# Patient Record
Sex: Male | Born: 1970
Health system: Southern US, Community
[De-identification: ages and names within clinical notes are randomized; demographics above are authoritative.]

## PROBLEM LIST (undated history)

## (undated) DIAGNOSIS — E119 Type 2 diabetes mellitus without complications: Secondary | ICD-10-CM

## (undated) DIAGNOSIS — E785 Hyperlipidemia, unspecified: Secondary | ICD-10-CM

## (undated) DIAGNOSIS — M1990 Unspecified osteoarthritis, unspecified site: Secondary | ICD-10-CM

## (undated) DIAGNOSIS — K219 Gastro-esophageal reflux disease without esophagitis: Secondary | ICD-10-CM

## (undated) DIAGNOSIS — F419 Anxiety disorder, unspecified: Secondary | ICD-10-CM

## (undated) DIAGNOSIS — I10 Essential (primary) hypertension: Secondary | ICD-10-CM

## (undated) DIAGNOSIS — J302 Other seasonal allergic rhinitis: Secondary | ICD-10-CM

## (undated) DIAGNOSIS — E669 Obesity, unspecified: Secondary | ICD-10-CM

## (undated) HISTORY — DX: Unspecified osteoarthritis, unspecified site: M19.90

## (undated) HISTORY — PX: APPENDECTOMY: SHX54

## (undated) HISTORY — DX: Anxiety disorder, unspecified: F41.9

## (undated) HISTORY — DX: Hyperlipidemia, unspecified: E78.5

## (undated) HISTORY — DX: Essential (primary) hypertension: I10

## (undated) HISTORY — DX: Type 2 diabetes mellitus without complications: E11.9

## (undated) HISTORY — DX: Other seasonal allergic rhinitis: J30.2

---

## 2000-10-28 ENCOUNTER — Observation Stay (HOSPITAL_COMMUNITY): Admission: EM | Admit: 2000-10-28 | Discharge: 2000-10-30 | Payer: Self-pay | Admitting: Emergency Medicine

## 2004-04-24 ENCOUNTER — Emergency Department (HOSPITAL_COMMUNITY): Admission: EM | Admit: 2004-04-24 | Discharge: 2004-04-24 | Payer: Self-pay | Admitting: Emergency Medicine
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
## 2015-10-12 DIAGNOSIS — I10 Essential (primary) hypertension: Secondary | ICD-10-CM | POA: Diagnosis not present

## 2015-11-04 DIAGNOSIS — E781 Pure hyperglyceridemia: Secondary | ICD-10-CM | POA: Insufficient documentation

## 2015-11-04 DIAGNOSIS — R739 Hyperglycemia, unspecified: Secondary | ICD-10-CM | POA: Insufficient documentation

## 2015-11-04 DIAGNOSIS — J301 Allergic rhinitis due to pollen: Secondary | ICD-10-CM | POA: Insufficient documentation

## 2015-11-04 DIAGNOSIS — M545 Low back pain, unspecified: Secondary | ICD-10-CM | POA: Insufficient documentation

## 2015-11-04 DIAGNOSIS — I152 Hypertension secondary to endocrine disorders: Secondary | ICD-10-CM | POA: Insufficient documentation

## 2015-11-04 DIAGNOSIS — I10 Essential (primary) hypertension: Secondary | ICD-10-CM | POA: Insufficient documentation

## 2015-11-04 DIAGNOSIS — E1159 Type 2 diabetes mellitus with other circulatory complications: Secondary | ICD-10-CM | POA: Insufficient documentation

## 2015-11-04 DIAGNOSIS — G8929 Other chronic pain: Secondary | ICD-10-CM | POA: Insufficient documentation

## 2016-01-06 DIAGNOSIS — E781 Pure hyperglyceridemia: Secondary | ICD-10-CM | POA: Diagnosis not present

## 2016-04-14 DIAGNOSIS — E781 Pure hyperglyceridemia: Secondary | ICD-10-CM | POA: Diagnosis not present

## 2016-04-14 DIAGNOSIS — M1A9XX0 Chronic gout, unspecified, without tophus (tophi): Secondary | ICD-10-CM | POA: Diagnosis not present

## 2016-04-14 DIAGNOSIS — I10 Essential (primary) hypertension: Secondary | ICD-10-CM | POA: Diagnosis not present

## 2016-10-17 ENCOUNTER — Ambulatory Visit (INDEPENDENT_AMBULATORY_CARE_PROVIDER_SITE_OTHER): Payer: BLUE CROSS/BLUE SHIELD | Admitting: Adult Health

## 2016-10-17 ENCOUNTER — Encounter: Payer: Self-pay | Admitting: Adult Health

## 2016-10-17 VITALS — BP 127/77 | HR 88 | Ht 72.25 in | Wt 342.8 lb

## 2016-10-17 DIAGNOSIS — R739 Hyperglycemia, unspecified: Secondary | ICD-10-CM

## 2016-10-17 DIAGNOSIS — R5383 Other fatigue: Secondary | ICD-10-CM | POA: Diagnosis not present

## 2016-10-17 DIAGNOSIS — E781 Pure hyperglyceridemia: Secondary | ICD-10-CM | POA: Diagnosis not present

## 2016-10-17 DIAGNOSIS — L259 Unspecified contact dermatitis, unspecified cause: Secondary | ICD-10-CM | POA: Diagnosis not present

## 2016-10-17 DIAGNOSIS — Z1321 Encounter for screening for nutritional disorder: Secondary | ICD-10-CM

## 2016-10-17 DIAGNOSIS — I10 Essential (primary) hypertension: Secondary | ICD-10-CM | POA: Diagnosis not present

## 2016-10-17 NOTE — Patient Instructions (Signed)
Heart-Healthy Eating Plan Many factors influence your heart health, including eating and exercise habits. Heart (coronary) risk increases with abnormal blood fat (lipid) levels. Heart-healthy meal planning includes limiting unhealthy fats, increasing healthy fats, and making other small dietary changes. This includes maintaining a healthy body weight to help keep lipid levels within a normal range. What is my plan? Your health care provider recommends that you:  Get no more than _________% of the total calories in your daily diet from fat.  Limit your intake of saturated fat to less than _________% of your total calories each day.  Limit the amount of cholesterol in your diet to less than _________ mg per day. What types of fat should I choose?  Choose healthy fats more often. Choose monounsaturated and polyunsaturated fats, such as olive oil and canola oil, flaxseeds, walnuts, almonds, and seeds.  Eat more omega-3 fats. Good choices include salmon, mackerel, sardines, tuna, flaxseed oil, and ground flaxseeds. Aim to eat fish at least two times each week.  Limit saturated fats. Saturated fats are primarily found in animal products, such as meats, butter, and cream. Plant sources of saturated fats include palm oil, palm kernel oil, and coconut oil.  Avoid foods with partially hydrogenated oils in them. These contain trans fats. Examples of foods that contain trans fats are stick margarine, some tub margarines, cookies, crackers, and other baked goods. What general guidelines do I need to follow?  Check food labels carefully to identify foods with trans fats or high amounts of saturated fat.  Fill one half of your plate with vegetables and green salads. Eat 4-5 servings of vegetables per day. A serving of vegetables equals 1 cup of raw leafy vegetables,  cup of raw or cooked cut-up vegetables, or  cup of vegetable juice.  Fill one fourth of your plate with whole grains. Look for the word  "whole" as the first word in the ingredient list.  Fill one fourth of your plate with lean protein foods.  Eat 4-5 servings of fruit per day. A serving of fruit equals one medium whole fruit,  cup of dried fruit,  cup of fresh, frozen, or canned fruit, or  cup of 100% fruit juice.  Eat more foods that contain soluble fiber. Examples of foods that contain this type of fiber are apples, broccoli, carrots, beans, peas, and barley. Aim to get 20-30 g of fiber per day.  Eat more home-cooked food and less restaurant, buffet, and fast food.  Limit or avoid alcohol.  Limit foods that are high in starch and sugar.  Avoid fried foods.  Cook foods by using methods other than frying. Baking, boiling, grilling, and broiling are all great options. Other fat-reducing suggestions include:  Removing the skin from poultry.  Removing all visible fats from meats.  Skimming the fat off of stews, soups, and gravies before serving them.  Steaming vegetables in water or broth.  Lose weight if you are overweight. Losing just 5-10% of your initial body weight can help your overall health and prevent diseases such as diabetes and heart disease.  Increase your consumption of nuts, legumes, and seeds to 4-5 servings per week. One serving of dried beans or legumes equals  cup after being cooked, one serving of nuts equals 1 ounces, and one serving of seeds equals  ounce or 1 tablespoon.  You may need to monitor your salt (sodium) intake, especially if you have high blood pressure. Talk with your health care provider or dietitian to get more  information about reducing sodium. What foods can I eat? Grains   Breads, including [place], white, pita, wheat, raisin, rye, oatmeal, and [place]. Tortillas that are neither fried nor made with lard or trans fat. Low-fat rolls, including hotdog and hamburger buns and English muffins. Biscuits. Muffins. Waffles. Pancakes. Light popcorn. Whole-grain cereals. Flatbread.  Melba toast. Pretzels. Breadsticks. Rusks. Low-fat snacks and crackers, including oyster, saltine, matzo, graham, animal, and rye. Rice and pasta, including [place] rice and those that are made with whole wheat. Vegetables  All vegetables. Fruits  All fruits, but limit coconut. Meats and Other Protein Sources  Lean, well-trimmed beef, veal, pork, and lamb. Chicken and [place] without skin. All fish and shellfish. Wild duck, rabbit, pheasant, and venison. Egg whites or low-cholesterol egg substitutes. Dried beans, peas, lentils, and tofu.Seeds and most nuts. Dairy  Low-fat or nonfat cheeses, including ricotta, string, and mozzarella. Skim or 1% milk that is liquid, powdered, or evaporated. Buttermilk that is made with low-fat milk. Nonfat or low-fat yogurt. Beverages  Mineral water. Diet carbonated beverages. Sweets and Desserts  Sherbets and fruit ices. Honey, jam, marmalade, jelly, and syrups. Meringues and gelatins. Pure sugar candy, such as hard candy, jelly beans, gumdrops, mints, marshmallows, and small amounts of dark chocolate. [employer] Eat all sweets and desserts in moderation. Fats and Oils  Nonhydrogenated (trans-free) margarines. Vegetable oils, including soybean, sesame, sunflower, olive, peanut, safflower, corn, canola, and cottonseed. Salad dressings or mayonnaise that are made with a vegetable oil. Limit added fats and oils that you use for cooking, baking, salads, and as spreads. Other  Cocoa powder. Coffee and tea. All seasonings and condiments. The items listed above may not be a complete list of recommended foods or beverages. Contact your dietitian for more options.  What foods are not recommended? Grains  Breads that are made with saturated or trans fats, oils, or whole milk. Croissants. Butter rolls. Cheese breads. Sweet rolls. Donuts. Buttered popcorn. Chow mein noodles. High-fat crackers, such as cheese or butter crackers. Meats and Other Protein Sources  Fatty  meats, such as hotdogs, short ribs, sausage, spareribs, bacon, ribeye roast or steak, and mutton. High-fat deli meats, such as salami and bologna. Caviar. Domestic duck and goose. Organ meats, such as kidney, liver, sweetbreads, brains, gizzard, chitterlings, and heart. Dairy  Cream, sour cream, cream cheese, and creamed cottage cheese. Whole milk cheeses, including blue (bleu), Monterey Jack, [place], [place], American, [place], Swiss, [place], [place], and [place]. Whole or 2% milk that is liquid, evaporated, or condensed. Whole buttermilk. Cream sauce or high-fat cheese sauce. Yogurt that is made from whole milk. Beverages  Regular sodas and drinks with added sugar. Sweets and Desserts  Frosting. Pudding. Cookies. Cakes other than angel food cake. Candy that has milk chocolate or white chocolate, hydrogenated fat, butter, coconut, or unknown ingredients. Buttered syrups. Full-fat ice cream or ice cream drinks. Fats and Oils  Gravy that has suet, meat fat, or shortening. Cocoa butter, hydrogenated oils, palm oil, coconut oil, palm kernel oil. These can often be found in baked products, candy, fried foods, nondairy creamers, and whipped toppings. Solid fats and shortenings, including bacon fat, salt pork, lard, and butter. Nondairy cream substitutes, such as coffee creamers and sour cream substitutes. Salad dressings that are made of unknown oils, cheese, or sour cream. The items listed above may not be a complete list of foods and beverages to avoid. Contact your dietitian for more information.  This information is not intended to replace advice given to you by  your health care provider. Make sure you discuss any questions you have with your health care provider. Document Released: 04/04/2008 Document Revised: 01/14/2016 Document Reviewed: 12/18/2013 Elsevier Interactive Patient Education  2017 [employer]. Hypertension Hypertension, commonly called high blood pressure, is when the force of blood  pumping through the arteries is too strong. The arteries are the blood vessels that carry blood from the heart throughout the body. Hypertension forces the heart to work harder to pump blood and may cause arteries to become narrow or stiff. Having untreated or uncontrolled hypertension can cause heart attacks, strokes, kidney disease, and other problems. A blood pressure reading consists of a higher number over a lower number. Ideally, your blood pressure should be below 120/80. The first ("top") number is called the systolic pressure. It is a measure of the pressure in your arteries as your heart beats. The second ("bottom") number is called the diastolic pressure. It is a measure of the pressure in your arteries as the heart relaxes. What are the causes? The cause of this condition is not known. What increases the risk? Some risk factors for high blood pressure are under your control. Others are not. Factors you can change   Smoking.  Having type 2 diabetes mellitus, high cholesterol, or both.  Not getting enough exercise or physical activity.  Being overweight.  Having too much fat, sugar, calories, or salt (sodium) in your diet.  Drinking too much alcohol. Factors that are difficult or impossible to change   Having chronic kidney disease.  Having a family history of high blood pressure.  Age. Risk increases with age.  Race. You may be at higher risk if you are African-American.  Gender. Men are at higher risk than women before age 30. After age 66, women are at higher risk than men.  Having obstructive sleep apnea.  Stress. What are the signs or symptoms? Extremely high blood pressure (hypertensive crisis) may cause:  Headache.  Anxiety.  Shortness of breath.  Nosebleed.  Nausea and vomiting.  Severe chest pain.  Jerky movements you cannot control (seizures). How is this diagnosed? This condition is diagnosed by measuring your blood pressure while you are  seated, with your arm resting on a surface. The cuff of the blood pressure monitor will be placed directly against the skin of your upper arm at the level of your heart. It should be measured at least twice using the same arm. Certain conditions can cause a difference in blood pressure between your right and left arms. Certain factors can cause blood pressure readings to be lower or higher than normal (elevated) for a short period of time:  When your blood pressure is higher when you are in a health care provider's office than when you are at home, this is called white coat hypertension. Most people with this condition do not need medicines.  When your blood pressure is higher at home than when you are in a health care provider's office, this is called masked hypertension. Most people with this condition may need medicines to control blood pressure. If you have a high blood pressure reading during one visit or you have normal blood pressure with other risk factors:  You may be asked to return on a different day to have your blood pressure checked again.  You may be asked to monitor your blood pressure at home for 1 week or longer. If you are diagnosed with hypertension, you may have other blood or imaging tests to help your health care  provider understand your overall risk for other conditions. How is this treated? This condition is treated by making healthy lifestyle changes, such as eating healthy foods, exercising more, and reducing your alcohol intake. Your health care provider may prescribe medicine if lifestyle changes are not enough to get your blood pressure under control, and if:  Your systolic blood pressure is above 130.  Your diastolic blood pressure is above 80. Your personal target blood pressure may vary depending on your medical conditions, your age, and other factors. Follow these instructions at home: Eating and drinking   Eat a diet that is high in fiber and potassium, and  low in sodium, added sugar, and fat. An example eating plan is called the DASH (Dietary Approaches to Stop Hypertension) diet. To eat this way:  Eat plenty of fresh fruits and vegetables. Try to fill half of your plate at each meal with fruits and vegetables.  Eat whole grains, such as whole wheat pasta, brown rice, or whole grain bread. Fill about one quarter of your plate with whole grains.  Eat or drink low-fat dairy products, such as skim milk or low-fat yogurt.  Avoid fatty cuts of meat, processed or cured meats, and poultry with skin. Fill about one quarter of your plate with lean proteins, such as fish, chicken without skin, beans, eggs, and tofu.  Avoid premade and processed foods. These tend to be higher in sodium, added sugar, and fat.  Reduce your daily sodium intake. Most people with hypertension should eat less than 1,500 mg of sodium a day.  Limit alcohol intake to no more than 1 drink a day for nonpregnant women and 2 drinks a day for men. One drink equals 12 oz of beer, 5 oz of wine, or 1 oz of hard liquor. Lifestyle   Work with your health care provider to maintain a healthy body weight or to lose weight. Ask what an ideal weight is for you.  Get at least 30 minutes of exercise that causes your heart to beat faster (aerobic exercise) most days of the week. Activities may include walking, swimming, or biking.  Include exercise to strengthen your muscles (resistance exercise), such as pilates or lifting weights, as part of your weekly exercise routine. Try to do these types of exercises for 30 minutes at least 3 days a week.  Do not use any products that contain nicotine or tobacco, such as cigarettes and e-cigarettes. If you need help quitting, ask your health care provider.  Monitor your blood pressure at home as told by your health care provider.  Keep all follow-up visits as told by your health care provider. This is important. Medicines   Take over-the-counter and  prescription medicines only as told by your health care provider. Follow directions carefully. Blood pressure medicines must be taken as prescribed.  Do not skip doses of blood pressure medicine. Doing this puts you at risk for problems and can make the medicine less effective.  Ask your health care provider about side effects or reactions to medicines that you should watch for. Contact a health care provider if:  You think you are having a reaction to a medicine you are taking.  You have headaches that keep coming back (recurring).  You feel dizzy.  You have swelling in your ankles.  You have trouble with your vision. Get help right away if:  You develop a severe headache or confusion.  You have unusual weakness or numbness.  You feel faint.  You have severe  pain in your chest or abdomen.  You vomit repeatedly.  You have trouble breathing. Summary  Hypertension is when the force of blood pumping through your arteries is too strong. If this condition is not controlled, it may put you at risk for serious complications.  Your personal target blood pressure may vary depending on your medical conditions, your age, and other factors. For most people, a normal blood pressure is less than 120/80.  Hypertension is treated with lifestyle changes, medicines, or a combination of both. Lifestyle changes include weight loss, eating a healthy, low-sodium diet, exercising more, and limiting alcohol. This information is not intended to replace advice given to you by your health care provider. Make sure you discuss any questions you have with your health care provider. Document Released: 06/26/2005 Document Revised: 05/24/2016 Document Reviewed: 05/24/2016 Elsevier Interactive Patient Education  2017 [place] all medications as directed. Please schedule fasting lab nurse visit at your convenience. Annual follow-up, sooner if needed.

## 2016-10-17 NOTE — Assessment & Plan Note (Signed)
Reduce sugar/CHO intake. Increase regular movement. Will check A1c, BS when labs are obtained.

## 2016-10-17 NOTE — Assessment & Plan Note (Signed)
Reduce saturated fat intake. Increase regular movement, at least 27mins 5 times week. Will check lipid panel when labs are obtained.

## 2016-10-17 NOTE — Assessment & Plan Note (Signed)
Apply hydrocortisone cream to site BID. OTC Claritin am, OTC Benadryl QHS PRN pruritis.

## 2016-10-17 NOTE — Assessment & Plan Note (Signed)
>>  ASSESSMENT AND PLAN FOR BENIGN ESSENTIAL HYPERTENSION WRITTEN ON 10/17/2016  4:56 PM BY BESS, KATY D, NP  Continue daily amlodipine 5mg  and lisinopril 40mg . Heart Healthy Diet.

## 2016-10-17 NOTE — Assessment & Plan Note (Signed)
Continue daily amlodipine 5mg  and lisinopril 40mg . Heart Healthy Diet.

## 2016-10-17 NOTE — Progress Notes (Signed)
Subjective:    Patient ID: Eddie Hancock, male    DOB: 1971/03/06, 46 y.o.   MRN: [id number]  HPI:  Eddie Hancock presents to establish as a new pt.  He is very pleasant 46 year old male.  PMH:  HTN, Morbid obesity, Hyperlipidemia, lumbago,and gout.  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  He is married with two girls (ages 60 and 68).  He was previously on Fenofibrate that he did not tolerate well and now is taking Fish Oil.  He reports social ETOH and tobacco use.  He reports "moving al day and lifting heavy parts" while working, denies formal Psychologist, counselling.  He reports his ideal weight is around 240-250 #.    Patient Care Team    Relationship Specialty Notifications Start End  Eddie Aquas, NP PCP - General Family Medicine  10/17/16     Patient Active Problem List   Diagnosis Date Noted  . Benign essential hypertension 11/04/2015  . Chronic low back pain without sciatica 11/04/2015  . Hyperglycemia 11/04/2015  . Hypertriglyceridemia 11/04/2015  . Seasonal allergic rhinitis due to pollen 11/04/2015     Past Medical History:  Diagnosis Date  . Diabetes mellitus without complication ([place])   . Hypertension      Past Surgical History:  Procedure Laterality Date  . APPENDECTOMY       Family History  Problem Relation Age of Onset  . Hypertension Mother   . Diabetes Daughter   . Learning disabilities Maternal Grandmother   . Learning disabilities Maternal Grandfather      History  Drug Use No     History  Alcohol Use  . Yes    Comment: socially     History  Smoking Status  . Current Some Day Smoker  Smokeless Tobacco  . Never Used     Outpatient Encounter Prescriptions as of 10/17/2016  Medication Sig  . allopurinol (ZYLOPRIM) 300 MG tablet Take 300 mg by mouth daily.  Marland Kitchen amLODipine (NORVASC) 5 MG tablet Take 5 mg by mouth daily.  . cyclobenzaprine (FLEXERIL) 10 MG tablet Take 1 tablet by mouth 3 (three) times daily as needed.    . indomethacin (INDOCIN) 50 MG capsule Take 50 mg by mouth 3 (three) times daily.  Marland Kitchen lisinopril (PRINIVIL,ZESTRIL) 40 MG tablet Take 40 mg by mouth daily.  Marland Kitchen omega-3 acid ethyl esters (LOVAZA) 1 g capsule Take 2 capsules by mouth 2 (two) times daily.   No facility-administered encounter medications on file as of 10/17/2016.     Allergies: Cetirizine  Body mass index is 46.17 kg/m.  Blood pressure 127/77, pulse 88, height 6' 0.25" (1.835 m), weight (!) 342 lb 12.8 oz (155.5 kg).    Review of Systems  Constitutional: Negative for activity change, appetite change, chills, diaphoresis, fatigue, fever and unexpected weight change.  HENT: Negative for congestion.   Eyes: Negative for visual disturbance.  Respiratory: Negative for cough, chest tightness, shortness of breath, wheezing and stridor.   Cardiovascular: Negative for chest pain, palpitations and leg swelling.  Gastrointestinal: Negative for abdominal distention, abdominal pain, blood in stool, constipation, diarrhea, nausea and vomiting.  Endocrine: Negative for cold intolerance, heat intolerance, polydipsia, polyphagia and polyuria.  Genitourinary: Negative for difficulty urinating and flank pain.  Musculoskeletal: Positive for arthralgias, back pain and myalgias. Negative for gait problem, joint swelling, neck pain and neck stiffness.  Skin: Negative for color change, pallor, rash and wound.  Allergic/Immunologic: Negative for immunocompromised state.  Neurological:  Negative for dizziness, tremors, weakness and light-headedness.  Hematological: Does not bruise/bleed easily.  Psychiatric/Behavioral: Negative for agitation, dysphoric mood and sleep disturbance. The patient is not nervous/anxious.        Objective:   Physical Exam  Constitutional: He is oriented to person, place, and time. He appears well-developed and well-nourished. No distress.  HENT:  Head: Normocephalic and atraumatic.  Right Ear: Hearing, tympanic  membrane and external ear normal. No decreased hearing is noted.  Left Ear: Hearing, tympanic membrane and external ear normal. No decreased hearing is noted.  Nose: Nose normal. Right sinus exhibits no maxillary sinus tenderness and no frontal sinus tenderness. Left sinus exhibits no maxillary sinus tenderness and no frontal sinus tenderness.  Mouth/Throat: Uvula is midline.  Eyes: Conjunctivae are normal. Pupils are equal, round, and reactive to light.  Neck: Normal range of motion. Neck supple.  Cardiovascular: Normal rate, regular rhythm, normal heart sounds and intact distal pulses.   Pulmonary/Chest: Effort normal and breath sounds normal. No respiratory distress. He has no wheezes. He has no rales. He exhibits no tenderness.  Abdominal: Soft. Bowel sounds are normal. He exhibits no distension and no mass. There is no tenderness. There is no rebound, no guarding and no CVA tenderness.    Protuberant abdomen. Two areas of contact dermatitis noted. No open tissue/drainage/excessive warmth noted.  Musculoskeletal: Normal range of motion.  Lymphadenopathy:    He has no cervical adenopathy.  Neurological: He is alert and oriented to person, place, and time.  Skin: Skin is warm and dry. No rash noted. He is not diaphoretic. No erythema. No pallor.  Psychiatric: He has a normal mood and affect. His behavior is normal. Judgment and thought content normal.  Nursing note and vitals reviewed.         Assessment & Plan:   1. Benign essential hypertension   2. Hyperglycemia   3. Hypertriglyceridemia   4. Encounter for vitamin deficiency screening   5. Other fatigue   6. Contact dermatitis, unspecified contact dermatitis type, unspecified trigger     Hyperglycemia Reduce sugar/CHO intake. Increase regular movement. Will check A1c, BS when labs are obtained.  Benign essential hypertension Continue daily amlodipine 5mg  and lisinopril 40mg . Heart Healthy  Diet.  Hypertriglyceridemia Reduce saturated fat intake. Increase regular movement, at least 52mins 5 times week. Will check lipid panel when labs are obtained.  Contact dermatitis Apply hydrocortisone cream to site BID. OTC Claritin am, OTC Benadryl QHS PRN pruritis.      FOLLOW-UP:  Return in about 1 year (around 10/17/2017) for Regular Follow Up.

## 2016-10-30 ENCOUNTER — Other Ambulatory Visit (INDEPENDENT_AMBULATORY_CARE_PROVIDER_SITE_OTHER): Payer: BLUE CROSS/BLUE SHIELD

## 2016-10-30 DIAGNOSIS — E781 Pure hyperglyceridemia: Secondary | ICD-10-CM | POA: Diagnosis not present

## 2016-10-30 DIAGNOSIS — Z1321 Encounter for screening for nutritional disorder: Secondary | ICD-10-CM | POA: Diagnosis not present

## 2016-10-30 DIAGNOSIS — R5383 Other fatigue: Secondary | ICD-10-CM | POA: Diagnosis not present

## 2016-10-30 DIAGNOSIS — R739 Hyperglycemia, unspecified: Secondary | ICD-10-CM | POA: Diagnosis not present

## 2016-11-01 LAB — CBC WITH DIFFERENTIAL/PLATELET
BASOS ABS: 0 10*3/uL (ref 0.0–0.2)
Basos: 1 %
EOS (ABSOLUTE): 0.3 10*3/uL (ref 0.0–0.4)
Eos: 4 %
Hematocrit: 46.1 % (ref 37.5–51.0)
Hemoglobin: 16.2 g/dL (ref 13.0–17.7)
IMMATURE GRANS (ABS): 0 10*3/uL (ref 0.0–0.1)
Immature Granulocytes: 0 %
LYMPHS: 39 %
Lymphocytes Absolute: 2.5 10*3/uL (ref 0.7–3.1)
MCH: 31.4 pg (ref 26.6–33.0)
MCHC: 35.1 g/dL (ref 31.5–35.7)
MCV: 89 fL (ref 79–97)
MONOS ABS: 0.6 10*3/uL (ref 0.1–0.9)
Monocytes: 9 %
NEUTROS PCT: 47 %
Neutrophils Absolute: 3 10*3/uL (ref 1.4–7.0)
Platelets: 221 10*3/uL (ref 150–379)
RBC: 5.16 x10E6/uL (ref 4.14–5.80)
RDW: 13.4 % (ref 12.3–15.4)
WBC: 6.5 10*3/uL (ref 3.4–10.8)

## 2016-11-01 LAB — COMPREHENSIVE METABOLIC PANEL
A/G RATIO: 2 (ref 1.2–2.2)
ALK PHOS: 74 IU/L (ref 39–117)
ALT: 56 IU/L — ABNORMAL HIGH (ref 0–44)
AST: 35 IU/L (ref 0–40)
Albumin: 4.3 g/dL (ref 3.5–5.5)
BUN / CREAT RATIO: 15 (ref 9–20)
BUN: 13 mg/dL (ref 6–24)
Bilirubin Total: 0.6 mg/dL (ref 0.0–1.2)
CO2: 19 mmol/L (ref 18–29)
Calcium: 9.2 mg/dL (ref 8.7–10.2)
Chloride: 100 mmol/L (ref 96–106)
Creatinine, Ser: 0.89 mg/dL (ref 0.76–1.27)
GFR calc Af Amer: 119 mL/min/{1.73_m2} (ref >59)
GFR calc non Af Amer: 103 mL/min/{1.73_m2} (ref >59)
GLOBULIN, TOTAL: 2.2 g/dL (ref 1.5–4.5)
Glucose: 265 mg/dL — ABNORMAL HIGH (ref 65–99)
POTASSIUM: 5.2 mmol/L (ref 3.5–5.2)
SODIUM: 138 mmol/L (ref 134–144)
Total Protein: 6.5 g/dL (ref 6.0–8.5)

## 2016-11-01 LAB — VITAMIN D 25 HYDROXY (VIT D DEFICIENCY, FRACTURES)

## 2016-11-01 LAB — TSH: TSH: 3.49 u[IU]/mL (ref 0.450–4.500)

## 2016-11-01 LAB — HEMOGLOBIN A1C
ESTIMATED AVERAGE GLUCOSE: 189 mg/dL
HEMOGLOBIN A1C: 8.2 % — AB (ref 4.8–5.6)

## 2016-11-01 LAB — LIPID PANEL
CHOL/HDL RATIO: 15.3 ratio — AB (ref 0.0–5.0)
Cholesterol, Total: 244 mg/dL — ABNORMAL HIGH (ref 100–199)
HDL: 16 mg/dL — ABNORMAL LOW (ref >39)
Triglycerides: 2187 mg/dL (ref 0–149)

## 2016-11-06 ENCOUNTER — Other Ambulatory Visit: Payer: Self-pay | Admitting: Adult Health

## 2016-11-06 ENCOUNTER — Telehealth: Payer: Self-pay | Admitting: Adult Health

## 2016-11-06 DIAGNOSIS — E1165 Type 2 diabetes mellitus with hyperglycemia: Secondary | ICD-10-CM

## 2016-11-06 DIAGNOSIS — R739 Hyperglycemia, unspecified: Secondary | ICD-10-CM

## 2016-11-06 DIAGNOSIS — E781 Pure hyperglyceridemia: Secondary | ICD-10-CM

## 2016-11-06 MED ORDER — ATORVASTATIN CALCIUM 10 MG PO TABS: 20.0000 mg | ORAL_TABLET | Freq: Every day | ORAL | 3 refills | Status: DC

## 2016-11-06 MED ORDER — METFORMIN HCL 500 MG PO TABS: 500.0000 mg | ORAL_TABLET | Freq: Two times a day (BID) | ORAL | 3 refills | Status: DC

## 2016-11-06 MED ORDER — EZETIMIBE 10 MG PO TABS: 10.0000 mg | ORAL_TABLET | Freq: Every day | ORAL | 3 refills | Status: DC

## 2016-11-06 NOTE — Telephone Encounter (Signed)
-----   Message from Fonnie Mu, [place] sent at 11/06/2016  3:36 PM EDT ----- 11/06/16  Pt informed of results and request to schedule OV to discuss.  Pt stated that he is currently so "strapped" financially d/t his wife multiple medical issues and would like to know if we can forego the appointment and prescribe medications.  Pt states that he is "very familiar" with the diagnosis of diabetes since his daughter is type 1 and both of his parents are type 2.  Please advise.  [name], CMA

## 2016-11-06 NOTE — Progress Notes (Signed)
Started in new meds, will f/u in 2 weeks vis McChart.  Will re-check labs in 90 days.

## 2016-11-07 ENCOUNTER — Telehealth: Payer: Self-pay | Admitting: Adult Health

## 2016-11-07 ENCOUNTER — Other Ambulatory Visit: Payer: Self-pay | Admitting: Adult Health

## 2016-11-07 DIAGNOSIS — M109 Gout, unspecified: Secondary | ICD-10-CM

## 2016-11-07 MED ORDER — ALLOPURINOL 300 MG PO TABS: 300.0000 mg | ORAL_TABLET | Freq: Every day | ORAL | 3 refills | Status: DC

## 2016-11-07 NOTE — Telephone Encounter (Signed)
Patient states he is on allopurinol and is out. Patient requesting a refill. Stated to patient that it was not on med list nor has been prescribed by any provider here. Told patient I will send note to doctor and nurse.

## 2016-11-07 NOTE — Telephone Encounter (Signed)
Med refilled. Thanks!Valetta Fuller

## 2016-11-07 NOTE — Progress Notes (Unsigned)
90

## 2016-11-08 ENCOUNTER — Telehealth: Payer: Self-pay | Admitting: Adult Health

## 2016-11-08 ENCOUNTER — Other Ambulatory Visit: Payer: Self-pay

## 2016-11-08 MED ORDER — CONTOUR NEXT ONE KIT: 1.0000 | PACK | Freq: Two times a day (BID) | 0 refills | Status: DC

## 2016-11-08 MED ORDER — GLUCOSE BLOOD VI STRP: ORAL_STRIP | 12 refills | Status: DC

## 2016-11-08 NOTE — Telephone Encounter (Signed)
Pt informed

## 2016-11-08 NOTE — Telephone Encounter (Signed)
Pt called to return [place]-- Pls call back @ [phone]. --Dion Body

## 2016-11-08 NOTE — Telephone Encounter (Signed)
Patient called stating that he called his insurance company and the recommended glucose meter they cover is the [employer] Next

## 2016-11-08 NOTE — Telephone Encounter (Signed)
Afternoon Eddie Hancock, Can you please order [employer] Next glucometer and test strips for Ms Mcquiston. Also please have Mr. Grzelak call Dorothea Ogle this afternoon (when he has time) to establish his MyChart acct.   This is so we can closely manage his Diabetes and HL. Thanks! Valetta Fuller

## 2016-11-08 NOTE — Telephone Encounter (Signed)
Rx sent to pharmacy per Lillard Anes, [facility]. Called pt and LVM for him to contact the office.

## 2016-11-14 ENCOUNTER — Other Ambulatory Visit: Payer: Self-pay | Admitting: Adult Health

## 2016-11-14 MED ORDER — LISINOPRIL 40 MG PO TABS: 40.0000 mg | ORAL_TABLET | Freq: Every day | ORAL | 0 refills | Status: DC

## 2016-11-14 NOTE — Telephone Encounter (Signed)
Patient is requesting a refill of the lisinopril 40 mg

## 2016-11-14 NOTE — Telephone Encounter (Signed)
We have not prescribed these medications for the patient previously.  Please review and refill if appropriate.  T. Nelson, CMA  

## 2016-11-14 NOTE — Addendum Note (Signed)
Addended by: Fonnie Mu on: 11/14/2016 01:36 PM   Modules accepted: Orders

## 2016-12-25 ENCOUNTER — Other Ambulatory Visit: Payer: Self-pay | Admitting: Adult Health

## 2016-12-25 ENCOUNTER — Telehealth: Payer: Self-pay | Admitting: Adult Health

## 2016-12-25 DIAGNOSIS — E781 Pure hyperglyceridemia: Secondary | ICD-10-CM

## 2016-12-25 MED ORDER — OMEGA-3-ACID ETHYL ESTERS 1 G PO CAPS: 2.0000 g | ORAL_CAPSULE | Freq: Two times a day (BID) | ORAL | 2 refills | Status: DC

## 2016-12-25 NOTE — Telephone Encounter (Signed)
Patient stated he needs to get his Lovaza filled. He did state he has not had it filled since becoming a patient here. Thanks

## 2016-12-25 NOTE — Telephone Encounter (Signed)
Left message informing patient lovaza was sent to pharmacy per Mina Marble, NP.

## 2016-12-25 NOTE — Telephone Encounter (Signed)
Good Afternoon, Please call Mr. Colucci and share that I sent in RF for Lovaza. Thanks! Valetta Fuller

## 2017-01-08 ENCOUNTER — Other Ambulatory Visit: Payer: Self-pay | Admitting: Adult Health

## 2017-02-02 ENCOUNTER — Other Ambulatory Visit: Payer: Self-pay | Admitting: Adult Health

## 2017-02-11 ENCOUNTER — Other Ambulatory Visit: Payer: Self-pay | Admitting: Adult Health

## 2017-02-12 ENCOUNTER — Encounter: Payer: Self-pay | Admitting: Adult Health

## 2017-02-12 ENCOUNTER — Ambulatory Visit (INDEPENDENT_AMBULATORY_CARE_PROVIDER_SITE_OTHER): Payer: BLUE CROSS/BLUE SHIELD | Admitting: Adult Health

## 2017-02-12 VITALS — BP 108/75 | HR 101 | Ht 72.25 in | Wt 336.2 lb

## 2017-02-12 DIAGNOSIS — R739 Hyperglycemia, unspecified: Secondary | ICD-10-CM

## 2017-02-12 DIAGNOSIS — E781 Pure hyperglyceridemia: Secondary | ICD-10-CM | POA: Diagnosis not present

## 2017-02-12 DIAGNOSIS — I10 Essential (primary) hypertension: Secondary | ICD-10-CM | POA: Diagnosis not present

## 2017-02-12 MED ORDER — METFORMIN HCL 500 MG PO TABS: 500.0000 mg | ORAL_TABLET | Freq: Three times a day (TID) | ORAL | 3 refills | Status: DC

## 2017-02-12 MED ORDER — GLUCOSE BLOOD VI STRP: ORAL_STRIP | 4 refills | Status: DC

## 2017-02-12 MED ORDER — LISINOPRIL 40 MG PO TABS: 40.0000 mg | ORAL_TABLET | Freq: Every day | ORAL | 2 refills | Status: DC

## 2017-02-12 MED ORDER — AMLODIPINE BESYLATE 5 MG PO TABS: 5.0000 mg | ORAL_TABLET | Freq: Every day | ORAL | 0 refills | Status: DC

## 2017-02-12 NOTE — Assessment & Plan Note (Signed)
BP at goal 108/75 Continue Lisinopril 40mg  and Amlodipine 5mg  daily. He has completely stopped tobacco/EOTH use-GREAT!

## 2017-02-12 NOTE — Patient Instructions (Signed)
Heart-Healthy Eating Plan Many factors influence your heart health, including eating and exercise habits. Heart (coronary) risk increases with abnormal blood fat (lipid) levels. Heart-healthy meal planning includes limiting unhealthy fats, increasing healthy fats, and making other small dietary changes. This includes maintaining a healthy body weight to help keep lipid levels within a normal range. What is my plan? Your health care provider recommends that you:  Get no more than _________% of the total calories in your daily diet from fat.  Limit your intake of saturated fat to less than _________% of your total calories each day.  Limit the amount of cholesterol in your diet to less than _________ mg per day.  What types of fat should I choose?  Choose healthy fats more often. Choose monounsaturated and polyunsaturated fats, such as olive oil and canola oil, flaxseeds, walnuts, almonds, and seeds.  Eat more omega-3 fats. Good choices include salmon, mackerel, sardines, tuna, flaxseed oil, and ground flaxseeds. Aim to eat fish at least two times each week.  Limit saturated fats. Saturated fats are primarily found in animal products, such as meats, butter, and cream. Plant sources of saturated fats include palm oil, palm kernel oil, and coconut oil.  Avoid foods with partially hydrogenated oils in them. These contain trans fats. Examples of foods that contain trans fats are stick margarine, some tub margarines, cookies, crackers, and other baked goods. What general guidelines do I need to follow?  Check food labels carefully to identify foods with trans fats or high amounts of saturated fat.  Fill one half of your plate with vegetables and green salads. Eat 4-5 servings of vegetables per day. A serving of vegetables equals 1 cup of raw leafy vegetables,  cup of raw or cooked cut-up vegetables, or  cup of vegetable juice.  Fill one fourth of your plate with whole grains. Look for the word  "whole" as the first word in the ingredient list.  Fill one fourth of your plate with lean protein foods.  Eat 4-5 servings of fruit per day. A serving of fruit equals one medium whole fruit,  cup of dried fruit,  cup of fresh, frozen, or canned fruit, or  cup of 100% fruit juice.  Eat more foods that contain soluble fiber. Examples of foods that contain this type of fiber are apples, broccoli, carrots, beans, peas, and barley. Aim to get 20-30 g of fiber per day.  Eat more home-cooked food and less restaurant, buffet, and fast food.  Limit or avoid alcohol.  Limit foods that are high in starch and sugar.  Avoid fried foods.  Cook foods by using methods other than frying. Baking, boiling, grilling, and broiling are all great options. Other fat-reducing suggestions include: ? Removing the skin from poultry. ? Removing all visible fats from meats. ? Skimming the fat off of stews, soups, and gravies before serving them. ? Steaming vegetables in water or broth.  Lose weight if you are overweight. Losing just 5-10% of your initial body weight can help your overall health and prevent diseases such as diabetes and heart disease.  Increase your consumption of nuts, legumes, and seeds to 4-5 servings per week. One serving of dried beans or legumes equals  cup after being cooked, one serving of nuts equals 1 ounces, and one serving of seeds equals  ounce or 1 tablespoon.  You may need to monitor your salt (sodium) intake, especially if you have high blood pressure. Talk with your health care provider or dietitian to get  more information about reducing sodium. What foods can I eat? Grains  Breads, including [place], white, pita, wheat, raisin, rye, oatmeal, and [place]. Tortillas that are neither fried nor made with lard or trans fat. Low-fat rolls, including hotdog and hamburger buns and English muffins. Biscuits. Muffins. Waffles. Pancakes. Light popcorn. Whole-grain cereals. Flatbread.  Melba toast. Pretzels. Breadsticks. Rusks. Low-fat snacks and crackers, including oyster, saltine, matzo, graham, animal, and rye. Rice and pasta, including brown rice and those that are made with whole wheat. Vegetables All vegetables. Fruits All fruits, but limit coconut. Meats and Other Protein Sources Lean, well-trimmed beef, veal, pork, and lamb. Chicken and [place] without skin. All fish and shellfish. Wild duck, rabbit, pheasant, and venison. Egg whites or low-cholesterol egg substitutes. Dried beans, peas, lentils, and tofu.Seeds and most nuts. Dairy Low-fat or nonfat cheeses, including ricotta, string, and mozzarella. Skim or 1% milk that is liquid, powdered, or evaporated. Buttermilk that is made with low-fat milk. Nonfat or low-fat yogurt. Beverages Mineral water. Diet carbonated beverages. Sweets and Desserts Sherbets and fruit ices. Honey, jam, marmalade, jelly, and syrups. Meringues and gelatins. Pure sugar candy, such as hard candy, jelly beans, gumdrops, mints, marshmallows, and small amounts of dark chocolate. [employer] Eat all sweets and desserts in moderation. Fats and Oils Nonhydrogenated (trans-free) margarines. Vegetable oils, including soybean, sesame, sunflower, olive, peanut, safflower, corn, canola, and cottonseed. Salad dressings or mayonnaise that are made with a vegetable oil. Limit added fats and oils that you use for cooking, baking, salads, and as spreads. Other Cocoa powder. Coffee and tea. All seasonings and condiments. The items listed above may not be a complete list of recommended foods or beverages. Contact your dietitian for more options. What foods are not recommended? Grains Breads that are made with saturated or trans fats, oils, or whole milk. Croissants. Butter rolls. Cheese breads. Sweet rolls. Donuts. Buttered popcorn. Chow mein noodles. High-fat crackers, such as cheese or butter crackers. Meats and Other Protein Sources Fatty meats, such  as hotdogs, short ribs, sausage, spareribs, bacon, ribeye roast or steak, and mutton. High-fat deli meats, such as salami and bologna. Caviar. Domestic duck and goose. Organ meats, such as kidney, liver, sweetbreads, brains, gizzard, chitterlings, and heart. Dairy Cream, sour cream, cream cheese, and creamed cottage cheese. Whole milk cheeses, including blue (bleu), Monte[place] Jack, [place], [place], [place], [place], [place], [place], [place], and [place]. Whole or 2% milk that is liquid, evaporated, or condensed. Whole buttermilk. Cream sauce or high-fat cheese sauce. Yogurt that is made from whole milk. Beverages Regular sodas and drinks with added sugar. Sweets and Desserts Frosting. Pudding. Cookies. Cakes other than angel food cake. Candy that has milk chocolate or white chocolate, hydrogenated fat, butter, coconut, or unknown ingredients. Buttered syrups. Full-fat ice cream or ice cream drinks. Fats and Oils Gravy that has suet, meat fat, or shortening. Cocoa butter, hydrogenated oils, palm oil, coconut oil, palm kernel oil. These can often be found in baked products, candy, fried foods, nondairy creamers, and whipped toppings. Solid fats and shortenings, including bacon fat, salt pork, lard, and butter. Nondairy cream substitutes, such as coffee creamers and sour cream substitutes. Salad dressings that are made of unknown oils, cheese, or sour cream. The items listed above may not be a complete list of foods and beverages to avoid. Contact your dietitian for more information. This information is not intended to replace advice given to you by your health care provider. Make sure you discuss any questions you have with your health care  provider. Document Released: 04/04/2008 Document Revised: 01/14/2016 Document Reviewed: 12/18/2013 Elsevier Interactive Patient Education  2017 [place] all medications as directed, with one exception-increase Metformin 500mg  to three times daily with  meals. GREAT JOB ON THE HEALTHY EATING AND REGULAR EXERCISE! We will call you when lab results are available and will make any medication adjustments then. Follow-up in  4 months/sooner if needed.

## 2017-02-12 NOTE — Assessment & Plan Note (Addendum)
Am BS running 160-170s despite low CHO intake and regular exercise. Increased Metformin 500mg  from BID to TID Will check A1c next week and adjust medications as indicated.

## 2017-02-12 NOTE — Assessment & Plan Note (Signed)
Continue heart healthy diet and regular exercise-16mins elliptical 5 days a week. Continue Atorvastatin and Zetia as directed. Will check fasting labs, CMP, A1c next week and will adjust meds as indicated.

## 2017-02-12 NOTE — Progress Notes (Signed)
Subjective:    Patient ID: Eddie Hancock, male    DOB: 1971-07-07, 46 y.o.   MRN: [id number]  HPI:  Eddie Hancock is here for f/u: HTN.  He has been compliant on all medications and denies SE.  He has been exercising 5 days a week-40 mins on elliptical machine in basement.  He has dramatically reduced saturated fat/cho/sguar from diet and completely eliminated tobacco/EOTH.  He reports am bs 160-170s, despite low cho/cal meals in evening. He denies episodes of hypoglycemia.    Patient Care Team    Relationship Specialty Notifications Start End  Esaw Grandchild, NP PCP - General Family Medicine  10/17/16     Patient Active Problem List   Diagnosis Date Noted  . Contact dermatitis 10/17/2016  . Benign essential hypertension 11/04/2015  . Chronic low back pain without sciatica 11/04/2015  . Hyperglycemia 11/04/2015  . Hypertriglyceridemia 11/04/2015  . Seasonal allergic rhinitis due to pollen 11/04/2015     Past Medical History:  Diagnosis Date  . Diabetes mellitus without complication ([place])   . Hypertension      Past Surgical History:  Procedure Laterality Date  . APPENDECTOMY       Family History  Problem Relation Age of Onset  . Hypertension Mother   . Diabetes Daughter   . Learning disabilities Maternal Grandmother   . Learning disabilities Maternal Grandfather      History  Drug Use No     History  Alcohol Use No     History  Smoking Status  . Former Smoker  . Quit date: 11/07/2016  Smokeless Tobacco  . Never Used     Outpatient Encounter Prescriptions as of 02/12/2017  Medication Sig  . allopurinol (ZYLOPRIM) 300 MG tablet Take 1 tablet (300 mg total) by mouth daily.  Marland Kitchen amLODipine (NORVASC) 5 MG tablet Take 1 tablet (5 mg total) by mouth daily. Pt needs office visit prior to any further refills  . atorvastatin (LIPITOR) 10 MG tablet Take 2 tablets (20 mg total) by mouth daily.  . Blood Glucose Monitoring Suppl (CONTOUR NEXT ONE) KIT 1 each by Does not  apply route 2 (two) times daily. Check fasting blood sugar in morning and then recheck blood sugar after largest meal.  . cyclobenzaprine (FLEXERIL) 10 MG tablet Take 1 tablet by mouth 3 (three) times daily as needed.  . ezetimibe (ZETIA) 10 MG tablet Take 1 tablet (10 mg total) by mouth daily.  Marland Kitchen glucose blood test strip Use to check blood sugars three times daily  . indomethacin (INDOCIN) 50 MG capsule Take 50 mg by mouth 3 (three) times daily.  Marland Kitchen lisinopril (PRINIVIL,ZESTRIL) 40 MG tablet Take 1 tablet (40 mg total) by mouth daily.  . metFORMIN (GLUCOPHAGE) 500 MG tablet Take 1 tablet (500 mg total) by mouth 3 (three) times daily with meals. For the first two weeks please just take one tablet by mouth after dinner.  Then take one tablet with breakfast and one with dinner.  Marland Kitchen omega-3 acid ethyl esters (LOVAZA) 1 g capsule Take 2 capsules (2 g total) by mouth 2 (two) times daily.  . [DISCONTINUED] amLODipine (NORVASC) 5 MG tablet Take 1 tablet (5 mg total) by mouth daily. Pt needs office visit prior to any further refills  . [DISCONTINUED] glucose blood test strip Use as instructed  . [DISCONTINUED] lisinopril (PRINIVIL,ZESTRIL) 40 MG tablet Take 1 tablet (40 mg total) by mouth daily.  . [DISCONTINUED] metFORMIN (GLUCOPHAGE) 500 MG tablet Take 1 tablet (500 mg  total) by mouth 2 (two) times daily with a meal. For the first two weeks please just take one tablet by mouth after dinner.  Then take one tablet with breakfast and one with dinner.   No facility-administered encounter medications on file as of 02/12/2017.     Allergies: Cetirizine  Body mass index is 45.28 kg/m.  Blood pressure 108/75, pulse (!) 101, height 6' 0.25" (1.835 m), weight (!) 336 lb 3.2 oz (152.5 kg).     Review of Systems  Constitutional: Positive for fatigue. Negative for activity change, appetite change, chills, diaphoresis, fever and unexpected weight change.  Eyes: Negative for visual disturbance.  Respiratory:  Negative for cough, chest tightness, shortness of breath and wheezing.   Cardiovascular: Negative for chest pain, palpitations and leg swelling.  Gastrointestinal: Negative for abdominal distention, abdominal pain, blood in stool, constipation, diarrhea, nausea and vomiting.  Endocrine: Negative for cold intolerance, heat intolerance, polydipsia, polyphagia and polyuria.  Genitourinary: Negative for difficulty urinating and flank pain.  Skin: Negative for color change, pallor, rash and wound.  Allergic/Immunologic: Negative for immunocompromised state.  Neurological: Negative for dizziness and headaches.  Hematological: Does not bruise/bleed easily.  Psychiatric/Behavioral: Negative for sleep disturbance.       Objective:   Physical Exam  Constitutional: He is oriented to person, place, and time. He appears well-developed and well-nourished. No distress.  HENT:  Head: Normocephalic and atraumatic.  Right Ear: External ear normal.  Left Ear: External ear normal.  Eyes: Pupils are equal, round, and reactive to light. Conjunctivae are normal.  Cardiovascular: Normal rate, regular rhythm, normal heart sounds and intact distal pulses.   No murmur heard. Pulmonary/Chest: Effort normal and breath sounds normal. No respiratory distress. He has no wheezes. He has no rales. He exhibits no tenderness.  Neurological: He is alert and oriented to person, place, and time. Coordination normal.  Skin: Skin is warm and dry. No rash noted. He is not diaphoretic. No erythema. No pallor.  Psychiatric: He has a normal mood and affect. His behavior is normal. Judgment and thought content normal.  Nursing note and vitals reviewed.         Assessment & Plan:   1. Benign essential hypertension   2. Hyperglycemia   3. Hypertriglyceridemia     Benign essential hypertension BP at goal 108/75 Continue Lisinopril 68m and Amlodipine 554mdaily. He has completely stopped tobacco/EOTH  use-GREAT!  Hypertriglyceridemia Continue heart healthy diet and regular exercise-4079m elliptical 5 days a week. Continue Atorvastatin and Zetia as directed. Will check fasting labs, CMP, A1c next week and will adjust meds as indicated.  Hyperglycemia Am BS running 160-170s despite low CHO intake and regular exercise. Increased Metformin 500m60mom BID to TID Will check A1c next week and adjust medications as indicated.    FOLLOW-UP:  Return in about 4 months (around 06/14/2017) for Regular Follow Up, HTN, Hypercholestermia, Obesity, Diabetes.

## 2017-02-12 NOTE — Assessment & Plan Note (Signed)
>>  ASSESSMENT AND PLAN FOR BENIGN ESSENTIAL HYPERTENSION WRITTEN ON 02/12/2017  4:32 PM BY DANFORD, KATY D, NP  BP at goal 108/75 Continue Lisinopril 40mg  and Amlodipine 5mg  daily. He has completely stopped tobacco/EOTH use-GREAT!

## 2017-02-19 ENCOUNTER — Other Ambulatory Visit: Payer: BLUE CROSS/BLUE SHIELD

## 2017-02-19 DIAGNOSIS — I10 Essential (primary) hypertension: Secondary | ICD-10-CM | POA: Diagnosis not present

## 2017-02-19 DIAGNOSIS — Z79899 Other long term (current) drug therapy: Secondary | ICD-10-CM | POA: Diagnosis not present

## 2017-02-19 DIAGNOSIS — E781 Pure hyperglyceridemia: Secondary | ICD-10-CM

## 2017-02-19 DIAGNOSIS — R739 Hyperglycemia, unspecified: Secondary | ICD-10-CM

## 2017-02-20 LAB — COMPREHENSIVE METABOLIC PANEL
ALBUMIN: 4.3 g/dL (ref 3.5–5.5)
ALT: 31 IU/L (ref 0–44)
AST: 19 IU/L (ref 0–40)
Albumin/Globulin Ratio: 2.7 — ABNORMAL HIGH (ref 1.2–2.2)
Alkaline Phosphatase: 61 IU/L (ref 39–117)
BILIRUBIN TOTAL: 0.6 mg/dL (ref 0.0–1.2)
BUN / CREAT RATIO: 16 (ref 9–20)
BUN: 15 mg/dL (ref 6–24)
CALCIUM: 9.2 mg/dL (ref 8.7–10.2)
CHLORIDE: 104 mmol/L (ref 96–106)
CO2: 23 mmol/L (ref 20–29)
Creatinine, Ser: 0.93 mg/dL (ref 0.76–1.27)
GFR calc non Af Amer: 99 mL/min/{1.73_m2} (ref >59)
GFR, EST AFRICAN AMERICAN: 114 mL/min/{1.73_m2} (ref >59)
GLUCOSE: 209 mg/dL — AB (ref 65–99)
Globulin, Total: 1.6 g/dL (ref 1.5–4.5)
Potassium: 4.9 mmol/L (ref 3.5–5.2)
Sodium: 140 mmol/L (ref 134–144)
TOTAL PROTEIN: 5.9 g/dL — AB (ref 6.0–8.5)

## 2017-02-20 LAB — HEMOGLOBIN A1C
Est. average glucose Bld gHb Est-mCnc: 197 mg/dL
Hgb A1c MFr Bld: 8.5 % — ABNORMAL HIGH (ref 4.8–5.6)

## 2017-02-20 LAB — LIPID PANEL
CHOL/HDL RATIO: 2.4 ratio (ref 0.0–5.0)
Cholesterol, Total: 77 mg/dL — ABNORMAL LOW (ref 100–199)
HDL: 32 mg/dL — AB (ref >39)
LDL CALC: 24 mg/dL (ref 0–99)
TRIGLYCERIDES: 104 mg/dL (ref 0–149)
VLDL CHOLESTEROL CAL: 21 mg/dL (ref 5–40)

## 2017-02-21 LAB — HEPATIC FUNCTION PANEL
ALBUMIN: 4.3 g/dL (ref 3.5–5.5)
ALT: 31 IU/L (ref 0–44)
AST: 19 IU/L (ref 0–40)
Alkaline Phosphatase: 62 IU/L (ref 39–117)
BILIRUBIN TOTAL: 0.5 mg/dL (ref 0.0–1.2)
BILIRUBIN, DIRECT: 0.18 mg/dL (ref 0.00–0.40)
Total Protein: 6 g/dL (ref 6.0–8.5)

## 2017-02-21 LAB — SPECIMEN STATUS REPORT

## 2017-05-14 ENCOUNTER — Other Ambulatory Visit: Payer: Self-pay | Admitting: Adult Health

## 2017-05-21 ENCOUNTER — Other Ambulatory Visit: Payer: Self-pay | Admitting: Adult Health

## 2017-06-19 NOTE — Progress Notes (Signed)
Subjective:    Patient ID: Eddie Hancock, male    DOB: 11/27/1970, 46 y.o.   MRN: [id number]  HPI: 02/12/17 OV:  Eddie Hancock is here for f/u: HTN.  He has been compliant on all medications and denies SE.  He has been exercising 5 days a week-40 mins on elliptical machine in basement.  He has dramatically reduced saturated fat/cho/sguar from diet and completely eliminated tobacco/EOTH.  He reports am bs 160-170s, despite low cho/cal meals in evening. He denies episodes of hypoglycemia.    12/13/4 OV: Eddie Hancock is here for f/u: HTN, HL, T2D, Obesity, and flare up of lumbar back pain that developed spontaneously 2 weeks ago. Pain is constant, dull ache and rated 6/10.  He denies acute injury/accident prior to onset of pain. He reports medication compliance and denies SE.  He reports am BS 140-170s, and denies episodes of hypoglycemia. He has slacked off the biking but still riding 40 mins 1-2 times/week. He has reduced frequency of alcohol- now only once/month- however he consumes a large amount when he does- 1/5 liquor and 12 beers.  He denies ETOH use causing problems with personal relationships/work responsibilities . Patient Care Team    Relationship Specialty Notifications Start End  Esaw Grandchild, NP PCP - General Family Medicine  10/17/16     Patient Active Problem List   Diagnosis Date Noted  . Diabetes mellitus without complication ([place]) 81/82/9937  . Hyperlipidemia associated with type 2 diabetes mellitus ([place]) 06/21/2017  . Healthcare maintenance 06/21/2017  . Contact dermatitis 10/17/2016  . Benign essential hypertension 11/04/2015  . Chronic low back pain without sciatica 11/04/2015  . Hyperglycemia 11/04/2015  . Hypertriglyceridemia 11/04/2015  . Seasonal allergic rhinitis due to pollen 11/04/2015     Past Medical History:  Diagnosis Date  . Diabetes mellitus without complication ([place])   . Hypertension      Past Surgical History:  Procedure Laterality Date  .  APPENDECTOMY       Family History  Problem Relation Age of Onset  . Hypertension Mother   . Diabetes Daughter   . Learning disabilities Maternal Grandmother   . Learning disabilities Maternal Grandfather      Social History   Substance and Sexual Activity  Drug Use No     Social History   Substance and Sexual Activity  Alcohol Use No     Social History   Tobacco Use  Smoking Status Former Smoker  . Last attempt to quit: 11/07/2016  . Years since quitting: 0.6  Smokeless Tobacco Never Used     Outpatient Encounter Medications as of 06/21/2017  Medication Sig  . allopurinol (ZYLOPRIM) 300 MG tablet Take 1 tablet (300 mg total) by mouth daily.  Marland Kitchen amLODipine (NORVASC) 5 MG tablet TAKE 1 TABLET(5 MG) BY MOUTH DAILY  . atorvastatin (LIPITOR) 10 MG tablet TAKE 2 TABLETS(20 MG) BY MOUTH DAILY  . Blood Glucose Monitoring Suppl (CONTOUR NEXT ONE) KIT 1 each by Does not apply route 2 (two) times daily. Check fasting blood sugar in morning and then recheck blood sugar after largest meal.  . cyclobenzaprine (FLEXERIL) 10 MG tablet Take 1 tablet by mouth 3 (three) times daily as needed.  . ezetimibe (ZETIA) 10 MG tablet Take 1 tablet (10 mg total) by mouth daily.  Marland Kitchen glucose blood test strip Use to check blood sugars three times daily  . lisinopril (PRINIVIL,ZESTRIL) 40 MG tablet Take 1 tablet (40 mg total) by mouth daily.  . metFORMIN (GLUCOPHAGE) 500  MG tablet Take 1 tablet (500 mg total) by mouth 3 (three) times daily with meals. For the first two weeks please just take one tablet by mouth after dinner.  Then take one tablet with breakfast and one with dinner.  Marland Kitchen omega-3 acid ethyl esters (LOVAZA) 1 g capsule TAKE 2 CAPSULES BY MOUTH TWICE DAILY  . [DISCONTINUED] indomethacin (INDOCIN) 50 MG capsule Take 50 mg by mouth 3 (three) times daily.  . meloxicam (MOBIC) 7.5 MG tablet Take 1 tablet (7.5 mg total) by mouth 2 (two) times daily with a meal.   No facility-administered  encounter medications on file as of 06/21/2017.     Allergies: Cetirizine  Body mass index is 45.74 kg/m.  Blood pressure 127/81, pulse 89, height 6' 0.25" (1.835 m), weight (!) 339 lb 9.6 oz (154 kg), SpO2 97 %.     Review of Systems  Constitutional: Positive for fatigue. Negative for activity change, appetite change, chills, diaphoresis, fever and unexpected weight change.  Eyes: Negative for visual disturbance.  Respiratory: Negative for cough, chest tightness, shortness of breath and wheezing.   Cardiovascular: Negative for chest pain, palpitations and leg swelling.  Gastrointestinal: Negative for abdominal distention, abdominal pain, blood in stool, constipation, diarrhea, nausea and vomiting.  Endocrine: Negative for cold intolerance, heat intolerance, polydipsia, polyphagia and polyuria.  Genitourinary: Negative for difficulty urinating and flank pain.  Musculoskeletal: Positive for back pain.  Skin: Negative for color change, pallor, rash and wound.  Allergic/Immunologic: Negative for immunocompromised state.  Neurological: Negative for dizziness and headaches.  Hematological: Does not bruise/bleed easily.  Psychiatric/Behavioral: Negative for sleep disturbance.       Objective:   Physical Exam  Constitutional: He is oriented to person, place, and time. He appears well-developed and well-nourished. No distress.  HENT:  Head: Normocephalic and atraumatic.  Right Ear: External ear normal.  Left Ear: External ear normal.  Eyes: Conjunctivae are normal. Pupils are equal, round, and reactive to light.  Cardiovascular: Normal rate, regular rhythm, normal heart sounds and intact distal pulses.  No murmur heard. Pulmonary/Chest: Effort normal and breath sounds normal. No respiratory distress. He has no wheezes. He has no rales. He exhibits no tenderness.  Musculoskeletal: Normal range of motion. He exhibits tenderness.       Lumbar back: He exhibits tenderness and pain. He  exhibits normal range of motion.  Neurological: He is alert and oriented to person, place, and time. Coordination normal.  Skin: Skin is warm and dry. No rash noted. He is not diaphoretic. No erythema. No pallor.  Psychiatric: He has a normal mood and affect. His behavior is normal. Judgment and thought content normal.  Nursing note and vitals reviewed.         Assessment & Plan:   1. Hyperglycemia   2. Need for pneumococcal vaccination   3. Diabetes mellitus without complication ([place])   4. Benign essential hypertension   5. Hypertriglyceridemia   6. Hyperlipidemia associated with type 2 diabetes mellitus ([place])   7. Chronic bilateral low back pain without sciatica   8. Healthcare maintenance     Benign essential hypertension BP at goal 127/81, HR 89 Continue Lisinopril 4m and Atorvastatin 55mdaily  Hypertriglyceridemia Continue ezetimibe 108maily and reduce ETOH use  Hyperlipidemia associated with type 2 diabetes mellitus ([place] your excellent water intake.   Follow Heart Healthy diet Increase regular exercise.  Recommend at least 30 minutes daily, 5 days per week of walking, jogging, biking, swimming, YouTube/Pinterest workout videos. Reduce overall  alcohol consumption. Continue atorvastatin 11m daily  Chronic low back pain without sciatica Recommend chiropractic care. Please use Meloxicam as directed. Weight loss  Healthcare maintenance Please return in 4 months for A1c re-check and complete physical.    FOLLOW-UP:  Return in about 4 months (around 10/20/2017) for Regular Follow Up, CPE.

## 2017-06-21 ENCOUNTER — Ambulatory Visit (INDEPENDENT_AMBULATORY_CARE_PROVIDER_SITE_OTHER): Payer: BLUE CROSS/BLUE SHIELD | Admitting: Adult Health

## 2017-06-21 ENCOUNTER — Encounter: Payer: Self-pay | Admitting: Adult Health

## 2017-06-21 VITALS — BP 127/81 | HR 89 | Ht 72.25 in | Wt 339.6 lb

## 2017-06-21 DIAGNOSIS — Z23 Encounter for immunization: Secondary | ICD-10-CM | POA: Diagnosis not present

## 2017-06-21 DIAGNOSIS — E785 Hyperlipidemia, unspecified: Secondary | ICD-10-CM

## 2017-06-21 DIAGNOSIS — G8929 Other chronic pain: Secondary | ICD-10-CM

## 2017-06-21 DIAGNOSIS — E119 Type 2 diabetes mellitus without complications: Secondary | ICD-10-CM | POA: Diagnosis not present

## 2017-06-21 DIAGNOSIS — I10 Essential (primary) hypertension: Secondary | ICD-10-CM

## 2017-06-21 DIAGNOSIS — E1169 Type 2 diabetes mellitus with other specified complication: Secondary | ICD-10-CM

## 2017-06-21 DIAGNOSIS — R739 Hyperglycemia, unspecified: Secondary | ICD-10-CM

## 2017-06-21 DIAGNOSIS — E781 Pure hyperglyceridemia: Secondary | ICD-10-CM

## 2017-06-21 DIAGNOSIS — M545 Low back pain: Secondary | ICD-10-CM

## 2017-06-21 DIAGNOSIS — E782 Mixed hyperlipidemia: Secondary | ICD-10-CM | POA: Insufficient documentation

## 2017-06-21 DIAGNOSIS — Z0000 Encounter for general adult medical examination without abnormal findings: Secondary | ICD-10-CM

## 2017-06-21 LAB — POCT GLYCOSYLATED HEMOGLOBIN (HGB A1C): HEMOGLOBIN A1C: 7.2

## 2017-06-21 MED ORDER — MELOXICAM 7.5 MG PO TABS: 7.5000 mg | ORAL_TABLET | Freq: Two times a day (BID) | ORAL | 1 refills | Status: DC

## 2017-06-21 NOTE — Assessment & Plan Note (Signed)
A1c 7.2, good reduction from 8.5 8/18 Continue metformin 500mg  TID

## 2017-06-21 NOTE — Assessment & Plan Note (Signed)
>>  ASSESSMENT AND PLAN FOR HYPERLIPIDEMIA ASSOCIATED WITH TYPE 2 DIABETES MELLITUS ([facility]) WRITTEN ON 06/21/2017  4:51 PM BY DANFORD, Orpha Bur D, NP  Continue your excellent water intake.   Follow Heart Healthy diet Increase regular exercise.  Recommend at least 30 minutes daily, 5 days per week of walking, jogging, biking, swimming, YouTube/Pinterest workout videos. Reduce overall alcohol consumption. Continue atorvastatin 10mg  daily

## 2017-06-21 NOTE — Assessment & Plan Note (Signed)
Continue your excellent water intake.   Follow Heart Healthy diet Increase regular exercise.  Recommend at least 30 minutes daily, 5 days per week of walking, jogging, biking, swimming, YouTube/Pinterest workout videos. Reduce overall alcohol consumption. Continue atorvastatin 10mg  daily

## 2017-06-21 NOTE — Assessment & Plan Note (Signed)
>>  ASSESSMENT AND PLAN FOR BENIGN ESSENTIAL HYPERTENSION WRITTEN ON 06/21/2017  4:49 PM BY DANFORD, KATY D, NP  BP at goal 127/81, HR 89 Continue Lisinopril 40mg  and Atorvastatin 5mg  daily

## 2017-06-21 NOTE — Patient Instructions (Signed)
Diabetes Mellitus and Food It is important for you to manage your blood sugar (glucose) level. Your blood glucose level can be greatly affected by what you eat. Eating healthier foods in the appropriate amounts throughout the day at about the same time each day will help you control your blood glucose level. It can also help slow or prevent worsening of your diabetes mellitus. Healthy eating may even help you improve the level of your blood pressure and reach or maintain a healthy weight. General recommendations for healthful eating and cooking habits include:  Eating meals and snacks regularly. Avoid going long periods of time without eating to lose weight.  Eating a diet that consists mainly of plant-based foods, such as fruits, vegetables, nuts, legumes, and whole grains.  Using low-heat cooking methods, such as baking, instead of high-heat cooking methods, such as deep frying.  Work with your dietitian to make sure you understand how to use the Nutrition Facts information on food labels. How can food affect me? Carbohydrates Carbohydrates affect your blood glucose level more than any other type of food. Your dietitian will help you determine how many carbohydrates to eat at each meal and teach you how to count carbohydrates. Counting carbohydrates is important to keep your blood glucose at a healthy level, especially if you are using insulin or taking certain medicines for diabetes mellitus. Alcohol Alcohol can cause sudden decreases in blood glucose (hypoglycemia), especially if you use insulin or take certain medicines for diabetes mellitus. Hypoglycemia can be a life-threatening condition. Symptoms of hypoglycemia (sleepiness, dizziness, and disorientation) are similar to symptoms of having too much alcohol. If your health care provider has given you approval to drink alcohol, do so in moderation and use the following guidelines:  Women should not have more than one drink per day, and men  should not have more than two drinks per day. One drink is equal to: ? 12 oz of beer. ? 5 oz of wine. ? 1 oz of hard liquor.  Do not drink on an empty stomach.  Keep yourself hydrated. Have water, diet soda, or unsweetened iced tea.  Regular soda, juice, and other mixers might contain a lot of carbohydrates and should be counted.  What foods are not recommended? As you make food choices, it is important to remember that all foods are not the same. Some foods have fewer nutrients per serving than other foods, even though they might have the same number of calories or carbohydrates. It is difficult to get your body what it needs when you eat foods with fewer nutrients. Examples of foods that you should avoid that are high in calories and carbohydrates but low in nutrients include:  Trans fats (most processed foods list trans fats on the Nutrition Facts label).  Regular soda.  Juice.  Candy.  Sweets, such as cake, pie, doughnuts, and cookies.  Fried foods.  What foods can I eat? Eat nutrient-rich foods, which will nourish your body and keep you healthy. The food you should eat also will depend on several factors, including:  The calories you need.  The medicines you take.  Your weight.  Your blood glucose level.  Your blood pressure level.  Your cholesterol level.  You should eat a variety of foods, including:  Protein. ? Lean cuts of meat. ? Proteins low in saturated fats, such as fish, egg whites, and beans. Avoid processed meats.  Fruits and vegetables. ? Fruits and vegetables that may help control blood glucose levels, such as apples,   mangoes, and yams.  Dairy products. ? Choose fat-free or low-fat dairy products, such as milk, yogurt, and cheese.  Grains, bread, pasta, and rice. ? Choose whole grain products, such as multigrain bread, whole oats, and brown rice. These foods may help control blood pressure.  Fats. ? Foods containing healthful fats, such as  nuts, avocado, olive oil, canola oil, and fish.  Does everyone with diabetes mellitus have the same meal plan? Because every person with diabetes mellitus is different, there is not one meal plan that works for everyone. It is very important that you meet with a dietitian who will help you create a meal plan that is just right for you. This information is not intended to replace advice given to you by your health care provider. Make sure you discuss any questions you have with your health care provider. Document Released: 03/23/2005 Document Revised: 12/02/2015 Document Reviewed: 05/23/2013 Elsevier Interactive Patient Education  2017 [place].  Back Exercises The following exercises strengthen the muscles that help to support the back. They also help to keep the lower back flexible. Doing these exercises can help to prevent back pain or lessen existing pain. If you have back pain or discomfort, try doing these exercises 2-3 times each day or as told by your health care provider. When the pain goes away, do them once each day, but increase the number of times that you repeat the steps for each exercise (do more repetitions). If you do not have back pain or discomfort, do these exercises once each day or as told by your health care provider. Exercises Single Knee to Chest  Repeat these steps 3-5 times for each leg: 1. Lie on your back on a firm bed or the floor with your legs extended. 2. Bring one knee to your chest. Your other leg should stay extended and in contact with the floor. 3. Hold your knee in place by grabbing your knee or thigh. 4. Pull on your knee until you feel a gentle stretch in your lower back. 5. Hold the stretch for 10-30 seconds. 6. Slowly release and straighten your leg.  Pelvic Tilt  Repeat these steps 5-10 times: 1. Lie on your back on a firm bed or the floor with your legs extended. 2. Bend your knees so they are pointing toward the ceiling and your feet are  flat on the floor. 3. Tighten your lower abdominal muscles to press your lower back against the floor. This motion will tilt your pelvis so your tailbone points up toward the ceiling instead of pointing to your feet or the floor. 4. With gentle tension and even breathing, hold this position for 5-10 seconds.  Cat-Cow  Repeat these steps until your lower back becomes more flexible: 1. Get into a hands-and-knees position on a firm surface. Keep your hands under your shoulders, and keep your knees under your hips. You may place padding under your knees for comfort. 2. Let your head hang down, and point your tailbone toward the floor so your lower back becomes rounded like the back of a cat. 3. Hold this position for 5 seconds. 4. Slowly lift your head and point your tailbone up toward the ceiling so your back forms a sagging arch like the back of a cow. 5. Hold this position for 5 seconds.  Press-Ups  Repeat these steps 5-10 times: 1. Lie on your abdomen (face-down) on the floor. 2. Place your palms near your head, about shoulder-width apart. 3. While you keep your  back as relaxed as possible and keep your hips on the floor, slowly straighten your arms to raise the top half of your body and lift your shoulders. Do not use your back muscles to raise your upper torso. You may adjust the placement of your hands to make yourself more comfortable. 4. Hold this position for 5 seconds while you keep your back relaxed. 5. Slowly return to lying flat on the floor.  Bridges  Repeat these steps 10 times: 1. Lie on your back on a firm surface. 2. Bend your knees so they are pointing toward the ceiling and your feet are flat on the floor. 3. Tighten your buttocks muscles and lift your buttocks off of the floor until your waist is at almost the same height as your knees. You should feel the muscles working in your buttocks and the back of your thighs. If you do not feel these muscles, slide your feet 1-2  inches farther away from your buttocks. 4. Hold this position for 3-5 seconds. 5. Slowly lower your hips to the starting position, and allow your buttocks muscles to relax completely.  If this exercise is too easy, try doing it with your arms crossed over your chest. Abdominal Crunches  Repeat these steps 5-10 times: 1. Lie on your back on a firm bed or the floor with your legs extended. 2. Bend your knees so they are pointing toward the ceiling and your feet are flat on the floor. 3. Cross your arms over your chest. 4. Tip your chin slightly toward your chest without bending your neck. 5. Tighten your abdominal muscles and slowly raise your trunk (torso) high enough to lift your shoulder blades a tiny bit off of the floor. Avoid raising your torso higher than that, because it can put too much stress on your low back and it does not help to strengthen your abdominal muscles. 6. Slowly return to your starting position.  Back Lifts Repeat these steps 5-10 times: 1. Lie on your abdomen (face-down) with your arms at your sides, and rest your forehead on the floor. 2. Tighten the muscles in your legs and your buttocks. 3. Slowly lift your chest off of the floor while you keep your hips pressed to the floor. Keep the back of your head in line with the curve in your back. Your eyes should be looking at the floor. 4. Hold this position for 3-5 seconds. 5. Slowly return to your starting position.  Contact a health care provider if:  Your back pain or discomfort gets much worse when you do an exercise.  Your back pain or discomfort does not lessen within 2 hours after you exercise. If you have any of these problems, stop doing these exercises right away. Do not do them again unless your health care provider says that you can. Get help right away if:  You develop sudden, severe back pain. If this happens, stop doing the exercises right away. Do not do them again unless your health care provider  says that you can. This information is not intended to replace advice given to you by your health care provider. Make sure you discuss any questions you have with your health care provider. Document Released: 08/03/2004 Document Revised: 11/03/2015 Document Reviewed: 08/20/2014 Elsevier Interactive Patient Education  2017 [place] your excellent water intake.   Follow Heart Healthy diet Increase regular exercise.  Recommend at least 30 minutes daily, 5 days per week of walking, jogging, biking, swimming, YouTube/Pinterest  workout videos. Reduce overall alcohol consumption. Recommend chiropractic care. Please use Meloxicam as directed. Please return in 4 months for A1c re-check and complete physical.

## 2017-06-21 NOTE — Assessment & Plan Note (Signed)
Continue ezetimibe 10mg  daily and reduce ETOH use

## 2017-06-21 NOTE — Assessment & Plan Note (Signed)
BP at goal 127/81, HR 89 Continue Lisinopril 40mg  and Atorvastatin 5mg  daily

## 2017-06-21 NOTE — Assessment & Plan Note (Signed)
Please return in 4 months for A1c re-check and complete physical.

## 2017-06-21 NOTE — Assessment & Plan Note (Addendum)
Recommend chiropractic care. Please use Meloxicam as directed. Weight loss

## 2017-08-09 ENCOUNTER — Other Ambulatory Visit: Payer: Self-pay | Admitting: Adult Health

## 2017-08-20 ENCOUNTER — Other Ambulatory Visit: Payer: Self-pay | Admitting: Adult Health

## 2017-08-20 ENCOUNTER — Other Ambulatory Visit: Payer: Self-pay

## 2017-08-20 DIAGNOSIS — I10 Essential (primary) hypertension: Secondary | ICD-10-CM

## 2017-08-20 MED ORDER — AMLODIPINE BESYLATE 5 MG PO TABS: ORAL_TABLET | ORAL | 1 refills | Status: DC

## 2017-08-20 NOTE — Telephone Encounter (Signed)
Pharmacy sent refill request for Amlodipine.  Reviewed chart and sent in refills. MPulliam, CMA/RT(R)

## 2017-08-27 ENCOUNTER — Other Ambulatory Visit: Payer: Self-pay | Admitting: Adult Health

## 2017-09-03 DIAGNOSIS — E119 Type 2 diabetes mellitus without complications: Secondary | ICD-10-CM | POA: Diagnosis not present

## 2017-09-03 DIAGNOSIS — H524 Presbyopia: Secondary | ICD-10-CM | POA: Diagnosis not present

## 2017-09-03 LAB — HM DIABETES EYE EXAM

## 2017-10-22 ENCOUNTER — Ambulatory Visit: Payer: BLUE CROSS/BLUE SHIELD | Admitting: Adult Health

## 2017-10-30 ENCOUNTER — Other Ambulatory Visit: Payer: Self-pay | Admitting: Adult Health

## 2017-10-30 NOTE — Telephone Encounter (Signed)
Pt has appt 11/29/17.  However, pt has not had RX for allopurinol RX from [place] since 11/2016.  Please review and authorize medications if appropriate.  [name], CMA

## 2017-11-01 ENCOUNTER — Telehealth: Payer: Self-pay | Admitting: Adult Health

## 2017-11-01 MED ORDER — METFORMIN HCL 500 MG PO TABS: 500.0000 mg | ORAL_TABLET | Freq: Three times a day (TID) | ORAL | 0 refills | Status: DC

## 2017-11-01 NOTE — Telephone Encounter (Signed)
Verified in last office note that pt is to be taking Metformin 500mg  TID.  Rx resent to pharmacy and pt's spouse informed.  [name], CMA

## 2017-11-01 NOTE — Telephone Encounter (Signed)
Patient's wife called stating he is out of his metformin that the dosage was changed from 2 tabs daily to three per [facility]. He has since ran out sooner than prescription ordered because of this and the pharmacy will not refill med. They are requesting a new prescription to be sent to [facility] on [address]. Please advise.

## 2017-11-01 NOTE — Addendum Note (Signed)
Addended by: Fonnie Mu on: 11/01/2017 01:30 PM   Modules accepted: Orders

## 2017-11-07 ENCOUNTER — Other Ambulatory Visit: Payer: Self-pay | Admitting: Adult Health

## 2017-11-15 ENCOUNTER — Other Ambulatory Visit: Payer: Self-pay | Admitting: Adult Health

## 2017-11-20 ENCOUNTER — Other Ambulatory Visit: Payer: Self-pay | Admitting: Adult Health

## 2017-11-28 NOTE — Progress Notes (Signed)
Subjective:    Patient ID: Eddie Hancock, male    DOB: April 02, 1971, 47 y.o.   MRN: [id number]  HPI: 02/12/17 OV:  Mr. Simonetti is here for f/u: HTN.  He has been compliant on all medications and denies SE.  He has been exercising 5 days a week-40 mins on elliptical machine in basement.  He has dramatically reduced saturated fat/cho/sguar from diet and completely eliminated tobacco/EOTH.  He reports am bs 160-170s, despite low cho/cal meals in evening. He denies episodes of hypoglycemia.    12/13/4 OV: Mr. Lewan is here for f/u: HTN, HL, T2D, Obesity, and flare up of lumbar back pain that developed spontaneously 2 weeks ago. Pain is constant, dull ache and rated 6/10.  He denies acute injury/accident prior to onset of pain. He reports medication compliance and denies SE.  He reports am BS 140-170s, and denies episodes of hypoglycemia. He has slacked off the biking but still riding 40 mins 1-2 times/week. He has reduced frequency of alcohol- now only once/month- however he consumes a large amount when he does- 1/5 liquor and 12 beers.  He denies ETOH use causing problems with personal relationships/work responsibilities .  11/28/17 OV: Mr. Maack is here for regular f/u:  HTN, HLD, T2D, and obesity He reports medication compliance, denies SE He reports AM 180-200s, postprandials 190-215 He denies episodes of hypoglycemia. He drinks >galoon water/day and tries to limit saturated fat/sugar/CHO He denies tobacco use and continues to limit ETOH use.  He know only consumes ETOH once every 6-8 weeks, however it is still a large amount when he does drink (>12 beers) He reports worsening insomnia, with "inability to turn off thoughts".   Lab Results  Component Value Date   HGBA1C 8.0 (A) 11/29/2017   HGBA1C 7.2 06/21/2017   HGBA1C 8.5 (H) 02/19/2017   Patient Care Team    Relationship Specialty Notifications Start End  Esaw Grandchild, NP PCP - General Family Medicine  10/17/16     Patient Active  Problem List   Diagnosis Date Noted  . Insomnia 11/29/2017  . Diabetes mellitus without complication ([place]) 82/42/3536  . Hyperlipidemia associated with type 2 diabetes mellitus ([place]) 06/21/2017  . Healthcare maintenance 06/21/2017  . Contact dermatitis 10/17/2016  . Benign essential hypertension 11/04/2015  . Chronic low back pain without sciatica 11/04/2015  . Hyperglycemia 11/04/2015  . Hypertriglyceridemia 11/04/2015  . Seasonal allergic rhinitis due to pollen 11/04/2015     Past Medical History:  Diagnosis Date  . Diabetes mellitus without complication ([place])   . Hypertension      Past Surgical History:  Procedure Laterality Date  . APPENDECTOMY       Family History  Problem Relation Age of Onset  . Hypertension Mother   . Diabetes Daughter   . Learning disabilities Maternal Grandmother   . Learning disabilities Maternal Grandfather      Social History   Substance and Sexual Activity  Drug Use No     Social History   Substance and Sexual Activity  Alcohol Use No     Social History   Tobacco Use  Smoking Status Former Smoker  . Last attempt to quit: 11/07/2016  . Years since quitting: 1.0  Smokeless Tobacco Never Used     Outpatient Encounter Medications as of 11/29/2017  Medication Sig  . allopurinol (ZYLOPRIM) 300 MG tablet TAKE 1 TABLET(300 MG) BY MOUTH DAILY  . amLODipine (NORVASC) 5 MG tablet TAKE 1 TABLET(5 MG) BY MOUTH DAILY  . atorvastatin (  LIPITOR) 10 MG tablet Take 2 tablets (20 mg total) by mouth daily. PATIENT MUST HAVE OFFICE VISIT PRIOR TO ANY FURTHER REFILLS  . Blood Glucose Monitoring Suppl (CONTOUR NEXT ONE) KIT 1 each by Does not apply route 2 (two) times daily. Check fasting blood sugar in morning and then recheck blood sugar after largest meal.  . cyclobenzaprine (FLEXERIL) 10 MG tablet Take 1 tablet by mouth 3 (three) times daily as needed.  . ezetimibe (ZETIA) 10 MG tablet TAKE 1 TABLET(10 MG) BY MOUTH DAILY  . glucose blood  test strip Use to check blood sugars three times daily  . lisinopril (PRINIVIL,ZESTRIL) 40 MG tablet TAKE 1 TABLET(40 MG) BY MOUTH DAILY  . meloxicam (MOBIC) 7.5 MG tablet Take 1 tablet (7.5 mg total) by mouth 2 (two) times daily with a meal.  . metFORMIN (GLUCOPHAGE) 1000 MG tablet Take 1 tablet (1,000 mg total) by mouth 2 (two) times daily with a meal.  . omega-3 acid ethyl esters (LOVAZA) 1 g capsule TAKE 2 CAPSULES BY MOUTH TWICE DAILY  . ranitidine (ZANTAC) 300 MG tablet Take 300 mg by mouth daily as needed for heartburn.  . [DISCONTINUED] metFORMIN (GLUCOPHAGE) 500 MG tablet Take 1 tablet (500 mg total) by mouth 3 (three) times daily with meals.  Marland Kitchen LORazepam (ATIVAN) 1 MG tablet Take 1/2 to 1 tab at bedtime,as needed for insomnia.   No facility-administered encounter medications on file as of 11/29/2017.     Allergies: Cetirizine  Body mass index is 45.58 kg/m.  Blood pressure 131/81, pulse 92, height 6' 0.25" (1.835 m), weight (!) 338 lb 6.4 oz (153.5 kg), SpO2 97 %.  Review of Systems  Constitutional: Positive for fatigue. Negative for activity change, appetite change, chills, diaphoresis, fever and unexpected weight change.  Eyes: Negative for visual disturbance.  Respiratory: Negative for cough, chest tightness, shortness of breath and wheezing.   Cardiovascular: Negative for chest pain, palpitations and leg swelling.  Gastrointestinal: Negative for abdominal distention, abdominal pain, blood in stool, constipation, diarrhea, nausea and vomiting.  Endocrine: Negative for cold intolerance, heat intolerance, polydipsia, polyphagia and polyuria.  Genitourinary: Negative for difficulty urinating and flank pain.  Musculoskeletal: Positive for back pain.  Skin: Negative for color change, pallor, rash and wound.  Allergic/Immunologic: Negative for immunocompromised state.  Neurological: Negative for dizziness and headaches.  Hematological: Does not bruise/bleed easily.   Psychiatric/Behavioral: Negative for sleep disturbance.       Objective:   Physical Exam  Constitutional: He is oriented to person, place, and time. He appears well-developed and well-nourished. No distress.  HENT:  Head: Normocephalic and atraumatic.  Right Ear: External ear normal.  Left Ear: External ear normal.  Eyes: Pupils are equal, round, and reactive to light. Conjunctivae are normal.  Cardiovascular: Normal rate, regular rhythm, normal heart sounds and intact distal pulses.  No murmur heard. Pulmonary/Chest: Effort normal and breath sounds normal. No respiratory distress. He has no wheezes. He has no rales. He exhibits no tenderness.  Musculoskeletal: Normal range of motion. He exhibits tenderness.       Lumbar back: He exhibits tenderness and pain. He exhibits normal range of motion.  Neurological: He is alert and oriented to person, place, and time. Coordination normal.  Skin: Skin is warm and dry. No rash noted. He is not diaphoretic. No erythema. No pallor.  Psychiatric: He has a normal mood and affect. His behavior is normal. Judgment and thought content normal.  Nursing note and vitals reviewed.     Assessment &  Plan:   1. Healthcare maintenance   2. Diabetes mellitus without complication ([place])   3. Benign essential hypertension   4. Insomnia, unspecified type     Diabetes mellitus without complication ([facility]) Lab Results  Component Value Date   HGBA1C 8.0 (A) 11/29/2017   HGBA1C 7.2 06/21/2017   HGBA1C 8.5 (H) 02/19/2017   Increased Metformin to 1033m BID If A1c still above goal at f/u in 3 months, will start GLP-1 Microalbumin- normal Continue to check BS   Benign essential hypertension BP 131/81 HR 92 Continue Amlodipine 523mQD, Lisinopril 4027mD   Insomnia [place]- no aberrancies noted. [facility] Lorazepam 1mg11m/2 to 1 tab nightly as needed for insomnia Instructed not to use with  ETOH    FOLLOW-UP:  Return in about 3 months (around 03/01/2018) for Regular Follow Up, A1c re-check, Insomnia.

## 2017-11-29 ENCOUNTER — Encounter: Payer: Self-pay | Admitting: Adult Health

## 2017-11-29 ENCOUNTER — Ambulatory Visit (INDEPENDENT_AMBULATORY_CARE_PROVIDER_SITE_OTHER): Payer: BLUE CROSS/BLUE SHIELD | Admitting: Adult Health

## 2017-11-29 VITALS — BP 131/81 | HR 92 | Ht 72.25 in | Wt 338.4 lb

## 2017-11-29 DIAGNOSIS — I10 Essential (primary) hypertension: Secondary | ICD-10-CM

## 2017-11-29 DIAGNOSIS — G4700 Insomnia, unspecified: Secondary | ICD-10-CM | POA: Diagnosis not present

## 2017-11-29 DIAGNOSIS — Z0000 Encounter for general adult medical examination without abnormal findings: Secondary | ICD-10-CM | POA: Diagnosis not present

## 2017-11-29 DIAGNOSIS — E119 Type 2 diabetes mellitus without complications: Secondary | ICD-10-CM

## 2017-11-29 LAB — POCT GLYCOSYLATED HEMOGLOBIN (HGB A1C): HEMOGLOBIN A1C: 8 % — AB (ref 4.0–5.6)

## 2017-11-29 LAB — POCT UA - MICROALBUMIN
Albumin/Creatinine Ratio, Urine, POC: <30
CREATININE, POC: 200 mg/dL
MICROALBUMIN (UR) POC: 10 mg/L

## 2017-11-29 MED ORDER — METFORMIN HCL 1000 MG PO TABS: 1000.0000 mg | ORAL_TABLET | Freq: Two times a day (BID) | ORAL | 2 refills | Status: DC

## 2017-11-29 MED ORDER — LORAZEPAM 1 MG PO TABS: ORAL_TABLET | ORAL | 0 refills | Status: DC

## 2017-11-29 NOTE — Assessment & Plan Note (Signed)
[place] Controlled Substance Database reviewed- no aberrancies noted. Practice Sleep Hy[facility] Lorazepam 1mg - 1/2 to 1 tab nightly as needed for insomnia Instructed not to use with ETOH

## 2017-11-29 NOTE — Patient Instructions (Signed)
Mediterranean Diet A Mediterranean diet refers to food and lifestyle choices that are based on the traditions of countries located on the [place]. This way of eating has been shown to help prevent certain conditions and improve outcomes for people who have chronic diseases, like kidney disease and heart disease. What are tips for following this plan? Lifestyle  Cook and eat meals together with your family, when possible.  Drink enough fluid to keep your urine clear or pale yellow.  Be physically active every day. This includes: ? Aerobic exercise like running or swimming. ? Leisure activities like gardening, walking, or housework.  Get 7-8 hours of sleep each night.  If recommended by your health care provider, drink red wine in moderation. This means 1 glass a day for nonpregnant women and 2 glasses a day for men. A glass of wine equals 5 oz (150 mL). Reading food labels  Check the serving size of packaged foods. For foods such as rice and pasta, the serving size refers to the amount of cooked product, not dry.  Check the total fat in packaged foods. Avoid foods that have saturated fat or trans fats.  Check the ingredients list for added sugars, such as corn syrup. Shopping  At the grocery store, buy most of your food from the areas near the walls of the store. This includes: ? Fresh fruits and vegetables (produce). ? Grains, beans, nuts, and seeds. Some of these may be available in unpackaged forms or large amounts (in bulk). ? Fresh seafood. ? Poultry and eggs. ? Low-fat dairy products.  Buy whole ingredients instead of prepackaged foods.  Buy fresh fruits and vegetables in-season from local farmers markets.  Buy frozen fruits and vegetables in resealable bags.  If you do not have access to quality fresh seafood, buy precooked frozen shrimp or canned fish, such as tuna, salmon, or sardines.  Buy small amounts of raw or cooked vegetables, salads, or olives from the  deli or salad bar at your store.  Stock your pantry so you always have certain foods on hand, such as olive oil, canned tuna, canned tomatoes, rice, pasta, and beans. Cooking  Cook foods with extra-virgin olive oil instead of using butter or other vegetable oils.  Have meat as a side dish, and have vegetables or grains as your main dish. This means having meat in small portions or adding small amounts of meat to foods like pasta or stew.  Use beans or vegetables instead of meat in common dishes like chili or lasagna.  Experiment with different cooking methods. Try roasting or broiling vegetables instead of steaming or sauteing them.  Add frozen vegetables to soups, stews, pasta, or rice.  Add nuts or seeds for added healthy fat at each meal. You can add these to yogurt, salads, or vegetable dishes.  Marinate fish or vegetables using olive oil, lemon juice, garlic, and fresh herbs. Meal planning  Plan to eat 1 vegetarian meal one day each week. Try to work up to 2 vegetarian meals, if possible.  Eat seafood 2 or more times a week.  Have healthy snacks readily available, such as: ? Vegetable sticks with hummus. ? Greek yogurt. ? Fruit and nut trail mix.  Eat balanced meals throughout the week. This includes: ? Fruit: 2-3 servings a day ? Vegetables: 4-5 servings a day ? Low-fat dairy: 2 servings a day ? Fish, poultry, or lean meat: 1 serving a day ? Beans and legumes: 2 or more servings a week ? Nuts   and seeds: 1-2 servings a day ? Whole grains: 6-8 servings a day ? Extra-virgin olive oil: 3-4 servings a day  Limit red meat and sweets to only a few servings a month What are my food choices?  Mediterranean diet ? Recommended ? Grains: Whole-grain pasta. Brown rice. Bulgar wheat. Polenta. Couscous. Whole-wheat bread. [place]. ? Vegetables: Artichokes. Beets. Broccoli. Cabbage. Carrots. Eggplant. Green beans. Chard. Kale. Spinach. Onions. Leeks. Peas. Squash.  Tomatoes. Peppers. Radishes. ? Fruits: Apples. Apricots. Avocado. Berries. Bananas. Cherries. Dates. Figs. Grapes. Lemons. Melon. Oranges. Peaches. Plums. Pomegranate. ? Meats and other protein foods: Beans. Almonds. Sunflower seeds. Pine nuts. Peanuts. [place]. Salmon. Scallops. Shrimp. [place]. Tilapia. Clams. Oysters. Eggs. ? Dairy: Low-fat milk. Cheese. Greek yogurt. ? Beverages: Water. Red wine. Herbal tea. ? Fats and oils: Extra virgin olive oil. Avocado oil. Grape seed oil. ? Sweets and desserts: [place] yogurt with honey. Baked apples. Poached pears. Trail mix. ? Seasoning and other foods: Basil. Cilantro. Coriander. Cumin. Mint. Parsley. Sage. Rosemary. Tarragon. Garlic. Oregano. Thyme. Pepper. Balsalmic vinegar. Tahini. Hummus. Tomato sauce. Olives. Mushrooms. ? Limit these ? Grains: Prepackaged pasta or rice dishes. Prepackaged cereal with added sugar. ? Vegetables: Deep fried potatoes (french fries). ? Fruits: Fruit canned in syrup. ? Meats and other protein foods: Beef. Pork. Lamb. Poultry with skin. Hot dogs. [place] Salines. ? Dairy: Ice cream. Sour cream. Whole milk. ? Beverages: Juice. Sugar-sweetened soft drinks. Beer. Liquor and spirits. ? Fats and oils: Butter. Canola oil. Vegetable oil. Beef fat (tallow). Lard. ? Sweets and desserts: Cookies. Cakes. Pies. Candy. ? Seasoning and other foods: Mayonnaise. Premade sauces and marinades. ? The items listed may not be a complete list. Talk with your dietitian about what dietary choices are right for you. Summary  The Mediterranean diet includes both food and lifestyle choices.  Eat a variety of fresh fruits and vegetables, beans, nuts, seeds, and whole grains.  Limit the amount of red meat and sweets that you eat.  Talk with your health care provider about whether it is safe for you to drink red wine in moderation. This means 1 glass a day for nonpregnant women and 2 glasses a day for men. A glass of wine equals 5 oz (150 mL). This information  is not intended to replace advice given to you by your health care provider. Make sure you discuss any questions you have with your health care provider. Document Released: 02/17/2016 Document Revised: 03/21/2016 Document Reviewed: 02/17/2016 Elsevier Interactive Patient Education  2018 [employer].  Insomnia Insomnia is a sleep disorder that makes it difficult to fall asleep or to stay asleep. Insomnia can cause tiredness (fatigue), low energy, difficulty concentrating, mood swings, and poor performance at work or school. There are three different ways to classify insomnia:  Difficulty falling asleep.  Difficulty staying asleep.  Waking up too early in the morning.  Any type of insomnia can be long-term (chronic) or short-term (acute). Both are common. Short-term insomnia usually lasts for three months or less. Chronic insomnia occurs at least three times a week for longer than three months. What are the causes? Insomnia may be caused by another condition, situation, or substance, such as:  Anxiety.  Certain medicines.  Gastroesophageal reflux disease (GERD) or other gastrointestinal conditions.  Asthma or other breathing conditions.  Restless legs syndrome, sleep apnea, or other sleep disorders.  Chronic pain.  Menopause. This may include hot flashes.  Stroke.  Abuse of alcohol, tobacco, or illegal drugs.  Depression.  Caffeine.  Neurological disorders,  such as Alzheimer disease.  An overactive thyroid (hyperthyroidism).  The cause of insomnia may not be known. What increases the risk? Risk factors for insomnia include:  Gender. Women are more commonly affected than men.  Age. Insomnia is more common as you get older.  Stress. This may involve your professional or personal life.  Income. Insomnia is more common in people with lower income.  Lack of exercise.  Irregular work schedule or night shifts.  Traveling between different time zones.  What are  the signs or symptoms? If you have insomnia, trouble falling asleep or trouble staying asleep is the main symptom. This may lead to other symptoms, such as:  Feeling fatigued.  Feeling nervous about going to sleep.  Not feeling rested in the morning.  Having trouble concentrating.  Feeling irritable, anxious, or depressed.  How is this treated? Treatment for insomnia depends on the cause. If your insomnia is caused by an underlying condition, treatment will focus on addressing the condition. Treatment may also include:  Medicines to help you sleep.  Counseling or therapy.  Lifestyle adjustments.  Follow these instructions at home:  Take medicines only as directed by your health care provider.  Keep regular sleeping and waking hours. Avoid naps.  Keep a sleep diary to help you and your health care provider figure out what could be causing your insomnia. Include: ? When you sleep. ? When you wake up during the night. ? How well you sleep. ? How rested you feel the next day. ? Any side effects of medicines you are taking. ? What you eat and drink.  Make your bedroom a comfortable place where it is easy to fall asleep: ? Put up shades or special blackout curtains to block light from outside. ? Use a white noise machine to block noise. ? Keep the temperature cool.  Exercise regularly as directed by your health care provider. Avoid exercising right before bedtime.  Use relaxation techniques to manage stress. Ask your health care provider to suggest some techniques that may work well for you. These may include: ? Breathing exercises. ? Routines to release muscle tension. ? Visualizing peaceful scenes.  Cut back on alcohol, caffeinated beverages, and cigarettes, especially close to bedtime. These can disrupt your sleep.  Do not overeat or eat spicy foods right before bedtime. This can lead to digestive discomfort that can make it hard for you to sleep.  Limit screen use  before bedtime. This includes: ? Watching TV. ? Using your smartphone, tablet, and computer.  Stick to a routine. This can help you fall asleep faster. Try to do a quiet activity, brush your teeth, and go to bed at the same time each night.  Get out of bed if you are still awake after 15 minutes of trying to sleep. Keep the lights down, but try reading or doing a quiet activity. When you feel sleepy, go back to bed.  Make sure that you drive carefully. Avoid driving if you feel very sleepy.  Keep all follow-up appointments as directed by your health care provider. This is important. Contact a health care provider if:  You are tired throughout the day or have trouble in your daily routine due to sleepiness.  You continue to have sleep problems or your sleep problems get worse. Get help right away if:  You have serious thoughts about hurting yourself or someone else. This information is not intended to replace advice given to you by your health care provider. Make sure you  discuss any questions you have with your health care provider. Document Released: 06/23/2000 Document Revised: 11/26/2015 Document Reviewed: 03/27/2014 Elsevier Interactive Patient Education  2018 [employer].  Please continue all current medications, with two changes- Increase Metformin to 1000mg  twice daily (instead of 500mg  three times daily) Start Lorazepam 1mg - 1/2 to 1 tab nightly as needed for insomnia. Continue excellent water intake and follow Mediterranean diet. Continue to check blood sugar.  If A1c is still above goal, we will start another antidiabetic medication. Follow-up in 3 months.  NICE TO SEE YOU!

## 2017-11-29 NOTE — Assessment & Plan Note (Signed)
BP 131/81 HR 92 Continue Amlodipine 5mg  QD, Lisinopril 40mg  QD

## 2017-11-29 NOTE — Assessment & Plan Note (Signed)
Lab Results  Component Value Date   HGBA1C 8.0 (A) 11/29/2017   HGBA1C 7.2 06/21/2017   HGBA1C 8.5 (H) 02/19/2017   Increased Metformin to 1000mg  BID If A1c still above goal at f/u in 3 months, will start GLP-1 Microalbumin- normal Continue to check BS

## 2017-11-29 NOTE — Assessment & Plan Note (Signed)
>>  ASSESSMENT AND PLAN FOR BENIGN ESSENTIAL HYPERTENSION WRITTEN ON 11/29/2017  4:48 PM BY DANFORD, KATY D, NP  BP 131/81 HR 92 Continue Amlodipine 5mg  QD, Lisinopril 40mg  QD

## 2018-01-07 ENCOUNTER — Other Ambulatory Visit: Payer: Self-pay | Admitting: Adult Health

## 2018-01-28 ENCOUNTER — Other Ambulatory Visit: Payer: Self-pay | Admitting: Adult Health

## 2018-02-03 ENCOUNTER — Other Ambulatory Visit: Payer: Self-pay | Admitting: Adult Health

## 2018-02-09 ENCOUNTER — Other Ambulatory Visit: Payer: Self-pay | Admitting: Adult Health

## 2018-02-16 ENCOUNTER — Other Ambulatory Visit: Payer: Self-pay | Admitting: Adult Health

## 2018-02-18 ENCOUNTER — Other Ambulatory Visit: Payer: Self-pay

## 2018-02-18 DIAGNOSIS — I10 Essential (primary) hypertension: Secondary | ICD-10-CM

## 2018-02-18 MED ORDER — AMLODIPINE BESYLATE 5 MG PO TABS: ORAL_TABLET | ORAL | 0 refills | Status: DC

## 2018-02-27 ENCOUNTER — Encounter: Payer: Self-pay | Admitting: Adult Health

## 2018-02-27 ENCOUNTER — Ambulatory Visit (INDEPENDENT_AMBULATORY_CARE_PROVIDER_SITE_OTHER): Payer: BLUE CROSS/BLUE SHIELD | Admitting: Adult Health

## 2018-02-27 VITALS — BP 135/82 | HR 103 | Resp 17 | Wt 333.0 lb

## 2018-02-27 DIAGNOSIS — I10 Essential (primary) hypertension: Secondary | ICD-10-CM | POA: Diagnosis not present

## 2018-02-27 DIAGNOSIS — Z0000 Encounter for general adult medical examination without abnormal findings: Secondary | ICD-10-CM | POA: Diagnosis not present

## 2018-02-27 DIAGNOSIS — E119 Type 2 diabetes mellitus without complications: Secondary | ICD-10-CM | POA: Diagnosis not present

## 2018-02-27 DIAGNOSIS — E1169 Type 2 diabetes mellitus with other specified complication: Secondary | ICD-10-CM

## 2018-02-27 DIAGNOSIS — E785 Hyperlipidemia, unspecified: Secondary | ICD-10-CM

## 2018-02-27 DIAGNOSIS — G4700 Insomnia, unspecified: Secondary | ICD-10-CM

## 2018-02-27 MED ORDER — TRAZODONE HCL 50 MG PO TABS: 25.0000 mg | ORAL_TABLET | Freq: Every evening | ORAL | 3 refills | Status: DC | PRN

## 2018-02-27 NOTE — Assessment & Plan Note (Signed)
>>  ASSESSMENT AND PLAN FOR HYPERLIPIDEMIA ASSOCIATED WITH TYPE 2 DIABETES MELLITUS ([facility]) WRITTEN ON 02/27/2018  4:52 PM BY DANFORD, Orpha Bur D, NP  Fasting labs ordered

## 2018-02-27 NOTE — Assessment & Plan Note (Signed)
Continue all medications as directed. Stop Lorazepam and start Trazodone 25-50mg  nightly for insomnia. Continue to drink plenty of water and follow diabetic diet. Continue to increase regular exercise- great job on the weight loss! Great job on the A1c- 7.3 Please return in next few weeks for fasting labs and 3 months for regular follow-up.

## 2018-02-27 NOTE — Assessment & Plan Note (Signed)
Fasting labs ordered

## 2018-02-27 NOTE — Patient Instructions (Signed)
Diabetes Mellitus and Nutrition When you have diabetes (diabetes mellitus), it is very important to have healthy eating habits because your blood sugar (glucose) levels are greatly affected by what you eat and drink. Eating healthy foods in the appropriate amounts, at about the same times every day, can help you:  Control your blood glucose.  Lower your risk of heart disease.  Improve your blood pressure.  Reach or maintain a healthy weight.  Every person with diabetes is different, and each person has different needs for a meal plan. Your health care provider may recommend that you work with a diet and nutrition specialist (dietitian) to make a meal plan that is best for you. Your meal plan may vary depending on factors such as:  The calories you need.  The medicines you take.  Your weight.  Your blood glucose, blood pressure, and cholesterol levels.  Your activity level.  Other health conditions you have, such as heart or kidney disease.  How do carbohydrates affect me? Carbohydrates affect your blood glucose level more than any other type of food. Eating carbohydrates naturally increases the amount of glucose in your blood. Carbohydrate counting is a method for keeping track of how many carbohydrates you eat. Counting carbohydrates is important to keep your blood glucose at a healthy level, especially if you use insulin or take certain oral diabetes medicines. It is important to know how many carbohydrates you can safely have in each meal. This is different for every person. Your dietitian can help you calculate how many carbohydrates you should have at each meal and for snack. Foods that contain carbohydrates include:  Bread, cereal, rice, pasta, and crackers.  Potatoes and corn.  Peas, beans, and lentils.  Milk and yogurt.  Fruit and juice.  Desserts, such as cakes, cookies, ice cream, and candy.  How does alcohol affect me? Alcohol can cause a sudden decrease in blood  glucose (hypoglycemia), especially if you use insulin or take certain oral diabetes medicines. Hypoglycemia can be a life-threatening condition. Symptoms of hypoglycemia (sleepiness, dizziness, and confusion) are similar to symptoms of having too much alcohol. If your health care provider says that alcohol is safe for you, follow these guidelines:  Limit alcohol intake to no more than 1 drink per day for nonpregnant women and 2 drinks per day for men. One drink equals 12 oz of beer, 5 oz of wine, or 1 oz of hard liquor.  Do not drink on an empty stomach.  Keep yourself hydrated with water, diet soda, or unsweetened iced tea.  Keep in mind that regular soda, juice, and other mixers may contain a lot of sugar and must be counted as carbohydrates.  What are tips for following this plan? Reading food labels  Start by checking the serving size on the label. The amount of calories, carbohydrates, fats, and other nutrients listed on the label are based on one serving of the food. Many foods contain more than one serving per package.  Check the total grams (g) of carbohydrates in one serving. You can calculate the number of servings of carbohydrates in one serving by dividing the total carbohydrates by 15. For example, if a food has 30 g of total carbohydrates, it would be equal to 2 servings of carbohydrates.  Check the number of grams (g) of saturated and trans fats in one serving. Choose foods that have low or no amount of these fats.  Check the number of milligrams (mg) of sodium in one serving. Most people   should limit total sodium intake to less than 2,300 mg per day.  Always check the nutrition information of foods labeled as "low-fat" or "nonfat". These foods may be higher in added sugar or refined carbohydrates and should be avoided.  Talk to your dietitian to identify your daily goals for nutrients listed on the label. Shopping  Avoid buying canned, premade, or processed foods. These  foods tend to be high in fat, sodium, and added sugar.  Shop around the outside edge of the grocery store. This includes fresh fruits and vegetables, bulk grains, fresh meats, and fresh dairy. Cooking  Use low-heat cooking methods, such as baking, instead of high-heat cooking methods like deep frying.  Cook using healthy oils, such as olive, canola, or sunflower oil.  Avoid cooking with butter, cream, or high-fat meats. Meal planning  Eat meals and snacks regularly, preferably at the same times every day. Avoid going long periods of time without eating.  Eat foods high in fiber, such as fresh fruits, vegetables, beans, and whole grains. Talk to your dietitian about how many servings of carbohydrates you can eat at each meal.  Eat 4-6 ounces of lean protein each day, such as lean meat, chicken, fish, eggs, or tofu. 1 ounce is equal to 1 ounce of meat, chicken, or fish, 1 egg, or 1/4 cup of tofu.  Eat some foods each day that contain healthy fats, such as avocado, nuts, seeds, and fish. Lifestyle   Check your blood glucose regularly.  Exercise at least 30 minutes 5 or more days each week, or as told by your health care provider.  Take medicines as told by your health care provider.  Do not use any products that contain nicotine or tobacco, such as cigarettes and e-cigarettes. If you need help quitting, ask your health care provider.  Work with a Social worker or diabetes educator to identify strategies to manage stress and any emotional and social challenges. What are some questions to ask my health care provider?  Do I need to meet with a diabetes educator?  Do I need to meet with a dietitian?  What number can I call if I have questions?  When are the best times to check my blood glucose? Where to find more information:  American Diabetes Association: diabetes.org/food-and-fitness/food  Academy of Nutrition and Dietetics:  PokerClues.dk  [employer] of Diabetes and Digestive and Kidney Diseases (NIH): ContactWire.be Summary  A healthy meal plan will help you control your blood glucose and maintain a healthy lifestyle.  Working with a diet and nutrition specialist (dietitian) can help you make a meal plan that is best for you.  Keep in mind that carbohydrates and alcohol have immediate effects on your blood glucose levels. It is important to count carbohydrates and to use alcohol carefully. This information is not intended to replace advice given to you by your health care provider. Make sure you discuss any questions you have with your health care provider. Document Released: 03/23/2005 Document Revised: 07/31/2016 Document Reviewed: 07/31/2016 Elsevier Interactive Patient Education  2018 [place] all medications as directed. Stop Lorazepam and start Trazodone 25-50mg  nightly for insomnia. Continue to drink plenty of water and follow diabetic diet. Continue to increase regular exercise- great job on the weight loss! Great job on the A1c- 7.3 Please return in next few weeks for fasting labs and 3 months for regular follow-up. NICE TO SEE YOU!

## 2018-02-27 NOTE — Assessment & Plan Note (Addendum)
Lab Results  Component Value Date   HGBA1C 8.0 (A) 11/29/2017   HGBA1C 7.2 06/21/2017   HGBA1C 8.5 (H) 02/19/2017   Continue Metformin 1000mg  Increase regular exercise

## 2018-02-27 NOTE — Assessment & Plan Note (Signed)
Stop Lorazepam  Start Trazodone 50mg - 1/2 - 1 tablet QHS PRN

## 2018-02-27 NOTE — Progress Notes (Signed)
Subjective:    Patient ID: Eddie Hancock, male    DOB: 1971-06-25, 47 y.o.   MRN: [id number]  HPI: 02/12/17 OV:  Eddie Hancock is here for f/u: HTN.  He has been compliant on all medications and denies SE.  He has been exercising 5 days a week-40 mins on elliptical machine in basement.  He has dramatically reduced saturated fat/cho/sguar from diet and completely eliminated tobacco/EOTH.  He reports am bs 160-170s, despite low cho/cal meals in evening. He denies episodes of hypoglycemia.    12/13/4 OV: Eddie Hancock is here for f/u: HTN, HL, T2D, Obesity, and flare up of lumbar back pain that developed spontaneously 2 weeks ago. Pain is constant, dull ache and rated 6/10.  He denies acute injury/accident prior to onset of pain. He reports medication compliance and denies SE.  He reports am BS 140-170s, and denies episodes of hypoglycemia. He has slacked off the biking but still riding 40 mins 1-2 times/week. He has reduced frequency of alcohol- now only once/month- however he consumes a large amount when he does- 1/5 liquor and 12 beers.  He denies ETOH use causing problems with personal relationships/work responsibilities .  11/28/17 OV: Eddie Hancock is here for regular f/u:  HTN, HLD, T2D, and obesity He reports medication compliance, denies SE He reports AM 180-200s, postprandials 190-215 He denies episodes of hypoglycemia. He drinks >galoon water/day and tries to limit saturated fat/sugar/CHO He denies tobacco use and continues to limit ETOH use.  He know only consumes ETOH once every 6-8 weeks, however it is still a large amount when he does drink (>12 beers) He reports worsening insomnia, with "inability to turn off thoughts".  02/27/18 OV: Eddie Hancock presents for f/u: HTN. HLD, T2D, Obesity He reports medication compliance, denies SE He has been intermittently fasting (8hrs eating, 16 hrs w/o food) the last 3 weeks He has sig reduced sugar/CHO/saturated fat and been increasing cardio (ie  elliptical). He estimates to drink <24 beers/month He estimates to smoke pack of cigarettes/month He has lost >5 lbs since last OV May 2019 He reports AM BS 120-150s, denies episodes of hypoglycemia  He reports that Lorazepam was not effective in treating his insomnia Lab Results  Component Value Date   HGBA1C 8.0 (A) 11/29/2017   HGBA1C 7.2 06/21/2017   HGBA1C 8.5 (H) 02/19/2017   Patient Care Team    Relationship Specialty Notifications Start End  Esaw Grandchild, NP PCP - General Family Medicine  10/17/16     Patient Active Problem List   Diagnosis Date Noted  . Insomnia 11/29/2017  . Diabetes mellitus without complication ([place]) 66/29/4765  . Hyperlipidemia associated with type 2 diabetes mellitus ([place]) 06/21/2017  . Healthcare maintenance 06/21/2017  . Contact dermatitis 10/17/2016  . Benign essential hypertension 11/04/2015  . Chronic low back pain without sciatica 11/04/2015  . Hyperglycemia 11/04/2015  . Hypertriglyceridemia 11/04/2015  . Seasonal allergic rhinitis due to pollen 11/04/2015     Past Medical History:  Diagnosis Date  . Diabetes mellitus without complication ([place])   . Hypertension      Past Surgical History:  Procedure Laterality Date  . APPENDECTOMY       Family History  Problem Relation Age of Onset  . Hypertension Mother   . Diabetes Daughter   . Learning disabilities Maternal Grandmother   . Learning disabilities Maternal Grandfather      Social History   Substance and Sexual Activity  Drug Use No     Social History  Substance and Sexual Activity  Alcohol Use No     Social History   Tobacco Use  Smoking Status Former Smoker  . Last attempt to quit: 11/07/2016  . Years since quitting: 1.3  Smokeless Tobacco Never Used     Outpatient Encounter Medications as of 02/27/2018  Medication Sig  . allopurinol (ZYLOPRIM) 300 MG tablet TAKE 1 TABLET(300 MG) BY MOUTH DAILY  . amLODipine (NORVASC) 5 MG tablet TAKE 1 TABLET(5  MG) BY MOUTH DAILY  . atorvastatin (LIPITOR) 10 MG tablet TAKE 2 TABLETS(20 MG) BY MOUTH DAILY  . Blood Glucose Monitoring Suppl (CONTOUR NEXT ONE) KIT 1 each by Does not apply route 2 (two) times daily. Check fasting blood sugar in morning and then recheck blood sugar after largest meal.  . cyclobenzaprine (FLEXERIL) 10 MG tablet TAKE 1 TABLET(10 MG) BY MOUTH THREE TIMES DAILY AS NEEDED FOR MUSCLE SPASMS  . ezetimibe (ZETIA) 10 MG tablet TAKE 1 TABLET(10 MG) BY MOUTH DAILY  . glucose blood test strip Use to check blood sugars three times daily  . lisinopril (PRINIVIL,ZESTRIL) 40 MG tablet TAKE 1 TABLET(40 MG) BY MOUTH DAILY  . meloxicam (MOBIC) 7.5 MG tablet Take 1 tablet (7.5 mg total) by mouth 2 (two) times daily with a meal.  . metFORMIN (GLUCOPHAGE) 1000 MG tablet Take 1 tablet (1,000 mg total) by mouth 2 (two) times daily with a meal.  . omega-3 acid ethyl esters (LOVAZA) 1 g capsule TAKE 2 CAPSULES BY MOUTH TWICE DAILY  . ranitidine (ZANTAC) 300 MG tablet Take 300 mg by mouth daily as needed for heartburn.  . [DISCONTINUED] LORazepam (ATIVAN) 1 MG tablet Take 1/2 to 1 tab at bedtime,as needed for insomnia.  Marland Kitchen traZODone (DESYREL) 50 MG tablet Take 0.5-1 tablets (25-50 mg total) by mouth at bedtime as needed for sleep.   No facility-administered encounter medications on file as of 02/27/2018.     Allergies: Cetirizine  Body mass index is 44.85 kg/m.  Blood pressure 135/82, pulse (!) 103, resp. rate 17, weight (!) 333 lb (151 kg), SpO2 96 %.  Review of Systems  Constitutional: Positive for fatigue. Negative for activity change, appetite change, chills, diaphoresis, fever and unexpected weight change.  Eyes: Negative for visual disturbance.  Respiratory: Negative for cough, chest tightness, shortness of breath and wheezing.   Cardiovascular: Negative for chest pain, palpitations and leg swelling.  Gastrointestinal: Negative for abdominal distention, abdominal pain, blood in stool,  constipation, diarrhea, nausea and vomiting.  Endocrine: Negative for cold intolerance, heat intolerance, polydipsia, polyphagia and polyuria.  Genitourinary: Negative for difficulty urinating and flank pain.  Musculoskeletal: Positive for back pain.  Skin: Negative for color change, pallor, rash and wound.  Allergic/Immunologic: Negative for immunocompromised state.  Neurological: Negative for dizziness and headaches.  Hematological: Does not bruise/bleed easily.  Psychiatric/Behavioral: Negative for sleep disturbance.       Objective:   Physical Exam  Constitutional: He is oriented to person, place, and time. He appears well-developed and well-nourished. No distress.  HENT:  Head: Normocephalic and atraumatic.  Right Ear: External ear normal.  Left Ear: External ear normal.  Eyes: Pupils are equal, round, and reactive to light. Conjunctivae are normal.  Cardiovascular: Normal rate, regular rhythm, normal heart sounds and intact distal pulses.  No murmur heard. Pulmonary/Chest: Effort normal and breath sounds normal. No respiratory distress. He has no wheezes. He has no rales. He exhibits no tenderness.  Musculoskeletal: Normal range of motion. He exhibits tenderness.       Lumbar  back: He exhibits tenderness and pain. He exhibits normal range of motion.  Neurological: He is alert and oriented to person, place, and time. Coordination normal.  Skin: Skin is warm and dry. No rash noted. He is not diaphoretic. No erythema. No pallor.  Psychiatric: He has a normal mood and affect. His behavior is normal. Judgment and thought content normal.  Nursing note and vitals reviewed.     Assessment & Plan:   1. Benign essential hypertension   2. Healthcare maintenance   3. Diabetes mellitus without complication ([place])   4. Hyperlipidemia associated with type 2 diabetes mellitus ([place])   5. Insomnia, unspecified type     Healthcare maintenance Continue all medications as directed. Stop  Lorazepam and start Trazodone 25-74m nightly for insomnia. Continue to drink plenty of water and follow diabetic diet. Continue to increase regular exercise- great job on the weight loss! Great job on the A1c- 7.3 Please return in next few weeks for fasting labs and 3 months for regular follow-up.  [place] Lab Results  Component Value Date   HGBA1C 8.0 (A) 11/29/2017   HGBA1C 7.2 06/21/2017   HGBA1C 8.5 (H) 02/19/2017   Continue Metformin 10065mIncrease regular exercise  Hyperlipidemia associated with type 2 diabetes mellitus ([place]   Insomnia Stop Lorazepam  Start Trazodone 5071m1/2 - 1 tablet QHS PRN    FOLLOW-UP:  Return in about 3 months (around 05/30/2018) for Regular Follow Up, HTN, Insomnia, Obesity, Diabetes.

## 2018-03-25 ENCOUNTER — Other Ambulatory Visit: Payer: Self-pay

## 2018-03-25 ENCOUNTER — Other Ambulatory Visit: Payer: BLUE CROSS/BLUE SHIELD

## 2018-03-25 DIAGNOSIS — E119 Type 2 diabetes mellitus without complications: Secondary | ICD-10-CM

## 2018-03-25 DIAGNOSIS — E781 Pure hyperglyceridemia: Secondary | ICD-10-CM

## 2018-03-25 DIAGNOSIS — Z0000 Encounter for general adult medical examination without abnormal findings: Secondary | ICD-10-CM

## 2018-03-25 DIAGNOSIS — E1169 Type 2 diabetes mellitus with other specified complication: Secondary | ICD-10-CM

## 2018-03-25 DIAGNOSIS — E785 Hyperlipidemia, unspecified: Secondary | ICD-10-CM

## 2018-03-25 DIAGNOSIS — I10 Essential (primary) hypertension: Secondary | ICD-10-CM

## 2018-04-08 ENCOUNTER — Other Ambulatory Visit: Payer: BLUE CROSS/BLUE SHIELD

## 2018-04-08 DIAGNOSIS — I10 Essential (primary) hypertension: Secondary | ICD-10-CM

## 2018-04-08 DIAGNOSIS — Z0000 Encounter for general adult medical examination without abnormal findings: Secondary | ICD-10-CM | POA: Diagnosis not present

## 2018-04-08 DIAGNOSIS — E119 Type 2 diabetes mellitus without complications: Secondary | ICD-10-CM | POA: Diagnosis not present

## 2018-04-08 DIAGNOSIS — E781 Pure hyperglyceridemia: Secondary | ICD-10-CM

## 2018-04-08 DIAGNOSIS — E1169 Type 2 diabetes mellitus with other specified complication: Secondary | ICD-10-CM

## 2018-04-08 DIAGNOSIS — E785 Hyperlipidemia, unspecified: Secondary | ICD-10-CM

## 2018-04-09 ENCOUNTER — Other Ambulatory Visit: Payer: Self-pay | Admitting: Adult Health

## 2018-04-09 LAB — HEMOGLOBIN A1C
Est. average glucose Bld gHb Est-mCnc: 163 mg/dL
Hgb A1c MFr Bld: 7.3 % — ABNORMAL HIGH (ref 4.8–5.6)

## 2018-04-09 LAB — COMPREHENSIVE METABOLIC PANEL
ALBUMIN: 4.5 g/dL (ref 3.5–5.5)
ALT: 37 IU/L (ref 0–44)
AST: 15 IU/L (ref 0–40)
Albumin/Globulin Ratio: 2.4 — ABNORMAL HIGH (ref 1.2–2.2)
Alkaline Phosphatase: 67 IU/L (ref 39–117)
BUN/Creatinine Ratio: 14 (ref 9–20)
BUN: 13 mg/dL (ref 6–24)
Bilirubin Total: 0.9 mg/dL (ref 0.0–1.2)
CALCIUM: 9.4 mg/dL (ref 8.7–10.2)
CO2: 23 mmol/L (ref 20–29)
Chloride: 102 mmol/L (ref 96–106)
Creatinine, Ser: 0.93 mg/dL (ref 0.76–1.27)
GFR, EST AFRICAN AMERICAN: 113 mL/min/{1.73_m2} (ref >59)
GFR, EST NON AFRICAN AMERICAN: 98 mL/min/{1.73_m2} (ref >59)
GLOBULIN, TOTAL: 1.9 g/dL (ref 1.5–4.5)
Glucose: 215 mg/dL — ABNORMAL HIGH (ref 65–99)
Potassium: 5 mmol/L (ref 3.5–5.2)
SODIUM: 139 mmol/L (ref 134–144)
TOTAL PROTEIN: 6.4 g/dL (ref 6.0–8.5)

## 2018-04-09 LAB — LIPID PANEL
CHOLESTEROL TOTAL: 82 mg/dL — AB (ref 100–199)
Chol/HDL Ratio: 2.6 ratio (ref 0.0–5.0)
HDL: 32 mg/dL — AB (ref >39)
LDL Calculated: 25 mg/dL (ref 0–99)
Triglycerides: 126 mg/dL (ref 0–149)
VLDL Cholesterol Cal: 25 mg/dL (ref 5–40)

## 2018-04-09 LAB — CBC WITH DIFFERENTIAL/PLATELET
BASOS: 1 %
Basophils Absolute: 0 10*3/uL (ref 0.0–0.2)
EOS (ABSOLUTE): 0.4 10*3/uL (ref 0.0–0.4)
EOS: 5 %
HEMATOCRIT: 43.4 % (ref 37.5–51.0)
Hemoglobin: 15.1 g/dL (ref 13.0–17.7)
IMMATURE GRANS (ABS): 0 10*3/uL (ref 0.0–0.1)
IMMATURE GRANULOCYTES: 0 %
Lymphocytes Absolute: 2.7 10*3/uL (ref 0.7–3.1)
Lymphs: 34 %
MCH: 30.7 pg (ref 26.6–33.0)
MCHC: 34.8 g/dL (ref 31.5–35.7)
MCV: 88 fL (ref 79–97)
MONOS ABS: 0.7 10*3/uL (ref 0.1–0.9)
Monocytes: 8 %
NEUTROS PCT: 52 %
Neutrophils Absolute: 4 10*3/uL (ref 1.4–7.0)
Platelets: 241 10*3/uL (ref 150–450)
RBC: 4.92 x10E6/uL (ref 4.14–5.80)
RDW: 13.1 % (ref 12.3–15.4)
WBC: 7.8 10*3/uL (ref 3.4–10.8)

## 2018-04-09 LAB — TSH: TSH: 2.58 u[IU]/mL (ref 0.450–4.500)

## 2018-04-09 MED ORDER — ATORVASTATIN CALCIUM 10 MG PO TABS: 10.0000 mg | ORAL_TABLET | Freq: Every day | ORAL | 3 refills | Status: DC

## 2018-04-10 ENCOUNTER — Telehealth: Payer: Self-pay

## 2018-04-10 NOTE — Telephone Encounter (Signed)
Pt informed of lab test results.  Pt expressed understanding and is agreeable.  [name], CMA

## 2018-04-26 ENCOUNTER — Other Ambulatory Visit: Payer: Self-pay | Admitting: Adult Health

## 2018-04-30 ENCOUNTER — Other Ambulatory Visit: Payer: Self-pay | Admitting: Adult Health

## 2018-05-07 ENCOUNTER — Telehealth: Payer: Self-pay | Admitting: Adult Health

## 2018-05-07 ENCOUNTER — Other Ambulatory Visit: Payer: Self-pay | Admitting: Adult Health

## 2018-05-07 MED ORDER — INDOMETHACIN 50 MG PO CAPS: 50.0000 mg | ORAL_CAPSULE | Freq: Three times a day (TID) | ORAL | 0 refills | Status: AC

## 2018-05-07 NOTE — Telephone Encounter (Signed)
Pt's wife called for him states he is feeling an episode of Gout coming on & wants to block it before its onset-- Patient request Gout medication Rx refill.  Please send it to:  [facility], [place] [phone] (Phone) [phone] (Fax)   --Forwarding request to medical assistant.  --Dion Body

## 2018-05-07 NOTE — Telephone Encounter (Signed)
Patient's wife called back with the name of gout med he was taking, it is indomethacin and we have never prescribed this med for the patient

## 2018-05-07 NOTE — Telephone Encounter (Signed)
See additional phone note for today.  [name], CMA

## 2018-05-07 NOTE — Telephone Encounter (Signed)
Good Evening Tonya, Can you please call Ms. Rayford and share- I sent in Rx of Indomethacin to [facility], [place]  Thanks! Valetta Fuller

## 2018-05-07 NOTE — Telephone Encounter (Signed)
Pt's wife states he was previously on indomethacin.  [name], CMA

## 2018-05-08 NOTE — Telephone Encounter (Signed)
LVM informing pt of RX.  T. Leslea Vowles, CMA 

## 2018-05-17 ENCOUNTER — Other Ambulatory Visit: Payer: Self-pay | Admitting: Adult Health

## 2018-05-17 DIAGNOSIS — I10 Essential (primary) hypertension: Secondary | ICD-10-CM

## 2018-05-27 ENCOUNTER — Ambulatory Visit: Payer: BLUE CROSS/BLUE SHIELD | Admitting: Adult Health

## 2018-06-03 ENCOUNTER — Ambulatory Visit: Payer: BLUE CROSS/BLUE SHIELD | Admitting: Family Medicine

## 2018-06-17 ENCOUNTER — Ambulatory Visit: Payer: BLUE CROSS/BLUE SHIELD | Admitting: Adult Health

## 2018-07-04 NOTE — Progress Notes (Deleted)
Subjective:    Patient ID: Eddie Hancock, male    DOB: Sep 06, 1970, 47 y.o.   MRN: [id number]  HPI: 02/12/17 OV:  Mr. Eddie Hancock is here for f/u: HTN.  He has been compliant on all medications and denies SE.  He has been exercising 5 days a week-40 mins on elliptical machine in basement.  He has dramatically reduced saturated fat/cho/sguar from diet and completely eliminated tobacco/EOTH.  He reports am bs 160-170s, despite low cho/cal meals in evening. He denies episodes of hypoglycemia.    12/13/4 OV: Mr. Eddie Hancock is here for f/u: HTN, HL, T2D, Obesity, and flare up of lumbar back pain that developed spontaneously 2 weeks ago. Pain is constant, dull ache and rated 6/10.  He denies acute injury/accident prior to onset of pain. He reports medication compliance and denies SE.  He reports am BS 140-170s, and denies episodes of hypoglycemia. He has slacked off the biking but still riding 40 mins 1-2 times/week. He has reduced frequency of alcohol- now only once/month- however he consumes a large amount when he does- 1/5 liquor and 12 beers.  He denies ETOH use causing problems with personal relationships/work responsibilities .  11/28/17 OV: Mr. Eddie Hancock is here for regular f/u:  HTN, HLD, T2D, and obesity He reports medication compliance, denies SE He reports AM 180-200s, postprandials 190-215 He denies episodes of hypoglycemia. He drinks >galoon water/day and tries to limit saturated fat/sugar/CHO He denies tobacco use and continues to limit ETOH use.  He know only consumes ETOH once every 6-8 weeks, however it is still a large amount when he does drink (>12 beers) He reports worsening insomnia, with "inability to turn off thoughts".  02/27/18 OV: Mr. Eddie Hancock presents for f/u: HTN. HLD, T2D, Obesity He reports medication compliance, denies SE He has been intermittently fasting (8hrs eating, 16 hrs w/o food) the last 3 weeks He has sig reduced sugar/CHO/saturated fat and been increasing cardio (ie  elliptical). He estimates to drink <24 beers/month He estimates to smoke pack of cigarettes/month He has lost >5 lbs since last OV May 2019 He reports AM BS 120-150s, denies episodes of hypoglycemia  He reports that Lorazepam was not effective in treating his insomnia  07/08/18 OV: Mr. Eddie Hancock presents for 3 month f/u: HTN, HLD, T2D, Obesity  Patient Care Team    Relationship Specialty Notifications Start End  Esaw Grandchild, NP PCP - General Family Medicine  10/17/16     Patient Active Problem List   Diagnosis Date Noted  . Insomnia 11/29/2017  . Diabetes mellitus without complication ([place]) 03/55/9741  . Hyperlipidemia associated with type 2 diabetes mellitus ([place]) 06/21/2017  . Healthcare maintenance 06/21/2017  . Contact dermatitis 10/17/2016  . Benign essential hypertension 11/04/2015  . Chronic low back pain without sciatica 11/04/2015  . Hyperglycemia 11/04/2015  . Hypertriglyceridemia 11/04/2015  . Seasonal allergic rhinitis due to pollen 11/04/2015     Past Medical History:  Diagnosis Date  . Diabetes mellitus without complication ([place])   . Hypertension      Past Surgical History:  Procedure Laterality Date  . APPENDECTOMY       Family History  Problem Relation Age of Onset  . Hypertension Mother   . Diabetes Daughter   . Learning disabilities Maternal Grandmother   . Learning disabilities Maternal Grandfather      Social History   Substance and Sexual Activity  Drug Use No     Social History   Substance and Sexual Activity  Alcohol Use No  Social History   Tobacco Use  Smoking Status Former Smoker  . Last attempt to quit: 11/07/2016  . Years since quitting: 1.6  Smokeless Tobacco Never Used     Outpatient Encounter Medications as of 07/08/2018  Medication Sig  . allopurinol (ZYLOPRIM) 300 MG tablet TAKE 1 TABLET(300 MG) BY MOUTH DAILY  . amLODipine (NORVASC) 5 MG tablet TAKE 1 TABLET(5 MG) BY MOUTH DAILY  . atorvastatin (LIPITOR)  10 MG tablet Take 1 tablet (10 mg total) by mouth daily.  . Blood Glucose Monitoring Suppl (CONTOUR NEXT ONE) KIT 1 each by Does not apply route 2 (two) times daily. Check fasting blood sugar in morning and then recheck blood sugar after largest meal.  . cyclobenzaprine (FLEXERIL) 10 MG tablet TAKE 1 TABLET(10 MG) BY MOUTH THREE TIMES DAILY AS NEEDED FOR MUSCLE SPASMS  . ezetimibe (ZETIA) 10 MG tablet TAKE 1 TABLET(10 MG) BY MOUTH DAILY  . glucose blood test strip Use to check blood sugars three times daily  . lisinopril (PRINIVIL,ZESTRIL) 40 MG tablet TAKE 1 TABLET(40 MG) BY MOUTH DAILY  . meloxicam (MOBIC) 7.5 MG tablet Take 1 tablet (7.5 mg total) by mouth 2 (two) times daily with a meal.  . metFORMIN (GLUCOPHAGE) 1000 MG tablet Take 1 tablet (1,000 mg total) by mouth 2 (two) times daily with a meal.  . omega-3 acid ethyl esters (LOVAZA) 1 g capsule TAKE 2 CAPSULES BY MOUTH TWICE DAILY  . ranitidine (ZANTAC) 300 MG tablet Take 300 mg by mouth daily as needed for heartburn.  . traZODone (DESYREL) 50 MG tablet Take 0.5-1 tablets (25-50 mg total) by mouth at bedtime as needed for sleep.   No facility-administered encounter medications on file as of 07/08/2018.     Allergies: Cetirizine  There is no height or weight on file to calculate BMI.  There were no vitals taken for this visit.  Review of Systems  Constitutional: Positive for fatigue. Negative for activity change, appetite change, chills, diaphoresis, fever and unexpected weight change.  Eyes: Negative for visual disturbance.  Respiratory: Negative for cough, chest tightness, shortness of breath and wheezing.   Cardiovascular: Negative for chest pain, palpitations and leg swelling.  Gastrointestinal: Negative for abdominal distention, abdominal pain, blood in stool, constipation, diarrhea, nausea and vomiting.  Endocrine: Negative for cold intolerance, heat intolerance, polydipsia, polyphagia and polyuria.  Genitourinary: Negative  for difficulty urinating and flank pain.  Musculoskeletal: Positive for back pain.  Skin: Negative for color change, pallor, rash and wound.  Allergic/Immunologic: Negative for immunocompromised state.  Neurological: Negative for dizziness and headaches.  Hematological: Does not bruise/bleed easily.  Psychiatric/Behavioral: Negative for sleep disturbance.       Objective:   Physical Exam  Constitutional: He is oriented to person, place, and time. He appears well-developed and well-nourished. No distress.  HENT:  Head: Normocephalic and atraumatic.  Right Ear: External ear normal.  Left Ear: External ear normal.  Eyes: Pupils are equal, round, and reactive to light. Conjunctivae are normal.  Cardiovascular: Normal rate, regular rhythm, normal heart sounds and intact distal pulses.  No murmur heard. Pulmonary/Chest: Effort normal and breath sounds normal. No respiratory distress. He has no wheezes. He has no rales. He exhibits no tenderness.  Musculoskeletal: Normal range of motion.        General: Tenderness present.     Lumbar back: He exhibits tenderness and pain. He exhibits normal range of motion.  Neurological: He is alert and oriented to person, place, and time. Coordination normal.  Skin:  Skin is warm and dry. No rash noted. He is not diaphoretic. No erythema. No pallor.  Psychiatric: He has a normal mood and affect. His behavior is normal. Judgment and thought content normal.  Nursing note and vitals reviewed.     Assessment & Plan:   No diagnosis found.  No problem-specific Assessment & Plan notes found for this encounter.    FOLLOW-UP:  No follow-ups on file.

## 2018-07-08 ENCOUNTER — Ambulatory Visit: Payer: BLUE CROSS/BLUE SHIELD | Admitting: Adult Health

## 2018-07-09 NOTE — Progress Notes (Signed)
Subjective:    Patient ID: Eddie Hancock, male    DOB: 20-Dec-1970, 47 y.o.   MRN: [id number]  HPI: 02/12/17 OV:  Eddie Hancock is here for f/u: HTN.  He has been compliant on all medications and denies SE.  He has been exercising 5 days a week-40 mins on elliptical machine in basement.  He has dramatically reduced saturated fat/cho/sguar from diet and completely eliminated tobacco/EOTH.  He reports am bs 160-170s, despite low cho/cal meals in evening. He denies episodes of hypoglycemia.    12/13/4 OV: Eddie Hancock is here for f/u: HTN, HL, T2D, Obesity, and flare up of lumbar back pain that developed spontaneously 2 weeks ago. Pain is constant, dull ache and rated 6/10.  He denies acute injury/accident prior to onset of pain. He reports medication compliance and denies SE.  He reports am BS 140-170s, and denies episodes of hypoglycemia. He has slacked off the biking but still riding 40 mins 1-2 times/week. He has reduced frequency of alcohol- now only once/month- however he consumes a large amount when he does- 1/5 liquor and 12 beers.  He denies ETOH use causing problems with personal relationships/work responsibilities .  11/28/17 OV: Eddie Hancock is here for regular f/u:  HTN, HLD, T2D, and obesity He reports medication compliance, denies SE He reports AM 180-200s, postprandials 190-215 He denies episodes of hypoglycemia. He drinks >galoon water/day and tries to limit saturated fat/sugar/CHO He denies tobacco use and continues to limit ETOH use.  He know only consumes ETOH once every 6-8 weeks, however it is still a large amount when he does drink (>12 beers) He reports worsening insomnia, with "inability to turn off thoughts".  02/27/18 OV: Eddie Hancock presents for f/u: HTN. HLD, T2D, Obesity He reports medication compliance, denies SE He has been intermittently fasting (8hrs eating, 16 hrs w/o food) the last 3 weeks He has sig reduced sugar/CHO/saturated fat and been increasing cardio (ie  elliptical). He estimates to drink <24 beers/month He estimates to smoke pack of cigarettes/month He has lost >5 lbs since last OV May 2019 He reports AM BS 120-150s, denies episodes of hypoglycemia  He reports that Lorazepam was not effective in treating his insomnia  07/15/18 OV: Eddie Hancock presents for f/u: HTN, T2D, HLD, Obesity He reports AM BS 140-160s HE denies episodes of hypoglycemia Currently only on metformin 1024m BIG Plans on resuming regular exercise- 5 days/week since his wife earned her drivers license he states "I have a lot more time now" He has stopped all tobacco/ETOH use He has lost 5 lbs since last OV in August He reports insomnia even with Trazodone He was previously tried on lorazepam  Discussed trying on new to [employer] may require PA Foot Exam-normal Patient Care Team    Relationship Specialty Notifications Start End  Daishon Chui, KBerna Spare NP PCP - General Family Medicine  10/17/16     Patient Active Problem List   Diagnosis Date Noted  . BMI 40.0-44.9, adult ([place] 07/15/2018  . Morbid obesity ([place] 07/15/2018  . Insomnia 11/29/2017  . Diabetes mellitus without complication ([place] 178/67/6720 . Hyperlipidemia associated with type 2 diabetes mellitus ([place] 06/21/2017  . Healthcare maintenance 06/21/2017  . Contact dermatitis 10/17/2016  . Benign essential hypertension 11/04/2015  . Chronic low back pain without sciatica 11/04/2015  . Hyperglycemia 11/04/2015  . Hypertriglyceridemia 11/04/2015  . Seasonal allergic rhinitis due to pollen 11/04/2015     Past Medical History:  Diagnosis Date  . Diabetes mellitus without complication (  [place])   . Hypertension      Past Surgical History:  Procedure Laterality Date  . APPENDECTOMY       Family History  Problem Relation Age of Onset  . Hypertension Mother   . Diabetes Daughter   . Learning disabilities Maternal Grandmother   . Learning disabilities Maternal Grandfather      Social History    Substance and Sexual Activity  Drug Use No     Social History   Substance and Sexual Activity  Alcohol Use No     Social History   Tobacco Use  Smoking Status Former Smoker  . Last attempt to quit: 11/07/2016  . Years since quitting: 1.6  Smokeless Tobacco Never Used     Outpatient Encounter Medications as of 07/15/2018  Medication Sig  . allopurinol (ZYLOPRIM) 300 MG tablet TAKE 1 TABLET(300 MG) BY MOUTH DAILY  . amLODipine (NORVASC) 5 MG tablet TAKE 1 TABLET(5 MG) BY MOUTH DAILY  . atorvastatin (LIPITOR) 10 MG tablet Take 1/2 tablet nightly  . Blood Glucose Monitoring Suppl (CONTOUR NEXT ONE) KIT 1 each by Does not apply route 2 (two) times daily. Check fasting blood sugar in morning and then recheck blood sugar after largest meal.  . cyclobenzaprine (FLEXERIL) 10 MG tablet TAKE 1 TABLET(10 MG) BY MOUTH THREE TIMES DAILY AS NEEDED FOR MUSCLE SPASMS  . ezetimibe (ZETIA) 10 MG tablet TAKE 1 TABLET(10 MG) BY MOUTH DAILY  . famotidine (PEPCID) 20 MG tablet Take 20 mg by mouth daily.  Marland Kitchen glucose blood test strip Use to check blood sugars three times daily  . lisinopril (PRINIVIL,ZESTRIL) 40 MG tablet TAKE 1 TABLET(40 MG) BY MOUTH DAILY  . meloxicam (MOBIC) 7.5 MG tablet Take 1 tablet (7.5 mg total) by mouth 2 (two) times daily with a meal.  . metFORMIN (GLUCOPHAGE) 1000 MG tablet Take 1 tablet (1,000 mg total) by mouth 2 (two) times daily with a meal.  . omega-3 acid ethyl esters (LOVAZA) 1 g capsule TAKE 2 CAPSULES BY MOUTH TWICE DAILY  . [DISCONTINUED] atorvastatin (LIPITOR) 10 MG tablet Take 1 tablet (10 mg total) by mouth daily.  . Suvorexant (BELSOMRA) 10 MG TABS Take 10 mg by mouth at bedtime.  . [DISCONTINUED] ranitidine (ZANTAC) 300 MG tablet Take 300 mg by mouth daily as needed for heartburn.  . [DISCONTINUED] traZODone (DESYREL) 50 MG tablet Take 0.5-1 tablets (25-50 mg total) by mouth at bedtime as needed for sleep.   No facility-administered encounter medications on  file as of 07/15/2018.     Allergies: Cetirizine  Body mass index is 44.2 kg/m.  Blood pressure 126/88, pulse 93, temperature 98.6 F (37 C), temperature source Oral, height 6' 0.25" (1.835 m), weight (!) 328 lb 3.2 oz (148.9 kg), SpO2 96 %.  Review of Systems  Constitutional: Positive for fatigue. Negative for activity change, appetite change, chills, diaphoresis, fever and unexpected weight change.  Eyes: Negative for visual disturbance.  Respiratory: Negative for cough, chest tightness, shortness of breath and wheezing.   Cardiovascular: Negative for chest pain, palpitations and leg swelling.  Gastrointestinal: Negative for abdominal distention, abdominal pain, blood in stool, constipation, diarrhea, nausea and vomiting.  Endocrine: Negative for cold intolerance, heat intolerance, polydipsia, polyphagia and polyuria.  Genitourinary: Negative for difficulty urinating and flank pain.  Musculoskeletal: Positive for back pain.  Skin: Negative for color change, pallor, rash and wound.  Allergic/Immunologic: Negative for immunocompromised state.  Neurological: Negative for dizziness and headaches.  Hematological: Does not bruise/bleed easily.  Psychiatric/Behavioral: Negative  for sleep disturbance.       Objective:   Physical Exam  Constitutional: He is oriented to person, place, and time. He appears well-developed and well-nourished. No distress.  HENT:  Head: Normocephalic and atraumatic.  Right Ear: External ear normal.  Left Ear: External ear normal.  Eyes: Pupils are equal, round, and reactive to light. Conjunctivae are normal.  Cardiovascular: Normal rate, regular rhythm, normal heart sounds and intact distal pulses.  No murmur heard. Pulmonary/Chest: Effort normal and breath sounds normal. No respiratory distress. He has no wheezes. He has no rales. He exhibits no tenderness.  Musculoskeletal: Normal range of motion.        General: Tenderness present.     Lumbar back: He  exhibits tenderness and pain. He exhibits normal range of motion.  Neurological: He is alert and oriented to person, place, and time. Coordination normal.  Skin: Skin is warm and dry. No rash noted. He is not diaphoretic. No erythema. No pallor.  Psychiatric: He has a normal mood and affect. His behavior is normal. Judgment and thought content normal.  Nursing note and vitals reviewed.     Assessment & Plan:   1. Diabetes mellitus without complication ([place])   2. Hyperlipidemia associated with type 2 diabetes mellitus ([place])   3. Benign essential hypertension   4. Insomnia, unspecified type   5. BMI 40.0-44.9, adult ([place])   6. Morbid obesity ([place])     Hyperlipidemia associated with type 2 diabetes mellitus ([place]) Lab Results  Component Value Date   HGBA1C 7.4 (A) 07/15/2018   HGBA1C 7.3 (H) 04/08/2018   HGBA1C 8.0 (A) 11/29/2017   AM BS 140-160s, denies episodes of hypoglycemia Continue Metformin 1040m BID Resume regular exercise Follow diabetic diet Continue to abstain from ETOH  Benign essential hypertension BP at goal 126/88, HR 93 Continue Lisinopril 456mQD, Amlodipine 64m22mD Continue to abstain from tobacco/ETOH use  [place]- no aberrancies noted  Previously tried/failed Lorazepam Trazodone Sent ion   Morbid obesity ([place] mass index is 44.2 kg/m.  Current wt 328 Lost 5 lbs since last OV 4 months ago Follow diabetic diet, resume regular exercise- elliptical     FOLLOW-UP:  Return in about 3 months (around 10/14/2018) for Fasting Labs, CPE.

## 2018-07-15 ENCOUNTER — Encounter: Payer: Self-pay | Admitting: Adult Health

## 2018-07-15 ENCOUNTER — Ambulatory Visit (INDEPENDENT_AMBULATORY_CARE_PROVIDER_SITE_OTHER): Payer: BLUE CROSS/BLUE SHIELD | Admitting: Adult Health

## 2018-07-15 VITALS — BP 126/88 | HR 93 | Temp 98.6°F | Ht 72.25 in | Wt 328.2 lb

## 2018-07-15 DIAGNOSIS — I10 Essential (primary) hypertension: Secondary | ICD-10-CM | POA: Diagnosis not present

## 2018-07-15 DIAGNOSIS — Z6841 Body Mass Index (BMI) 40.0 and over, adult: Secondary | ICD-10-CM | POA: Insufficient documentation

## 2018-07-15 DIAGNOSIS — E119 Type 2 diabetes mellitus without complications: Secondary | ICD-10-CM | POA: Diagnosis not present

## 2018-07-15 DIAGNOSIS — G4700 Insomnia, unspecified: Secondary | ICD-10-CM

## 2018-07-15 DIAGNOSIS — E1169 Type 2 diabetes mellitus with other specified complication: Secondary | ICD-10-CM | POA: Diagnosis not present

## 2018-07-15 DIAGNOSIS — E785 Hyperlipidemia, unspecified: Secondary | ICD-10-CM

## 2018-07-15 LAB — POCT GLYCOSYLATED HEMOGLOBIN (HGB A1C): Hemoglobin A1C: 7.4 % — AB (ref 4.0–5.6)

## 2018-07-15 MED ORDER — SUVOREXANT 10 MG PO TABS: 10.0000 mg | ORAL_TABLET | Freq: Every day | ORAL | 0 refills | Status: DC

## 2018-07-15 MED ORDER — ATORVASTATIN CALCIUM 10 MG PO TABS: ORAL_TABLET | ORAL | 3 refills | Status: DC

## 2018-07-15 NOTE — Assessment & Plan Note (Signed)
BP at goal 126/88, HR 93 Continue Lisinopril 40mg  QD, Amlodipine 5mg  QD Continue to abstain from tobacco/ETOH use

## 2018-07-15 NOTE — Assessment & Plan Note (Signed)
>>  ASSESSMENT AND PLAN FOR BENIGN ESSENTIAL HYPERTENSION WRITTEN ON 07/15/2018 10:57 AM BY DANFORD, KATY D, NP  BP at goal 126/88, HR 93 Continue Lisinopril 40mg  QD, Amlodipine 5mg  QD Continue to abstain from tobacco/ETOH use

## 2018-07-15 NOTE — Assessment & Plan Note (Signed)
Lab Results  Component Value Date   HGBA1C 7.4 (A) 07/15/2018   HGBA1C 7.3 (H) 04/08/2018   HGBA1C 8.0 (A) 11/29/2017   AM BS 140-160s, denies episodes of hypoglycemia Continue Metformin 1000mg  BID Resume regular exercise Follow diabetic diet Continue to abstain from ETOH

## 2018-07-15 NOTE — Assessment & Plan Note (Signed)
>>  ASSESSMENT AND PLAN FOR HYPERLIPIDEMIA ASSOCIATED WITH TYPE 2 DIABETES MELLITUS (HCC) WRITTEN ON 07/15/2018 10:56 AM BY DANFORD, Orpha Bur D, NP  Lab Results  Component Value Date   HGBA1C 7.4 (A) 07/15/2018   HGBA1C 7.3 (H) 04/08/2018   HGBA1C 8.0 (A) 11/29/2017   AM BS 140-160s, denies episodes of hypoglycemia Continue Metformin 1000mg  BID Resume regular exercise Follow diabetic diet Continue to abstain from ETOH

## 2018-07-15 NOTE — Assessment & Plan Note (Signed)
Body mass index is 44.2 kg/m.  Current wt 328 Lost 5 lbs since last OV 4 months ago Follow diabetic diet, resume regular exercise- elliptical

## 2018-07-15 NOTE — Assessment & Plan Note (Addendum)
[place] Controlled Substance Database reviewed- no aberrancies noted  Previously tried/failed Lorazepam Trazodone Sent ion

## 2018-07-15 NOTE — Patient Instructions (Addendum)
Diabetes Mellitus and Nutrition, Adult When you have diabetes (diabetes mellitus), it is very important to have healthy eating habits because your blood sugar (glucose) levels are greatly affected by what you eat and drink. Eating healthy foods in the appropriate amounts, at about the same times every day, can help you:  Control your blood glucose.  Lower your risk of heart disease.  Improve your blood pressure.  Reach or maintain a healthy weight. Every person with diabetes is different, and each person has different needs for a meal plan. Your health care provider may recommend that you work with a diet and nutrition specialist (dietitian) to make a meal plan that is best for you. Your meal plan may vary depending on factors such as:  The calories you need.  The medicines you take.  Your weight.  Your blood glucose, blood pressure, and cholesterol levels.  Your activity level.  Other health conditions you have, such as heart or kidney disease. How do carbohydrates affect me? Carbohydrates, also called carbs, affect your blood glucose level more than any other type of food. Eating carbs naturally raises the amount of glucose in your blood. Carb counting is a method for keeping track of how many carbs you eat. Counting carbs is important to keep your blood glucose at a healthy level, especially if you use insulin or take certain oral diabetes medicines. It is important to know how many carbs you can safely have in each meal. This is different for every person. Your dietitian can help you calculate how many carbs you should have at each meal and for each snack. Foods that contain carbs include:  Bread, cereal, rice, pasta, and crackers.  Potatoes and corn.  Peas, beans, and lentils.  Milk and yogurt.  Fruit and juice.  Desserts, such as cakes, cookies, ice cream, and candy. How does alcohol affect me? Alcohol can cause a sudden decrease in blood glucose (hypoglycemia),  especially if you use insulin or take certain oral diabetes medicines. Hypoglycemia can be a life-threatening condition. Symptoms of hypoglycemia (sleepiness, dizziness, and confusion) are similar to symptoms of having too much alcohol. If your health care provider says that alcohol is safe for you, follow these guidelines:  Limit alcohol intake to no more than 1 drink per day for nonpregnant women and 2 drinks per day for men. One drink equals 12 oz of beer, 5 oz of wine, or 1 oz of hard liquor.  Do not drink on an empty stomach.  Keep yourself hydrated with water, diet soda, or unsweetened iced tea.  Keep in mind that regular soda, juice, and other mixers may contain a lot of sugar and must be counted as carbs. What are tips for following this plan?  Reading food labels  Start by checking the serving size on the "Nutrition Facts" label of packaged foods and drinks. The amount of calories, carbs, fats, and other nutrients listed on the label is based on one serving of the item. Many items contain more than one serving per package.  Check the total grams (g) of carbs in one serving. You can calculate the number of servings of carbs in one serving by dividing the total carbs by 15. For example, if a food has 30 g of total carbs, it would be equal to 2 servings of carbs.  Check the number of grams (g) of saturated and trans fats in one serving. Choose foods that have low or no amount of these fats.  Check the number of   milligrams (mg) of salt (sodium) in one serving. Most people should limit total sodium intake to less than 2,300 mg per day.  Always check the nutrition information of foods labeled as "low-fat" or "nonfat". These foods may be higher in added sugar or refined carbs and should be avoided.  Talk to your dietitian to identify your daily goals for nutrients listed on the label. Shopping  Avoid buying canned, premade, or processed foods. These foods tend to be high in fat, sodium,  and added sugar.  Shop around the outside edge of the grocery store. This includes fresh fruits and vegetables, bulk grains, fresh meats, and fresh dairy. Cooking  Use low-heat cooking methods, such as baking, instead of high-heat cooking methods like deep frying.  Cook using healthy oils, such as olive, canola, or sunflower oil.  Avoid cooking with butter, cream, or high-fat meats. Meal planning  Eat meals and snacks regularly, preferably at the same times every day. Avoid going long periods of time without eating.  Eat foods high in fiber, such as fresh fruits, vegetables, beans, and whole grains. Talk to your dietitian about how many servings of carbs you can eat at each meal.  Eat 4-6 ounces (oz) of lean protein each day, such as lean meat, chicken, fish, eggs, or tofu. One oz of lean protein is equal to: ? 1 oz of meat, chicken, or fish. ? 1 egg. ?  cup of tofu.  Eat some foods each day that contain healthy fats, such as avocado, nuts, seeds, and fish. Lifestyle  Check your blood glucose regularly.  Exercise regularly as told by your health care provider. This may include: ? 150 minutes of moderate-intensity or vigorous-intensity exercise each week. This could be brisk walking, biking, or water aerobics. ? Stretching and doing strength exercises, such as yoga or weightlifting, at least 2 times a week.  Take medicines as told by your health care provider.  Do not use any products that contain nicotine or tobacco, such as cigarettes and e-cigarettes. If you need help quitting, ask your health care provider.  Work with a Social worker or diabetes educator to identify strategies to manage stress and any emotional and social challenges. Questions to ask a health care provider  Do I need to meet with a diabetes educator?  Do I need to meet with a dietitian?  What number can I call if I have questions?  When are the best times to check my blood glucose? Where to find more  information:  American Diabetes Association: diabetes.org  Academy of Nutrition and Dietetics: www.eatright.[employer] of Diabetes and Digestive and Kidney Diseases (NIH): [employer] Summary  A healthy meal plan will help you control your blood glucose and maintain a healthy lifestyle.  Working with a diet and nutrition specialist (dietitian) can help you make a meal plan that is best for you.  Keep in mind that carbohydrates (carbs) and alcohol have immediate effects on your blood glucose levels. It is important to count carbs and to use alcohol carefully. This information is not intended to replace advice given to you by your health care provider. Make sure you discuss any questions you have with your health care provider. Document Released: 03/23/2005 Document Revised: 01/24/2017 Document Reviewed: 07/31/2016 Elsevier Interactive Patient Education  2019 [employer].  Continue to follow diabetic diet. Continue all medications as directed, with the following changes- Reduce nightly atorvastatin to 10mg  1/2 tablet. Start once nightly Belsomra 10mg  as needed for insomnia- prescription sent in, Prior  Authorization may be required. Continue to drink plenty of water. Continue to abstain from tobacco/alcohol use. Resume regular exercise. Recommend complete physical with fasting labs in 3 months. Great job on the weight loss, blood pressure, and stable A1c! GREAT TO SEE YOU!

## 2018-07-19 ENCOUNTER — Telehealth: Payer: Self-pay | Admitting: Adult Health

## 2018-07-19 NOTE — Telephone Encounter (Signed)
[employer] is requesting a call back on patient's PA for Belsomra, they can be reached at [phone] and uses reference number [id number]

## 2018-07-21 ENCOUNTER — Other Ambulatory Visit: Payer: Self-pay | Admitting: Adult Health

## 2018-07-23 NOTE — Telephone Encounter (Signed)
PA request form faxed to [place] by Annabell Sabal.  [name], CMA

## 2018-07-26 ENCOUNTER — Telehealth: Payer: Self-pay

## 2018-07-26 ENCOUNTER — Other Ambulatory Visit: Payer: Self-pay | Admitting: Adult Health

## 2018-07-26 NOTE — Telephone Encounter (Signed)
No We have tried Lorazepam and Trazodone Thanks! Eddie Hancock

## 2018-07-26 NOTE — Telephone Encounter (Signed)
Pt's insurance company denied PA for [employer].  Would you like to RX the patient an alternative?  [name], CMA

## 2018-07-29 NOTE — Telephone Encounter (Signed)
LVM for pt to call to discuss.  T. Hazeline Charnley, CMA  

## 2018-07-30 NOTE — Telephone Encounter (Signed)
Pt informed.  Pt expressed understanding.  T. Oakley Kossman, CMA 

## 2018-08-14 ENCOUNTER — Other Ambulatory Visit: Payer: Self-pay | Admitting: Adult Health

## 2018-08-14 DIAGNOSIS — I10 Essential (primary) hypertension: Secondary | ICD-10-CM

## 2018-09-19 ENCOUNTER — Other Ambulatory Visit: Payer: Self-pay | Admitting: Adult Health

## 2018-09-20 NOTE — Telephone Encounter (Signed)
I am unsure if pt was to continue this medication indefinitely.  Please review and refill if appropriate.  [name], CMA

## 2018-10-20 ENCOUNTER — Other Ambulatory Visit: Payer: Self-pay | Admitting: Adult Health

## 2018-10-28 ENCOUNTER — Ambulatory Visit (INDEPENDENT_AMBULATORY_CARE_PROVIDER_SITE_OTHER): Payer: BLUE CROSS/BLUE SHIELD | Admitting: Adult Health

## 2018-10-28 ENCOUNTER — Other Ambulatory Visit: Payer: Self-pay

## 2018-10-28 ENCOUNTER — Encounter: Payer: Self-pay | Admitting: Adult Health

## 2018-10-28 DIAGNOSIS — Z0000 Encounter for general adult medical examination without abnormal findings: Secondary | ICD-10-CM

## 2018-10-28 DIAGNOSIS — E119 Type 2 diabetes mellitus without complications: Secondary | ICD-10-CM

## 2018-10-28 NOTE — Progress Notes (Signed)
Virtual Visit via Telephone Note  I connected with Eddie Hancock on 10/28/18 at  8:15 AM EDT by telephone and verified that I am speaking with the correct person using two identifiers.   I discussed the limitations, risks, security and privacy concerns of performing an evaluation and management service by telephone and the availability of in person appointments. The staff discussed with the Eddie Hancock that there may be a Eddie Hancock responsible charge related to this service. The Eddie Hancock expressed understanding and agreed to proceed.   History of Present Illness: 07/15/18 OV: Eddie Hancock presents for Hancock: HTN, T2D, HLD, Obesity He reports AM BS 140-160s HE denies episodes of hypoglycemia Currently only on metformin 1061m BIG Plans on resuming regular exercise- 5 days/week since his wife earned her drivers license he states "I have a lot more time now" He has stopped all tobacco/ETOH use He has lost 5 lbs since last OV in August He reports insomnia even with Trazodone He was previously tried on lorazepam  Discussed trying on new to [employer] may require PA Foot Exam-normal 10/28/2018 OV: Eddie Hancock: HTN, Obesity, T2D, Insomnia He reports AM BG 150-210s, he states "readings are primarily in 150-160 range, the higher one are after I eat to late in the day" He denies episodes of hypoglycemia He reports very little physical activity due home isolation He is unable to check BP His HR 70s- checked via smart watch He denies acute cardiac sx's He continues to drink >100 oz water/day He reports increase in regular ETOH use, again due to home isolation- will drink 4 vodka cocktails/day 2-3 times/week He has taken voluntary leave of absence from work die to COVID-19 pandemic- will resume regular work schedule in 3 weeks He denies increase in anxiety/depression Reports improved sleep  Eddie Hancock Care Team    Relationship Specialty Notifications Start End  Hancock,  KBerna Spare NP PCP - General Family Medicine  10/17/16     Eddie Hancock Active Problem List   Diagnosis Date Noted  . BMI 40.0-44.9, adult ([facility] 07/15/2018  . Morbid obesity ([facility] 07/15/2018  . Insomnia 11/29/2017  . Diabetes mellitus without complication ([facility] 178/67/6720 . Hyperlipidemia associated with type 2 diabetes mellitus ([facility] 06/21/2017  . Healthcare maintenance 06/21/2017  . Contact dermatitis 10/17/2016  . Benign essential hypertension 11/04/2015  . Chronic low back pain without sciatica 11/04/2015  . Hyperglycemia 11/04/2015  . Hypertriglyceridemia 11/04/2015  . Seasonal allergic rhinitis due to pollen 11/04/2015     Past Medical History:  Diagnosis Date  . Diabetes mellitus without complication ([facility] [place]   . Hypertension      Past Surgical History:  Procedure Laterality Date  . APPENDECTOMY       Family History  Problem Relation Age of Onset  . Hypertension Mother   . Diabetes Daughter   . Learning disabilities Maternal Grandmother   . Learning disabilities Maternal Grandfather      Social History   Substance and Sexual Activity  Drug Use No     Social History   Substance and Sexual Activity  Alcohol Use No     Social History   Tobacco Use  Smoking Status Former Smoker  . Last attempt to quit: 11/07/2016  . Years since quitting: 1.9  Smokeless Tobacco Never Used     Outpatient Encounter Medications as of 10/28/2018  Medication Sig  . allopurinol (ZYLOPRIM) 300 MG tablet TAKE 1 TABLET(300 MG) BY MOUTH DAILY  . amLODipine (NORVASC) 5 MG  tablet TAKE 1 TABLET(5 MG) BY MOUTH DAILY  . atorvastatin (LIPITOR) 10 MG tablet Take 1/2 tablet nightly  . Blood Glucose Monitoring Suppl (CONTOUR NEXT ONE) KIT 1 each by Does not apply route 2 (two) times daily. Check fasting blood sugar in morning and then recheck blood sugar after largest meal.  . cyclobenzaprine (FLEXERIL) 10 MG tablet TAKE 1 TABLET(10 MG) BY MOUTH THREE TIMES DAILY AS NEEDED FOR MUSCLE SPASMS   . ezetimibe (ZETIA) 10 MG tablet TAKE 1 TABLET(10 MG) BY MOUTH DAILY  . famotidine (PEPCID) 20 MG tablet Take 20 mg by mouth daily.  . glucose blood test strip Use to check blood sugars three times daily  . lisinopril (PRINIVIL,ZESTRIL) 40 MG tablet TAKE 1 TABLET(40 MG) BY MOUTH DAILY  . meloxicam (MOBIC) 7.5 MG tablet Take 1 tablet (7.5 mg total) by mouth 2 (two) times daily with a meal.  . omega-3 acid ethyl esters (LOVAZA) 1 g capsule TAKE 2 CAPSULES BY MOUTH TWICE DAILY  . metFORMIN (GLUCOPHAGE) 1000 MG tablet TAKE 1 TABLET(1000 MG) BY MOUTH TWICE DAILY WITH A MEAL  . [DISCONTINUED] Suvorexant (BELSOMRA) 10 MG TABS Take 10 mg by mouth at bedtime.   No facility-administered encounter medications on file as of 10/28/2018.     Allergies: Cetirizine  Body mass index is 44.66 kg/m.  Pulse 76, temperature 98.1 F (36.7 C), temperature source Oral, height 6' 0.25" (1.835 m), weight (!) 331 lb 9.6 oz (150.4 kg).    Observations/Objective: No acute distress over the telpehone  Assessment and Plan: Continue all medications as directed Continue to remain well hydrated Follow diabetic diet Increase regular exercise.  Recommend at least 30 minutes daily, 5 days per week of walking, jogging, biking, swimming, YouTube/Pinterest workout videos. Decrease ETOH use- max 2 cocktails/day  Follow Up Instructions: 1 week lab appt for A1c OV in 3 months   I discussed the assessment and treatment plan with the Eddie Hancock. The Eddie Hancock was provided an opportunity to ask questions and all were answered. The Eddie Hancock agreed with the plan and demonstrated an understanding of the instructions.   The Eddie Hancock was advised to call back or seek an in-person evaluation if the symptoms worsen or if the condition fails to improve as anticipated.  I provided 15 minutes of non-face-to-face time during this encounter.   Eddie D Danford, NP  

## 2018-10-28 NOTE — Assessment & Plan Note (Signed)
Assessment and Plan: Continue all medications as directed Continue to remain well hydrated Follow diabetic diet Increase regular exercise.  Recommend at least 30 minutes daily, 5 days per week of walking, jogging, biking, swimming, YouTube/Pinterest workout videos. Decrease ETOH use- max 2 cocktails/day  Follow Up Instructions: 1 week lab appt for A1c OV in 3 months   I discussed the assessment and treatment plan with the patient. The patient was provided an opportunity to ask questions and all were answered. The patient agreed with the plan and demonstrated an understanding of the instructions.   The patient was advised to call back or seek an in-person evaluation if the symptoms worsen or if the condition fails to improve as anticipated.

## 2018-10-28 NOTE — Assessment & Plan Note (Signed)
Lab Results  Component Value Date   HGBA1C 7.4 (A) 07/15/2018   HGBA1C 7.3 (H) 04/08/2018   HGBA1C 8.0 (A) 11/29/2017  Needs A1c next week AM BG 150-210s He denies episodes of hypoglymcemia Advised to follow diabetic diet and increase regular exercise Reduce ETOH use, max 2 cocktails/day

## 2018-11-04 ENCOUNTER — Other Ambulatory Visit: Payer: BLUE CROSS/BLUE SHIELD

## 2018-11-08 ENCOUNTER — Other Ambulatory Visit: Payer: Self-pay | Admitting: Adult Health

## 2018-11-08 DIAGNOSIS — I10 Essential (primary) hypertension: Secondary | ICD-10-CM

## 2018-11-08 NOTE — Telephone Encounter (Signed)
Patient was supposed to follow-up in 3 months when he last saw Katy back in January.  He was told to come in for fasting blood work and CPE so, she may want to still get the full set of fasting blood work that she noted in her last OV note. Please call and notify pt that we gave him #30 tabs on his meds so he can have enough until he makes another appt with her in the near future. thnx

## 2018-11-11 ENCOUNTER — Other Ambulatory Visit: Payer: Self-pay

## 2018-11-11 ENCOUNTER — Other Ambulatory Visit (INDEPENDENT_AMBULATORY_CARE_PROVIDER_SITE_OTHER): Payer: BLUE CROSS/BLUE SHIELD

## 2018-11-11 DIAGNOSIS — I10 Essential (primary) hypertension: Secondary | ICD-10-CM

## 2018-11-11 DIAGNOSIS — R739 Hyperglycemia, unspecified: Secondary | ICD-10-CM

## 2018-11-11 DIAGNOSIS — E1169 Type 2 diabetes mellitus with other specified complication: Secondary | ICD-10-CM

## 2018-11-11 DIAGNOSIS — Z0000 Encounter for general adult medical examination without abnormal findings: Secondary | ICD-10-CM

## 2018-11-11 DIAGNOSIS — E785 Hyperlipidemia, unspecified: Secondary | ICD-10-CM

## 2018-11-11 DIAGNOSIS — E119 Type 2 diabetes mellitus without complications: Secondary | ICD-10-CM

## 2018-11-11 DIAGNOSIS — E781 Pure hyperglyceridemia: Secondary | ICD-10-CM

## 2018-11-12 ENCOUNTER — Other Ambulatory Visit: Payer: Self-pay | Admitting: Adult Health

## 2018-11-12 LAB — COMPREHENSIVE METABOLIC PANEL
ALT: 115 IU/L — ABNORMAL HIGH (ref 0–44)
AST: 62 IU/L — ABNORMAL HIGH (ref 0–40)
Albumin/Globulin Ratio: 2.5 — ABNORMAL HIGH (ref 1.2–2.2)
Albumin: 4.5 g/dL (ref 4.0–5.0)
Alkaline Phosphatase: 54 IU/L (ref 39–117)
BUN/Creatinine Ratio: 13 (ref 9–20)
BUN: 12 mg/dL (ref 6–24)
Bilirubin Total: 0.7 mg/dL (ref 0.0–1.2)
CO2: 21 mmol/L (ref 20–29)
Calcium: 9.6 mg/dL (ref 8.7–10.2)
Chloride: 105 mmol/L (ref 96–106)
Creatinine, Ser: 0.96 mg/dL (ref 0.76–1.27)
GFR calc Af Amer: 108 mL/min/{1.73_m2} (ref >59)
GFR calc non Af Amer: 94 mL/min/{1.73_m2} (ref >59)
Globulin, Total: 1.8 g/dL (ref 1.5–4.5)
Glucose: 173 mg/dL — ABNORMAL HIGH (ref 65–99)
Potassium: 5.1 mmol/L (ref 3.5–5.2)
Sodium: 142 mmol/L (ref 134–144)
Total Protein: 6.3 g/dL (ref 6.0–8.5)

## 2018-11-12 LAB — CBC WITH DIFFERENTIAL/PLATELET
Basophils Absolute: 0.1 10*3/uL (ref 0.0–0.2)
Basos: 1 %
EOS (ABSOLUTE): 0.5 10*3/uL — ABNORMAL HIGH (ref 0.0–0.4)
Eos: 6 %
Hematocrit: 44.8 % (ref 37.5–51.0)
Hemoglobin: 15.6 g/dL (ref 13.0–17.7)
Immature Grans (Abs): 0 10*3/uL (ref 0.0–0.1)
Immature Granulocytes: 1 %
Lymphocytes Absolute: 3.2 10*3/uL — ABNORMAL HIGH (ref 0.7–3.1)
Lymphs: 40 %
MCH: 30.8 pg (ref 26.6–33.0)
MCHC: 34.8 g/dL (ref 31.5–35.7)
MCV: 89 fL (ref 79–97)
Monocytes Absolute: 0.7 10*3/uL (ref 0.1–0.9)
Monocytes: 9 %
Neutrophils Absolute: 3.5 10*3/uL (ref 1.4–7.0)
Neutrophils: 43 %
Platelets: 240 10*3/uL (ref 150–450)
RBC: 5.06 x10E6/uL (ref 4.14–5.80)
RDW: 13.5 % (ref 11.6–15.4)
WBC: 8 10*3/uL (ref 3.4–10.8)

## 2018-11-12 LAB — LIPID PANEL
Chol/HDL Ratio: 2.6 ratio (ref 0.0–5.0)
Cholesterol, Total: 97 mg/dL — ABNORMAL LOW (ref 100–199)
HDL: 37 mg/dL — ABNORMAL LOW (ref >39)
LDL Calculated: 29 mg/dL (ref 0–99)
Triglycerides: 155 mg/dL — ABNORMAL HIGH (ref 0–149)
VLDL Cholesterol Cal: 31 mg/dL (ref 5–40)

## 2018-11-12 LAB — TSH: TSH: 2.59 u[IU]/mL (ref 0.450–4.500)

## 2018-11-12 LAB — HEMOGLOBIN A1C
Est. average glucose Bld gHb Est-mCnc: 174 mg/dL
Hgb A1c MFr Bld: 7.7 % — ABNORMAL HIGH (ref 4.8–5.6)

## 2018-11-12 MED ORDER — METFORMIN HCL 1000 MG PO TABS: 1000.0000 mg | ORAL_TABLET | Freq: Two times a day (BID) | ORAL | 2 refills | Status: DC

## 2018-12-03 ENCOUNTER — Other Ambulatory Visit: Payer: Self-pay | Admitting: Family Medicine

## 2018-12-03 DIAGNOSIS — I10 Essential (primary) hypertension: Secondary | ICD-10-CM

## 2018-12-12 ENCOUNTER — Other Ambulatory Visit: Payer: Self-pay | Admitting: Adult Health

## 2018-12-16 NOTE — Telephone Encounter (Signed)
Good Morning Tonya, Can you please call Mr. Dominica Severin and inquire why he needs the refill? Thanks! Valetta Fuller

## 2018-12-16 NOTE — Telephone Encounter (Signed)
We have not prescribed this medication since 06/2017.  Please review for appropriateness of refill and authorize if appropriate.  [name], CMA

## 2018-12-17 NOTE — Telephone Encounter (Signed)
LVM for pt to call to discuss.  T. Maksym Pfiffner, CMA  

## 2018-12-18 NOTE — Telephone Encounter (Signed)
LVM for pt to return call.  T. Nelson, CMA 

## 2018-12-23 ENCOUNTER — Other Ambulatory Visit: Payer: Self-pay

## 2018-12-23 MED ORDER — MELOXICAM 7.5 MG PO TABS: 7.5000 mg | ORAL_TABLET | Freq: Two times a day (BID) | ORAL | 1 refills | Status: DC

## 2018-12-23 NOTE — Telephone Encounter (Signed)
Pt states that you had previously prescribed this medication for his chronic back pain since he is unable to take his muscle relaxer when working.  Please review and refill if appropriate.  [name], CMA

## 2019-01-14 ENCOUNTER — Other Ambulatory Visit: Payer: Self-pay | Admitting: Adult Health

## 2019-01-27 ENCOUNTER — Ambulatory Visit: Payer: BLUE CROSS/BLUE SHIELD | Admitting: Adult Health

## 2019-02-26 ENCOUNTER — Other Ambulatory Visit: Payer: Self-pay | Admitting: Adult Health

## 2019-02-26 DIAGNOSIS — I10 Essential (primary) hypertension: Secondary | ICD-10-CM

## 2019-03-26 ENCOUNTER — Other Ambulatory Visit: Payer: Self-pay | Admitting: Adult Health

## 2019-03-26 DIAGNOSIS — I10 Essential (primary) hypertension: Secondary | ICD-10-CM

## 2019-04-07 ENCOUNTER — Other Ambulatory Visit: Payer: Self-pay | Admitting: Adult Health

## 2019-04-13 ENCOUNTER — Other Ambulatory Visit: Payer: Self-pay | Admitting: Adult Health

## 2019-04-14 ENCOUNTER — Other Ambulatory Visit: Payer: Self-pay | Admitting: Adult Health

## 2019-04-18 NOTE — Progress Notes (Signed)
Virtual Visit via Telephone Note  I connected with Eddie Hancock on 04/21/19 at  3:15 PM EDT by telephone and verified that I am speaking with the correct person using two identifiers.  Location: Patient: [facility]   I discussed the limitations, risks, security and privacy concerns of performing an evaluation and management service by telephone and the availability of in person appointments. I also discussed with the patient that there may be a patient responsible charge related to this service. The patient expressed understanding and agreed to proceed.   History of Present Illness: 10/28/2018 OV: Eddie Hancock calls in this morning for 3 month f/u: HTN, Obesity, T2D, Insomnia He reports AM BG 150-210s, he states "readings are primarily in 150-160 range, the higher one are after I eat too late in the day" He denies episodes of hypoglycemia He reports very little physical activity due home isolation He is unable to check BP His HR 70s- checked via smart watch He denies acute cardiac sx's He continues to drink >100 oz water/day He reports increase in regular ETOH use, again due to home isolation- will drink 4 vodka cocktails/day 2-3 times/week He has taken voluntary leave of absence from work due to COVID-19 pandemic- will resume regular work schedule in 3 weeks He denies increase in anxiety/depression Reports improved sleep 04/21/2019 OV: Eddie Hancock calls in for regular f/u: HTN, T2D, Obesity, HLD- only on Atorvastatin 24m 1/2 tab QD LDL May 2020- 29 He reports AM BG 180-200s He denies episodes of hypoglycemia   He is unable to check BP/HR at home, denies acute cardiac issues. He has not had alcohol or used tobacco since January 11, 2019   Needs A1c, CMP    Lab Results  Component Value Date   HGBA1C 7.7 (H) 11/11/2018   HGBA1C 7.4 (A) 07/15/2018   HGBA1C 7.3 (H) 04/08/2018    Patient Care Team    Relationship Specialty Notifications Start End  DEsaw Grandchild NP PCP -  General Family Medicine  10/17/16     Patient Active Problem List   Diagnosis Date Noted  . BMI 40.0-44.9, adult ([facility] 07/15/2018  . Morbid obesity ([facility] 07/15/2018  . Insomnia 11/29/2017  . Diabetes mellitus without complication ([facility] 135/57/3220 . Hyperlipidemia associated with type 2 diabetes mellitus ([facility] 06/21/2017  . Healthcare maintenance 06/21/2017  . Contact dermatitis 10/17/2016  . Benign essential hypertension 11/04/2015  . Chronic low back pain without sciatica 11/04/2015  . Hyperglycemia 11/04/2015  . Hypertriglyceridemia 11/04/2015  . Seasonal allergic rhinitis due to pollen 11/04/2015     Past Medical History:  Diagnosis Date  . Diabetes mellitus without complication ([facility]   . Hypertension      Past Surgical History:  Procedure Laterality Date  . APPENDECTOMY       Family History  Problem Relation Age of Onset  . Hypertension Mother   . Diabetes Daughter   . Learning disabilities Maternal Grandmother   . Learning disabilities Maternal Grandfather      Social History   Substance and Sexual Activity  Drug Use No     Social History   Substance and Sexual Activity  Alcohol Use No     Social History   Tobacco Use  Smoking Status Former Smoker  . Quit date: 11/07/2016  . Years since quitting: 2.4  Smokeless Tobacco Never Used     Outpatient Encounter Medications as of 04/21/2019  Medication Sig  . allopurinol (ZYLOPRIM) 300 MG tablet TAKE 1 TABLET(300 MG) BY MOUTH  DAILY  . amLODipine (NORVASC) 5 MG tablet TAKE 1 TABLET(5 MG) BY MOUTH DAILY.  ABSOLUTELY NO FURTHER REFILLS UNTIL PATIENT IS SEEN  . atorvastatin (LIPITOR) 10 MG tablet Take 1/2 tablet nightly  . Blood Glucose Monitoring Suppl (CONTOUR NEXT ONE) KIT 1 each by Does not apply route 2 (two) times daily. Check fasting blood sugar in morning and then recheck blood sugar after largest meal.  . cyclobenzaprine (FLEXERIL) 10 MG tablet TAKE 1 TABLET(10 MG) BY MOUTH THREE TIMES DAILY AS  NEEDED FOR MUSCLE SPASMS  . ezetimibe (ZETIA) 10 MG tablet Take 1 tablet (10 mg total) by mouth daily. ABSOLUTELY NO FURTHER REFILLS UNTIL PATIENT IS SEEN  . famotidine (PEPCID) 20 MG tablet Take 20 mg by mouth daily.  Marland Kitchen glucose blood test strip Use to check blood sugars three times daily  . lisinopril (ZESTRIL) 40 MG tablet TAKE 1 TABLET(40 MG) BY MOUTH DAILY  . meloxicam (MOBIC) 7.5 MG tablet Take 1 tablet (7.5 mg total) by mouth 2 (two) times daily with a meal.  . metFORMIN (GLUCOPHAGE) 1000 MG tablet Take 1 tablet (1,000 mg total) by mouth 2 (two) times daily with a meal.  . omega-3 acid ethyl esters (LOVAZA) 1 g capsule Take 2 capsules (2 g total) by mouth 2 (two) times daily. ABSOLUTELY NO FURTHER REFILLS UNTIL PATIENT IS SEEN   No facility-administered encounter medications on file as of 04/21/2019.     Allergies: Cetirizine  There is no height or weight on file to calculate BMI.  There were no vitals taken for this visit. Review of Systems: General:   Denies fever, chills, unexplained weight loss.  Optho/Auditory:   Denies visual changes, blurred vision/LOV Respiratory:   Denies SOB, DOE more than baseline levels.  Cardiovascular:   Denies chest pain, palpitations, new onset peripheral edema  Gastrointestinal:   Denies nausea, vomiting, diarrhea.  Genitourinary: Denies dysuria, freq/ urgency, flank pain or discharge from genitals.  Endocrine:     Denies hot or cold intolerance, polyuria, polydipsia. Musculoskeletal:   Denies unexplained myalgias, joint swelling, unexplained arthralgias, gait problems.  Skin:  Denies rash, suspicious lesions Neurological:     Denies dizziness, unexplained weakness, numbness  Psychiatric/Behavioral:   Denies mood changes, suicidal or homicidal ideations, hallucinations This patient does not have sx concerning for COVID-19 Infection (ie; fever, chills, cough, new or worsening shortness of breath).   Observations/Objective: No acute distress  noted during the telephone conversation  Assessment and Plan: If A1c is well above 7 then recommend adding GLP-1RA to help control BG and help with wt loss Continue medications as directed. Remain well hydrated, follow diabetic diet. Increase daily walking. Remain off tobacco/ETOH use Only use Meloxicam sparingly Continue to social distance and wear a mask when in public.  Follow Up Instructions: Non-fasting labs in 1-2 weeks- A1c, CMP   I discussed the assessment and treatment plan with the patient. The patient was provided an opportunity to ask questions and all were answered. The patient agreed with the plan and demonstrated an understanding of the instructions.   The patient was advised to call back or seek an in-person evaluation if the symptoms worsen or if the condition fails to improve as anticipated.  I provided 8 minutes of non-face-to-face time during this encounter.   Esaw Grandchild, NP

## 2019-04-21 ENCOUNTER — Other Ambulatory Visit: Payer: Self-pay

## 2019-04-21 ENCOUNTER — Encounter: Payer: Self-pay | Admitting: Adult Health

## 2019-04-21 ENCOUNTER — Ambulatory Visit (INDEPENDENT_AMBULATORY_CARE_PROVIDER_SITE_OTHER): Payer: BC Managed Care – PPO | Admitting: Adult Health

## 2019-04-21 DIAGNOSIS — Z23 Encounter for immunization: Secondary | ICD-10-CM | POA: Diagnosis not present

## 2019-04-21 DIAGNOSIS — I10 Essential (primary) hypertension: Secondary | ICD-10-CM | POA: Diagnosis not present

## 2019-04-21 DIAGNOSIS — Z0000 Encounter for general adult medical examination without abnormal findings: Secondary | ICD-10-CM

## 2019-04-21 DIAGNOSIS — E1169 Type 2 diabetes mellitus with other specified complication: Secondary | ICD-10-CM

## 2019-04-21 DIAGNOSIS — Z79899 Other long term (current) drug therapy: Secondary | ICD-10-CM

## 2019-04-21 DIAGNOSIS — E119 Type 2 diabetes mellitus without complications: Secondary | ICD-10-CM

## 2019-04-21 DIAGNOSIS — E785 Hyperlipidemia, unspecified: Secondary | ICD-10-CM

## 2019-04-21 MED ORDER — MELOXICAM 7.5 MG PO TABS: 7.5000 mg | ORAL_TABLET | Freq: Two times a day (BID) | ORAL | 1 refills | Status: DC

## 2019-04-21 NOTE — Assessment & Plan Note (Signed)
Assessment and Plan: If A1c is well above 7 then recommend adding GLP-1RA to help control BG and help with wt loss Continue medications as directed. Remain well hydrated, follow diabetic diet. Increase daily walking. Remain off tobacco/ETOH use Only use Meloxicam sparingly Continue to social distance and wear a mask when in public.  Follow Up Instructions: Non-fasting labs in 1-2 weeks- A1c, CMP   I discussed the assessment and treatment plan with the patient. The patient was provided an opportunity to ask questions and all were answered. The patient agreed with the plan and demonstrated an understanding of the instructions.   The patient was advised to call back or seek an in-person evaluation if the symptoms worsen or if the condition fails to improve as anticipated.

## 2019-04-21 NOTE — Assessment & Plan Note (Signed)
>>  ASSESSMENT AND PLAN FOR HYPERLIPIDEMIA ASSOCIATED WITH TYPE 2 DIABETES MELLITUS ([facility]) WRITTEN ON 04/21/2019  4:07 PM BY DANFORD, KATY D, NP  Atorvastatin 10mg - 1/2 tab QD Denies myalgia's

## 2019-04-21 NOTE — Assessment & Plan Note (Signed)
Atorvastatin 10mg - 1/2 tab QD Denies myalgia's

## 2019-04-21 NOTE — Assessment & Plan Note (Signed)
Lisinopril 40mg  QD, Amlodipine 5mg  QD

## 2019-04-21 NOTE — Assessment & Plan Note (Signed)
Lab Results  Component Value Date   HGBA1C 7.7 (H) 11/11/2018   HGBA1C 7.4 (A) 07/15/2018   HGBA1C 7.3 (H) 04/08/2018  Currently on Metformin 1000mg  BID He reports AM BG 180-200s He denies episodes of hypoglycemia If A1c is well above 7 then recommend adding GLP-1RA to help control BG and help with wt loss Continue medications as directed. Remain well hydrated, follow diabetic diet.

## 2019-04-21 NOTE — Assessment & Plan Note (Signed)
>>  ASSESSMENT AND PLAN FOR BENIGN ESSENTIAL HYPERTENSION WRITTEN ON 04/21/2019  4:07 PM BY DANFORD, KATY D, NP  Lisinopril 40mg  QD, Amlodipine 5mg  QD

## 2019-04-28 ENCOUNTER — Other Ambulatory Visit: Payer: BC Managed Care – PPO

## 2019-04-28 ENCOUNTER — Other Ambulatory Visit: Payer: Self-pay

## 2019-04-28 ENCOUNTER — Other Ambulatory Visit: Payer: Self-pay | Admitting: Adult Health

## 2019-04-28 DIAGNOSIS — Z79899 Other long term (current) drug therapy: Secondary | ICD-10-CM | POA: Diagnosis not present

## 2019-04-28 DIAGNOSIS — I10 Essential (primary) hypertension: Secondary | ICD-10-CM

## 2019-04-28 DIAGNOSIS — E119 Type 2 diabetes mellitus without complications: Secondary | ICD-10-CM | POA: Diagnosis not present

## 2019-04-29 ENCOUNTER — Other Ambulatory Visit: Payer: Self-pay | Admitting: Adult Health

## 2019-04-29 LAB — COMPREHENSIVE METABOLIC PANEL
ALT: 49 IU/L — ABNORMAL HIGH (ref 0–44)
AST: 25 IU/L (ref 0–40)
Albumin/Globulin Ratio: 2.1 (ref 1.2–2.2)
Albumin: 4.5 g/dL (ref 4.0–5.0)
Alkaline Phosphatase: 61 IU/L (ref 39–117)
BUN/Creatinine Ratio: 13 (ref 9–20)
BUN: 14 mg/dL (ref 6–24)
Bilirubin Total: 1.4 mg/dL — ABNORMAL HIGH (ref 0.0–1.2)
CO2: 21 mmol/L (ref 20–29)
Calcium: 9.3 mg/dL (ref 8.7–10.2)
Chloride: 99 mmol/L (ref 96–106)
Creatinine, Ser: 1.07 mg/dL (ref 0.76–1.27)
GFR calc Af Amer: 95 mL/min/{1.73_m2} (ref >59)
GFR calc non Af Amer: 82 mL/min/{1.73_m2} (ref >59)
Globulin, Total: 2.1 g/dL (ref 1.5–4.5)
Glucose: 281 mg/dL — ABNORMAL HIGH (ref 65–99)
Potassium: 5.4 mmol/L — ABNORMAL HIGH (ref 3.5–5.2)
Sodium: 136 mmol/L (ref 134–144)
Total Protein: 6.6 g/dL (ref 6.0–8.5)

## 2019-04-29 LAB — HEMOGLOBIN A1C
Est. average glucose Bld gHb Est-mCnc: 209 mg/dL
Hgb A1c MFr Bld: 8.9 % — ABNORMAL HIGH (ref 4.8–5.6)

## 2019-04-29 MED ORDER — LIRAGLUTIDE 18 MG/3ML ~~LOC~~ SOPN: PEN_INJECTOR | SUBCUTANEOUS | 1 refills | Status: DC

## 2019-05-11 ENCOUNTER — Other Ambulatory Visit: Payer: Self-pay | Admitting: Adult Health

## 2019-05-29 ENCOUNTER — Other Ambulatory Visit: Payer: Self-pay | Admitting: Adult Health

## 2019-05-30 ENCOUNTER — Telehealth: Payer: Self-pay | Admitting: Adult Health

## 2019-05-30 NOTE — Telephone Encounter (Signed)
Pt's wife called states he got his Victoza Rx injectable but needles are missing.   --Forwarding request to med asst to order either 3/8 0r 1/2 needles.  Pt uses :   [facility] [id number], [place] [phone] (Phone) [phone] (Fax)

## 2019-06-10 ENCOUNTER — Telehealth: Payer: Self-pay | Admitting: Adult Health

## 2019-06-10 DIAGNOSIS — E119 Type 2 diabetes mellitus without complications: Secondary | ICD-10-CM

## 2019-06-10 MED ORDER — "PEN NEEDLES 5/16"" 31G X 8 MM MISC": 1.0000 | Freq: Every day | 0 refills | Status: DC

## 2019-06-10 NOTE — Addendum Note (Signed)
Addended by: Fonnie Mu on: 06/10/2019 02:10 PM   Modules accepted: Orders

## 2019-06-10 NOTE — Telephone Encounter (Signed)
Patient was given an insulin order but the pharm did not have an order for needles, which he needs. If approved please send to [facility] on [address]

## 2019-06-23 ENCOUNTER — Other Ambulatory Visit: Payer: Self-pay | Admitting: Adult Health

## 2019-07-17 ENCOUNTER — Telehealth: Payer: Self-pay | Admitting: Adult Health

## 2019-07-17 NOTE — Telephone Encounter (Signed)
Patient called and left VM is requesting a call back from clinic staff regarding some questions about his injectable meds. He did specify his question or concern on the message

## 2019-07-17 NOTE — Telephone Encounter (Signed)
Good Afternoon Tonya, Can you please call Mr. Nim and tell him he needs to be evaluated for MI/Acute Coronary Syndrome immediately- please proceed to nearest ED. He was started on Liraglutide November- GI sx's should have resolved after 2 months of therapy. Sincerely, Valetta Fuller

## 2019-07-17 NOTE — Telephone Encounter (Signed)
Pain under left ribs x 2 days, which  pt describes as a dull ache and nausea x 2 weeks.  Pt denies diarrhea, vomiting, fever, chills, diaphresis, as well as low blood sugar readings.  Pt wonders if this could be coming from the Victoza that he started approximately 6 weeks ago and if he should stop this medication.  Please advise.  [name], CMA

## 2019-07-17 NOTE — Telephone Encounter (Signed)
Pt informed.  Vehemently urged pt to be seen at ED STAT per [facility] recommendations since pt expressed hesitancy.  Pt expressed understanding, but continued to be hesitant about ED visit.  [name], CMA

## 2019-07-18 ENCOUNTER — Emergency Department (HOSPITAL_BASED_OUTPATIENT_CLINIC_OR_DEPARTMENT_OTHER): Payer: BC Managed Care – PPO

## 2019-07-18 ENCOUNTER — Other Ambulatory Visit: Payer: Self-pay

## 2019-07-18 ENCOUNTER — Emergency Department (HOSPITAL_BASED_OUTPATIENT_CLINIC_OR_DEPARTMENT_OTHER)
Admission: EM | Admit: 2019-07-18 | Discharge: 2019-07-18 | Disposition: A | Discharge status: Home / Self Care | Payer: BC Managed Care – PPO | Attending: Emergency Medicine | Admitting: Emergency Medicine

## 2019-07-18 ENCOUNTER — Encounter (HOSPITAL_BASED_OUTPATIENT_CLINIC_OR_DEPARTMENT_OTHER): Payer: Self-pay | Admitting: Emergency Medicine

## 2019-07-18 DIAGNOSIS — R1011 Right upper quadrant pain: Secondary | ICD-10-CM | POA: Diagnosis not present

## 2019-07-18 DIAGNOSIS — R1012 Left upper quadrant pain: Secondary | ICD-10-CM | POA: Diagnosis not present

## 2019-07-18 DIAGNOSIS — I10 Essential (primary) hypertension: Secondary | ICD-10-CM | POA: Insufficient documentation

## 2019-07-18 DIAGNOSIS — E119 Type 2 diabetes mellitus without complications: Secondary | ICD-10-CM | POA: Diagnosis not present

## 2019-07-18 DIAGNOSIS — Z79899 Other long term (current) drug therapy: Secondary | ICD-10-CM | POA: Insufficient documentation

## 2019-07-18 DIAGNOSIS — K219 Gastro-esophageal reflux disease without esophagitis: Secondary | ICD-10-CM | POA: Insufficient documentation

## 2019-07-18 DIAGNOSIS — Z794 Long term (current) use of insulin: Secondary | ICD-10-CM | POA: Diagnosis not present

## 2019-07-18 DIAGNOSIS — R110 Nausea: Secondary | ICD-10-CM | POA: Insufficient documentation

## 2019-07-18 HISTORY — DX: Obesity, unspecified: E66.9

## 2019-07-18 HISTORY — DX: Gastro-esophageal reflux disease without esophagitis: K21.9

## 2019-07-18 LAB — COMPREHENSIVE METABOLIC PANEL
ALT: 61 U/L — ABNORMAL HIGH (ref 0–44)
AST: 34 U/L (ref 15–41)
Albumin: 4.7 g/dL (ref 3.5–5.0)
Alkaline Phosphatase: 58 U/L (ref 38–126)
Anion gap: 11 (ref 5–15)
BUN: 15 mg/dL (ref 6–20)
CO2: 22 mmol/L (ref 22–32)
Calcium: 9.3 mg/dL (ref 8.9–10.3)
Chloride: 101 mmol/L (ref 98–111)
Creatinine, Ser: 0.94 mg/dL (ref 0.61–1.24)
GFR calc Af Amer: >60 mL/min (ref >60)
GFR calc non Af Amer: >60 mL/min (ref >60)
Glucose, Bld: 211 mg/dL — ABNORMAL HIGH (ref 70–99)
Potassium: 4 mmol/L (ref 3.5–5.1)
Sodium: 134 mmol/L — ABNORMAL LOW (ref 135–145)
Total Bilirubin: 1.6 mg/dL — ABNORMAL HIGH (ref 0.3–1.2)
Total Protein: 7.3 g/dL (ref 6.5–8.1)

## 2019-07-18 LAB — CBC WITH DIFFERENTIAL/PLATELET
Abs Immature Granulocytes: 0.1 10*3/uL — ABNORMAL HIGH (ref 0.00–0.07)
Basophils Absolute: 0.1 10*3/uL (ref 0.0–0.1)
Basophils Relative: 1 %
Eosinophils Absolute: 0.6 10*3/uL — ABNORMAL HIGH (ref 0.0–0.5)
Eosinophils Relative: 5 %
HCT: 47.4 % (ref 39.0–52.0)
Hemoglobin: 16.8 g/dL (ref 13.0–17.0)
Immature Granulocytes: 1 %
Lymphocytes Relative: 33 %
Lymphs Abs: 3.7 10*3/uL (ref 0.7–4.0)
MCH: 30.3 pg (ref 26.0–34.0)
MCHC: 35.4 g/dL (ref 30.0–36.0)
MCV: 85.6 fL (ref 80.0–100.0)
Monocytes Absolute: 1 10*3/uL (ref 0.1–1.0)
Monocytes Relative: 9 %
Neutro Abs: 5.9 10*3/uL (ref 1.7–7.7)
Neutrophils Relative %: 51 %
Platelets: 238 10*3/uL (ref 150–400)
RBC: 5.54 MIL/uL (ref 4.22–5.81)
RDW: 12.4 % (ref 11.5–15.5)
WBC: 11.3 10*3/uL — ABNORMAL HIGH (ref 4.0–10.5)
nRBC: 0 % (ref 0.0–0.2)

## 2019-07-18 LAB — URINALYSIS, MICROSCOPIC (REFLEX)

## 2019-07-18 LAB — URINALYSIS, ROUTINE W REFLEX MICROSCOPIC
Bilirubin Urine: NEGATIVE
Glucose, UA: NEGATIVE mg/dL
Hgb urine dipstick: NEGATIVE
Ketones, ur: 15 mg/dL — AB
Leukocytes,Ua: NEGATIVE
Nitrite: NEGATIVE
Protein, ur: 30 mg/dL — AB
Specific Gravity, Urine: >1.03 — ABNORMAL HIGH (ref 1.005–1.030)
pH: 5.5 (ref 5.0–8.0)

## 2019-07-18 LAB — LIPASE, BLOOD: Lipase: 105 U/L — ABNORMAL HIGH (ref 11–51)

## 2019-07-18 MED ORDER — ONDANSETRON 4 MG PO TBDP: 4.0000 mg | ORAL_TABLET | Freq: Three times a day (TID) | ORAL | 0 refills | Status: DC | PRN

## 2019-07-18 MED ORDER — SODIUM CHLORIDE 0.9 % IV BOLUS
1000.0000 mL | Freq: Once | INTRAVENOUS | Status: AC
  Administered 2019-07-18: 1000 mL via INTRAVENOUS

## 2019-07-18 NOTE — ED Notes (Signed)
ED Provider at bedside. 

## 2019-07-18 NOTE — ED Notes (Signed)
Pt on monitor 

## 2019-07-18 NOTE — ED Triage Notes (Signed)
Started Victoza in November. For a few weeks has been having nausea.  For 3-4 days has been having Left sided abd pain below rib area. Called his pmd and she sent him here.

## 2019-07-18 NOTE — ED Provider Notes (Signed)
[facility] EMERGENCY DEPARTMENT Provider Note   CSN: [id number] Arrival date & time: 07/18/19  [id number]     History Chief Complaint  Patient presents with  . Abdominal Pain    Eddie Hancock is a 49 y.o. male.  The history is provided by the patient and medical records. No language interpreter was used.  Abdominal Pain  Eddie Hancock is a 49 y.o. male who presents to the Emergency Department complaining of abdominal pain and nausea. He presents the emergency department for evaluation of progressive left sided abdominal pain and nausea. About 5 to 6 weeks ago he was started on Victoza. Since initiating the medication he is experienced progressive nausea. His nausea has worsened over the last few days. He also began feeling left mid abdominal discomfort for the last 3 to 4 days. The pain is described as an aching and discomfort type sensation that comes and goes. There is no change with meals, activity or positioning. He denies any fevers, loss of taste or smell, chest pain, shortness of breath, vomiting, diarrhea, dysuria, leg swelling or pain. No known COVID 19 exposures. His nausea has been so profound that he is called out of work a few days this week. He is able to eat and drink without difficulty despite his nausea. He presents the emergency department because he called his doctor about the symptoms and he was referred for further evaluation.    Past Medical History:  Diagnosis Date  . Diabetes mellitus without complication ([place])   . GERD (gastroesophageal reflux disease)   . Hypertension   . Obesity     Patient Active Problem List   Diagnosis Date Noted  . BMI 40.0-44.9, adult ([place]) 07/15/2018  . Morbid obesity ([place]) 07/15/2018  . Insomnia 11/29/2017  . Diabetes mellitus without complication ([place]) 49/67/5916  . Hyperlipidemia associated with type 2 diabetes mellitus ([place]) 06/21/2017  . Healthcare maintenance 06/21/2017  . Contact dermatitis 10/17/2016  . Benign  essential hypertension 11/04/2015  . Chronic low back pain without sciatica 11/04/2015  . Hyperglycemia 11/04/2015  . Hypertriglyceridemia 11/04/2015  . Seasonal allergic rhinitis due to pollen 11/04/2015    Past Surgical History:  Procedure Laterality Date  . APPENDECTOMY         Family History  Problem Relation Age of Onset  . Hypertension Mother   . Diabetes Daughter   . Learning disabilities Maternal Grandmother   . Learning disabilities Maternal Grandfather     Social History   Tobacco Use  . Smoking status: Former Smoker    Quit date: 11/07/2016    Years since quitting: 2.6  . Smokeless tobacco: Never Used  Substance Use Topics  . Alcohol use: No  . Drug use: No    Home Medications Prior to Admission medications   Medication Sig Start Date End Date Taking? Authorizing Provider  allopurinol (ZYLOPRIM) 300 MG tablet TAKE 1 TABLET(300 MG) BY MOUTH DAILY 05/12/19   Danford, Valetta Fuller D, NP  amLODipine (NORVASC) 5 MG tablet TAKE 1 TABLET BY MOUTH DAILY. ABSOLUTELY NO FURTHER REFILLS UNTIL PATIENT IS SEEN 04/29/19   Danford, Valetta Fuller D, NP  atorvastatin (LIPITOR) 10 MG tablet Take 1/2 tablet nightly 07/15/18   Danford, Katy D, NP  Blood Glucose Monitoring Suppl (CONTOUR NEXT ONE) KIT 1 each by Does not apply route 2 (two) times daily. Check fasting blood sugar in morning and then recheck blood sugar after largest meal. 11/08/16   Danford, Valetta Fuller D, NP  cyclobenzaprine (FLEXERIL) 10 MG tablet TAKE 1  TABLET(10 MG) BY MOUTH THREE TIMES DAILY AS NEEDED FOR MUSCLE SPASMS 09/22/18   Danford, Valetta Fuller D, NP  ezetimibe (ZETIA) 10 MG tablet TAKE 1 TABLET BY MOUTH DAILY. ABSOLUTELY NO FURTHER REFILL UNTIL PATIENT IS SEEN 06/24/19   Danford, Valetta Fuller D, NP  famotidine (PEPCID) 20 MG tablet Take 20 mg by mouth daily.    [provider]  glucose blood test strip Use to check blood sugars three times daily 02/12/17   Danford, Valetta Fuller D, NP  Insulin Pen Needle (PEN NEEDLES 31GX5/16") 31G X 8 MM MISC 1 each by  Does not apply route daily. Use one needle daily with Victoza injection 06/10/19   Danford, Valetta Fuller D, NP  lisinopril (ZESTRIL) 40 MG tablet TAKE 1 TABLET(40 MG) BY MOUTH DAILY 05/12/19   Danford, Valetta Fuller D, NP  meloxicam (MOBIC) 7.5 MG tablet Take 1 tablet (7.5 mg total) by mouth 2 (two) times daily with a meal. As needed 04/21/19   Mina Marble D, NP  metFORMIN (GLUCOPHAGE) 1000 MG tablet Take 1 tablet (1,000 mg total) by mouth 2 (two) times daily with a meal. 11/12/18   Danford, Katy D, NP  Multiple Vitamin (MULTIVITAMIN) tablet Take 1 tablet by mouth daily.    [provider]  omega-3 acid ethyl esters (LOVAZA) 1 g capsule Take 2 capsules (2 g total) by mouth 2 (two) times daily. 05/29/19   Danford, Valetta Fuller D, NP  ondansetron (ZOFRAN ODT) 4 MG disintegrating tablet Take 1 tablet (4 mg total) by mouth every 8 (eight) hours as needed for nausea or vomiting. 07/18/19   Quintella Reichert, MD  liraglutide (VICTOZA) 18 MG/3ML SOPN Inject 0.1 mLs (0.6 mg total) into the skin daily for 7 days, THEN 0.2 mLs (1.2 mg total) daily for 7 days, THEN 0.2 mLs (1.2 mg total) daily for 7 days. 04/29/19 07/18/19  Mina Marble D, NP    Allergies    Cetirizine  Review of Systems   Review of Systems  Gastrointestinal: Positive for abdominal pain.  All other systems reviewed and are negative.   Physical Exam Updated Vital Signs BP (!) 152/88 (BP Location: Right Arm)   Pulse 86   Temp 98.5 F (36.9 C) (Oral)   Resp 16   Ht '6\' 1"'$  (1.854 m)   Wt (!) 148.6 kg   SpO2 100%   BMI 43.21 kg/m   Physical Exam Vitals and nursing note reviewed.  Constitutional:      Appearance: He is well-developed.  HENT:     Head: Normocephalic and atraumatic.  Cardiovascular:     Rate and Rhythm: Normal rate and regular rhythm.  Pulmonary:     Effort: Pulmonary effort is normal. No respiratory distress.  Abdominal:     Palpations: Abdomen is soft.     Tenderness: There is no abdominal tenderness. There is no guarding or  rebound.     Hernia: No hernia is present.  Musculoskeletal:        General: No tenderness.  Skin:    General: Skin is warm and dry.  Neurological:     Mental Status: He is alert and oriented to person, place, and time.  Psychiatric:        Behavior: Behavior normal.     ED Results / Procedures / Treatments   Labs (all labs ordered are listed, but only abnormal results are displayed) Labs Reviewed  COMPREHENSIVE METABOLIC PANEL - Abnormal; Notable for the following components:      Result Value   Sodium 134 (*)  Glucose, Bld 211 (*)    ALT 61 (*)    Total Bilirubin 1.6 (*)    All other components within normal limits  LIPASE, BLOOD - Abnormal; Notable for the following components:   Lipase 105 (*)    All other components within normal limits  CBC WITH DIFFERENTIAL/PLATELET - Abnormal; Notable for the following components:   WBC 11.3 (*)    Eosinophils Absolute 0.6 (*)    Abs Immature Granulocytes 0.10 (*)    All other components within normal limits  URINALYSIS, ROUTINE W REFLEX MICROSCOPIC - Abnormal; Notable for the following components:   Specific Gravity, Urine >1.030 (*)    Ketones, ur 15 (*)    Protein, ur 30 (*)    All other components within normal limits  URINALYSIS, MICROSCOPIC (REFLEX) - Abnormal; Notable for the following components:   Bacteria, UA MANY (*)    All other components within normal limits  URINE CULTURE    EKG EKG Interpretation  Date/Time:  Friday July 18 2019 10:03:17 EST Ventricular Rate:  86 PR Interval:    QRS Duration: 86 QT Interval:  338 QTC Calculation: 405 R Axis:   6 Text Interpretation: Sinus rhythm Confirmed by Quintella Reichert [phone]) on 07/18/2019 10:07:42 AM   Radiology US Abdomen Limited RUQ  Result Date: 07/18/2019 CLINICAL DATA:  Right upper quadrant pain EXAM: ULTRASOUND ABDOMEN LIMITED RIGHT UPPER QUADRANT COMPARISON:  None. FINDINGS: Gallbladder: No gallstones or wall thickening visualized. No sonographic  Murphy sign noted by sonographer. Common bile duct: Diameter: Normal caliber, 6 mm Liver: Increased echotexture compatible with fatty infiltration. No focal abnormality or biliary ductal dilatation. Portal vein is patent on color Doppler imaging with normal direction of blood flow towards the liver. Other: None. IMPRESSION: Fatty infiltration of the liver. No acute findings. Electronically Signed   By: Rolm Baptise M.D.   On: 07/18/2019 10:39    Procedures Procedures (including critical care time)  Medications Ordered in ED Medications  sodium chloride 0.9 % bolus 1,000 mL (1,000 mLs Intravenous New Bag/Given 07/18/19 1003)    ED Course  I have reviewed the triage vital signs and the nursing notes.  Pertinent labs & imaging results that were available during my care of the patient were reviewed by me and considered in my medical decision making (see chart for details).    MDM Rules/Calculators/A&P                     Patient here for evaluation of nausea, left-sided abdominal pain. He is non-toxic appearing on evaluation. No significant abdominal tenderness on ED evaluation. Labs with minimal leukocytosis as well as elevation in transaminases and lipase. Presentation is not currently consistent with pancreatitis. Right upper quadrant ultrasound is negative for gallstones. Presentation is not consistent with ACS, PE, dissection, AAA, renal colic. Urinalysis does note bacteria present but no other findings consistent with infection, will send a culture and only treat if positive. The patient does have a penile piercing, that may contribute to the bacterial contaminant. Discussed discontinuing his toes at this time because of his worsening nausea and uptrend in his pancreatic enzyme. Discussed outpatient follow-up as well as return precautions.  Final Clinical Impression(s) / ED Diagnoses Final diagnoses:  RUQ abdominal pain  Nausea  Left upper quadrant abdominal pain    Rx / DC Orders ED  Discharge Orders         Ordered    ondansetron (ZOFRAN ODT) 4 MG disintegrating tablet  Every 8 hours  PRN     07/18/19 1050           Quintella Reichert, MD 07/18/19 1053

## 2019-07-19 LAB — URINE CULTURE: Culture: <10000 — AB

## 2019-07-28 ENCOUNTER — Other Ambulatory Visit: Payer: Self-pay | Admitting: Adult Health

## 2019-07-28 DIAGNOSIS — I10 Essential (primary) hypertension: Secondary | ICD-10-CM

## 2019-07-29 ENCOUNTER — Telehealth: Payer: Self-pay

## 2019-07-29 NOTE — Telephone Encounter (Signed)
Please call pt to schedule appt.  No further refills until pt is seen.  [name], CMA  

## 2019-08-06 ENCOUNTER — Other Ambulatory Visit: Payer: Self-pay | Admitting: Adult Health

## 2019-08-06 ENCOUNTER — Telehealth: Payer: Self-pay

## 2019-08-06 NOTE — Telephone Encounter (Signed)
Please call pt to schedule appt.  No further refills until pt is seen.  T. Amr Sturtevant, CMA  

## 2019-08-08 ENCOUNTER — Telehealth: Payer: Self-pay | Admitting: Adult Health

## 2019-08-08 NOTE — Telephone Encounter (Signed)
Left patient message that pharmacy sent Rx refill request (pt hasn't been seen since Oct 2020 & provider needs labs & OV (appt)  --Forwarding FYI to med asst.  --glh

## 2019-08-23 ENCOUNTER — Other Ambulatory Visit: Payer: Self-pay | Admitting: Adult Health

## 2019-08-28 ENCOUNTER — Other Ambulatory Visit: Payer: Self-pay | Admitting: Adult Health

## 2019-08-28 DIAGNOSIS — I10 Essential (primary) hypertension: Secondary | ICD-10-CM

## 2019-09-01 ENCOUNTER — Telehealth: Payer: Self-pay | Admitting: Adult Health

## 2019-09-01 NOTE — Telephone Encounter (Signed)
Patient has scheduled an e visit f/u for med refills for 09/11/19. He is currently out of his fish oils and is hoping we will send in a refill to his [facility] pharm since he has made this appt.

## 2019-09-02 NOTE — Telephone Encounter (Signed)
Patient advised that per our refill protocol we are unable to give further refills after 30 day supply and 15 day supply given and that he will have to keep apt for 09/11/19 for further refills. Patient verbalized understanding. AS, CMA

## 2019-09-04 ENCOUNTER — Other Ambulatory Visit: Payer: Self-pay | Admitting: Adult Health

## 2019-09-11 ENCOUNTER — Ambulatory Visit (INDEPENDENT_AMBULATORY_CARE_PROVIDER_SITE_OTHER): Payer: BC Managed Care – PPO | Admitting: Family Medicine

## 2019-09-11 ENCOUNTER — Encounter: Payer: Self-pay | Admitting: Family Medicine

## 2019-09-11 ENCOUNTER — Other Ambulatory Visit: Payer: Self-pay

## 2019-09-11 VITALS — Wt 332.0 lb

## 2019-09-11 DIAGNOSIS — K859 Acute pancreatitis without necrosis or infection, unspecified: Secondary | ICD-10-CM | POA: Insufficient documentation

## 2019-09-11 DIAGNOSIS — I152 Hypertension secondary to endocrine disorders: Secondary | ICD-10-CM | POA: Insufficient documentation

## 2019-09-11 DIAGNOSIS — E1159 Type 2 diabetes mellitus with other circulatory complications: Secondary | ICD-10-CM | POA: Diagnosis not present

## 2019-09-11 DIAGNOSIS — I10 Essential (primary) hypertension: Secondary | ICD-10-CM

## 2019-09-11 DIAGNOSIS — E1165 Type 2 diabetes mellitus with hyperglycemia: Secondary | ICD-10-CM | POA: Diagnosis not present

## 2019-09-11 DIAGNOSIS — E1169 Type 2 diabetes mellitus with other specified complication: Secondary | ICD-10-CM

## 2019-09-11 DIAGNOSIS — E782 Mixed hyperlipidemia: Secondary | ICD-10-CM

## 2019-09-11 MED ORDER — METFORMIN HCL 1000 MG PO TABS: 1000.0000 mg | ORAL_TABLET | Freq: Two times a day (BID) | ORAL | 0 refills | Status: DC

## 2019-09-11 MED ORDER — CYCLOBENZAPRINE HCL 10 MG PO TABS: ORAL_TABLET | ORAL | 0 refills | Status: DC

## 2019-09-11 MED ORDER — EZETIMIBE 10 MG PO TABS: ORAL_TABLET | ORAL | 0 refills | Status: DC

## 2019-09-11 MED ORDER — JARDIANCE 25 MG PO TABS: 25.0000 mg | ORAL_TABLET | Freq: Every day | ORAL | 0 refills | Status: DC

## 2019-09-11 MED ORDER — OMEGA-3-ACID ETHYL ESTERS 1 G PO CAPS: 1.0000 | ORAL_CAPSULE | Freq: Two times a day (BID) | ORAL | 0 refills | Status: DC

## 2019-09-11 MED ORDER — LISINOPRIL 40 MG PO TABS: ORAL_TABLET | ORAL | 0 refills | Status: DC

## 2019-09-11 MED ORDER — ALLOPURINOL 300 MG PO TABS: ORAL_TABLET | ORAL | 0 refills | Status: DC

## 2019-09-11 NOTE — Progress Notes (Signed)
Telehealth office visit note for Eddie Hancock, D.O- at Primary Care at [facility]   I connected with current patient today and verified that I am speaking with the correct person using two identifiers.   . Location of the patient: Home . Location of the provider: Office Only the patient (+/- their family members at pt's discretion) and myself were participating in the encounter - This visit type was conducted due to national recommendations for restrictions regarding the COVID-19 Pandemic (e.g. social distancing) in an effort to limit this patient's exposure and mitigate transmission in our community.  This format is felt to be most appropriate for this patient at this time.   - No physical exam could be performed with this format, beyond that communicated to [place] by the patient/ family members as noted.   - Additionally my office staff/ schedulers discussed with the patient that there may be a monetary charge related to this service, depending on their medical insurance.   The patient expressed understanding, and agreed to proceed.     _________________________________________________________________________________   History of Present Illness:   I, Toni Amend, am serving as Education administrator for [employer].   - Emergency visit in January 2021 - Acute Pancreatitis and Allergy to Victoza Notes he went to the emergency room in January for some symptoms at [facility] request.   When he called the clinic here, he was told to go to the emergency room due to concerns for cardiac event.  States at the time, he felt nauseous with pain in his lower left abdomen.  Notes "it had been that way for a week."  Says this incident occurred about two weeks after he upped his dose of Victoza.  Notes when he got to the emergency room, he had an EKG done "and the whole nine yards," and the hospital told him that his pancreatic enzymes were elevated.  The emergency room told patient to stop the Victoza  immediately.  Notes "I drink a ton of water, I love water."  Denies history of bone concerns.  HPI:   Diabetes Mellitus:  Home glucose readings:  Notes "my blood sugars are running about the same."  When he wakes up in the morning, his sugars are in the 170-180 range.  Says "that's about where I've been."  While he was on the Victoza injectable prior to his episode of pancreatitis, his sugar was in the 160's.  Notes "wasn't a terribly good improvement."  Says the Victoza, which he was only taking for a few weeks, "didn't really seem to be making much difference."  Says "It didn't seem to be doing so much in terms of blood sugar changes, maybe a few points, but not much."   - Patient reports good compliance with therapy plan: medication and/or lifestyle modification  - His denies acute concerns or problems related to treatment plan  - He denies new concerns.  Denies polyuria/polydipsia, hypo/ hyperglycemia symptoms.  Denies new onset of: chest pain, exercise intolerance, shortness of breath, dizziness, visual changes, headache, lower extremity swelling or claudication.   Last A1C in the office was:  Lab Results  Component Value Date   HGBA1C 8.9 (H) 04/28/2019   HGBA1C 7.7 (H) 11/11/2018   HGBA1C 7.4 (A) 07/15/2018   Lab Results  Component Value Date   MICROALBUR 10 11/29/2017   LDLCALC 29 11/11/2018   CREATININE 0.94 07/18/2019   BP Readings from Last 3 Encounters:  07/18/19 131/79  07/15/18 126/88  02/27/18 135/82  Wt Readings from Last 3 Encounters:  09/11/19 (!) 332 lb (150.6 kg)  07/18/19 (!) 327 lb 8 oz (148.6 kg)  10/28/18 (!) 331 lb 9.6 oz (150.4 kg)   HPI:  Hypertension:  Patient does not check his blood pressure at home.  - Patient reports good compliance with medication and/or lifestyle modification  - His denies acute concerns or problems related to treatment plan  - He denies new onset of: chest pain, exercise intolerance, shortness of breath, dizziness,  visual changes, headache, lower extremity swelling or claudication.   Last 3 blood pressure readings in our office are as follows: BP Readings from Last 3 Encounters:  07/18/19 131/79  07/15/18 126/88  02/27/18 135/82   Filed Weights   09/11/19 1148  Weight: (!) 332 lb (150.6 kg)   HPI:  Hyperlipidemia:  49 y.o. male here for cholesterol follow-up.   Notes he stopped taking fish oil (Lovaza) for a few weeks and "my body aches seem to be a lot less."  - Patient reports good compliance with treatment plan of:  medication and/ or lifestyle management.    - Patient denies any acute concerns or problems with management plan   - He denies new onset of: myalgias, arthralgias, increased fatigue more than normal, chest pains, exercise intolerance, shortness of breath, dizziness, visual changes, headache, lower extremity swelling or claudication.   Most recent cholesterol panel was:  Lab Results  Component Value Date   CHOL 97 (L) 11/11/2018   HDL 37 (L) 11/11/2018   LDLCALC 29 11/11/2018   TRIG 155 (H) 11/11/2018   CHOLHDL 2.6 11/11/2018   Hepatic Function Latest Ref Rng & Units 07/18/2019 04/28/2019 11/11/2018  Total Protein 6.5 - 8.1 g/dL 7.3 6.6 6.3  Albumin 3.5 - 5.0 g/dL 4.7 4.5 4.5  AST 15 - 41 U/L 34 25 62(H)  ALT 0 - 44 U/L 61(H) 49(H) 115(H)  Alk Phosphatase 38 - 126 U/L 58 61 54  Total Bilirubin 0.3 - 1.2 mg/dL 1.6(H) 1.4(H) 0.7  Bilirubin, Direct 0.00 - 0.40 mg/dL - - -        No flowsheet data found.  Depression screen [facility] 2/9 04/21/2019 10/28/2018 07/15/2018 02/27/2018 11/29/2017  Decreased Interest 0 0 0 0 0  Down, Depressed, Hopeless 0 0 0 0 0  PHQ - 2 Score 0 0 0 0 0  Altered sleeping _0 Tired, decreased energy 2 0 _1 Change in appetite 0 0 0 0 0  Feeling bad or failure about yourself  0 0 0 0 0  Trouble concentrating 0 0 0 0 0  Moving slowly or fidgety/restless 0 0 0 0 0  Suicidal thoughts 0 0 0 0 -  PHQ-9 Score _2 Difficult doing  work/chores Not difficult at all Not difficult at all Not difficult at all Not difficult at all Somewhat difficult      Impression and Recommendations:    1. Poorly controlled diabetes mellitus ([place])   2. Hypertension associated with diabetes ([place])   3. Mixed diabetic hyperlipidemia associated with type 2 diabetes mellitus ([place])   4. Acute pancreatitis, Secondary to liraglutide / GLP-1   5. Morbid obesity ([place])      - Last OV on 04/21/2019.  Poorly Controlled Diabetes Mellitus   - h/o Acute Pancreatitis Secondary to Liraglutide / GLP-1 - Last A1c four months ago measured at 8.9, elevated, up from 7.7 ten months ago.   - Discussed need for  patient to return near future for A1c re-check.  - Advised patient of critical need to reduce patient's A1c from several angles.  - Reviewed and recommended changes in treatment plan today, including multiple medications and improved lifestyle habits.  - Due to history of acute pancreatitis caused by GLP-1/ Victoza, DPP-4 would be contraindicated due to high risk of pancreatitis with use of this drug class.  - Begin Jardiance today.  See med list. - Told patient to begin at half-tablet daily for a week, until his body adjusts to the medication.  - Advised patient to take his metformin on a full stomach.  - Counseled patient on pathophysiology of disease and discussed various treatment options, which always includes dietary and lifestyle modification as first line.  Strongly emphasized importance of adopting prudent habits to reduce intake of carbs and reduce his BMI.    - Importance of low carb, heart-healthy diet discussed with patient in addition to regular aerobic exercise of 30mn 5d/week or more.   - Advised patient to check his FBS and 2 hours after the biggest meal of his day.  Keep log and bring in next OV for my review.     - Also told patient if you ever feel poorly, please check your blood pressure and blood sugar, as one or the  other could be the cause of your symptoms.  - Pt reminded about need for yearly eye and foot exams.  Told patient to make appt.for diabetic eye exam, CMAs here will do foot exams  - We will continue to monitor and re-check A1c near future.  Hypertension associated with DM - Patient is not currently checking his blood pressure at home. - Advised patient to purchase an upper arm blood pressure cuff for use at home.  - Patient will continue current treatment regimen.  See med list.  - Counseled patient on pathophysiology of disease and discussed various treatment options, which always includes dietary and lifestyle modification as first line.  - Lifestyle changes such as dash and heart healthy diets and engaging in a regular exercise program discussed extensively with patient.   - Ambulatory blood pressure monitoring encouraged at least 3 times weekly.  Keep log and bring in every office visit.  Reminded patient that if they ever feel poorly in any way, to check their blood pressure and pulse.  - We will continue to monitor.  Mixed Diabetic Hyperlipidemia associated with Type 2 DM - Need for re-check cholesterol in near future.  - Due to patient's complaint of body aches, which patient feels is related to when he started Lovaza, advised reducing dose of Lovaza today. - Patient will take Lovaza at half-dose. - Discussed that if patient continues to experience body aches, he may discontinue Lovaza.  - Otherwise, pt will continue current treatment regimen.  See med list.  - Encouraged patient to follow AHA guidelines for regular exercise and also engage in weight loss if BMI above 25.   - We will continue to monitor and re-check as discussed.  Recommendations - Advised patient to return for chronic follow-up every 3 months as recommended. - Return near future to check labs, and return in 6-8 weeks to review blood sugars. -No need for FLP recheck as it was at goal 10 months ago    -  As part of my medical decision making, I reviewed the following data within the [facility] obtained from pt /family, CMA notes reviewed and incorporated if applicable, Labs reviewed, Radiograph/  tests reviewed if applicable and OV notes from prior OV's with me, as well as other specialists she/he has seen since seeing me last, were all reviewed and used in my medical decision making process today.    - Additionally, discussion had with patient regarding our treatment plan, and their biases/concerns about that plan were used in my medical decision making today.    - The patient agreed with the plan and demonstrated an understanding of the instructions.   No barriers to understanding were identified.      Return for f/up VERYnear future re-check labs, f/up OV in 6-8 weeks w/BP&BS logs/ started jardiance.    -Patient requested 90-day supplies of meds and promised me he would come in in the next 2-5 business days for lab work  Orders Placed This Encounter  Procedures  . Lipase  . Hemoglobin A1c  . Comprehensive metabolic panel    Meds ordered this encounter  Medications  . allopurinol (ZYLOPRIM) 300 MG tablet    Sig: TAKE 1 TABLET(300 MG) BY MOUTH DAILY    Dispense:  90 tablet    Refill:  0  . cyclobenzaprine (FLEXERIL) 10 MG tablet    Sig: TAKE 1 TABLET DAILY AS NEEDED FOR MUSCLE SPASMS    Dispense:  90 tablet    Refill:  0  . omega-3 acid ethyl esters (LOVAZA) 1 g capsule    Sig: Take 1 capsule (1 g total) by mouth 2 (two) times daily.    Dispense:  180 capsule    Refill:  0  . ezetimibe (ZETIA) 10 MG tablet    Sig: TAKE 1 TABLET BY MOUTH DAILY.    Dispense:  90 tablet    Refill:  0  . lisinopril (ZESTRIL) 40 MG tablet    Sig: TAKE 1 TABLET(40 MG) BY MOUTH DAILY    Dispense:  90 tablet    Refill:  0  . metFORMIN (GLUCOPHAGE) 1000 MG tablet    Sig: Take 1 tablet (1,000 mg total) by mouth 2 (two) times daily with a meal.    Dispense:  180 tablet     Refill:  0  . empagliflozin (JARDIANCE) 25 MG TABS tablet    Sig: Take 25 mg by mouth daily before breakfast.    Dispense:  90 tablet    Refill:  0    Medications Discontinued During This Encounter  Medication Reason  . ondansetron (ZOFRAN ODT) 4 MG disintegrating tablet Patient Preference  . cyclobenzaprine (FLEXERIL) 10 MG tablet Reorder  . metFORMIN (GLUCOPHAGE) 1000 MG tablet Reorder  . ezetimibe (ZETIA) 10 MG tablet Reorder  . omega-3 acid ethyl esters (LOVAZA) 1 g capsule Reorder  . allopurinol (ZYLOPRIM) 300 MG tablet Reorder  . lisinopril (ZESTRIL) 40 MG tablet Reorder     Time spent on visit including pre-visit chart review and post-visit care was 27 minutes.  Note:  This note was prepared with assistance of Dragon voice recognition software. Occasional wrong-word or sound-a-like substitutions may have occurred due to the inherent limitations of voice recognition software.   The [place] was signed into law in 2016 which includes the topic of electronic health records.  This provides immediate access to information in MyChart.  This includes consultation notes, operative notes, office notes, lab results and pathology reports.  If you have any questions about what you read please let us know at your next visit or call us at the office.  We are right here with you.  This document serves  as a record of services personally performed by Eddie Dance, DO. It was created on her behalf by Toni Amend, a trained medical scribe. The creation of this record is based on the scribe's personal observations and the provider's statements to them.   This case required medical decision making of at least moderate complexity. The above documentation from Toni Amend, medical scribe, has been reviewed by Marjory Sneddon, D.O.    __________________________________________________________________________________     Patient Care Team    Relationship  Specialty Notifications Start End  Esaw Grandchild, NP PCP - General Family Medicine  10/17/16      -Vitals obtained; medications/ allergies reconciled;  personal medical, social, Sx etc.histories were updated by CMA, reviewed by me and are reflected in chart   Patient Active Problem List   Diagnosis Date Noted  . Hypertension associated with diabetes ([place]) 09/11/2019  . Mixed diabetic hyperlipidemia associated with type 2 diabetes mellitus ([place]) 09/11/2019  . Acute pancreatitis 09/11/2019  . BMI 40.0-44.9, adult ([place]) 07/15/2018  . Morbid obesity ([place]) 07/15/2018  . Insomnia 11/29/2017  . Diabetes mellitus without complication ([place]) 92/05/9416  . Hyperlipidemia associated with type 2 diabetes mellitus ([place]) 06/21/2017  . Healthcare maintenance 06/21/2017  . Contact dermatitis 10/17/2016  . Benign essential hypertension 11/04/2015  . Chronic low back pain without sciatica 11/04/2015  . Hyperglycemia 11/04/2015  . Hypertriglyceridemia 11/04/2015  . Seasonal allergic rhinitis due to pollen 11/04/2015     Current Meds  Medication Sig  . allopurinol (ZYLOPRIM) 300 MG tablet TAKE 1 TABLET(300 MG) BY MOUTH DAILY  . amLODipine (NORVASC) 5 MG tablet TAKE ONE TABLET BY MOUTH DAILY. **PATIENT NEEDS APT FOR FURTHER REFILLS**  . atorvastatin (LIPITOR) 10 MG tablet Take 1/2 tablet nightly  . Blood Glucose Monitoring Suppl (CONTOUR NEXT ONE) KIT 1 each by Does not apply route 2 (two) times daily. Check fasting blood sugar in morning and then recheck blood sugar after largest meal.  . cyclobenzaprine (FLEXERIL) 10 MG tablet TAKE 1 TABLET DAILY AS NEEDED FOR MUSCLE SPASMS  . ezetimibe (ZETIA) 10 MG tablet TAKE 1 TABLET BY MOUTH DAILY.  . famotidine (PEPCID) 20 MG tablet Take 20 mg by mouth daily.  Marland Kitchen glucose blood test strip Use to check blood sugars three times daily  . Insulin Pen Needle (PEN NEEDLES 31GX5/16") 31G X 8 MM MISC 1 each by Does not apply route daily. Use one needle daily with  Victoza injection  . lisinopril (ZESTRIL) 40 MG tablet TAKE 1 TABLET(40 MG) BY MOUTH DAILY  . meloxicam (MOBIC) 7.5 MG tablet Take 1 tablet (7.5 mg total) by mouth 2 (two) times daily with a meal. As needed  . metFORMIN (GLUCOPHAGE) 1000 MG tablet Take 1 tablet (1,000 mg total) by mouth 2 (two) times daily with a meal.  . Multiple Vitamin (MULTIVITAMIN) tablet Take 1 tablet by mouth daily.  . [DISCONTINUED] allopurinol (ZYLOPRIM) 300 MG tablet TAKE 1 TABLET(300 MG) BY MOUTH DAILY  . [DISCONTINUED] cyclobenzaprine (FLEXERIL) 10 MG tablet TAKE 1 TABLET(10 MG) BY MOUTH THREE TIMES DAILY AS NEEDED FOR MUSCLE SPASMS  . [DISCONTINUED] ezetimibe (ZETIA) 10 MG tablet TAKE 1 TABLET BY MOUTH DAILY. ABSOLUTELY NO FURTHER REFILL UNTIL PATIENT IS SEEN  . [DISCONTINUED] lisinopril (ZESTRIL) 40 MG tablet TAKE 1 TABLET(40 MG) BY MOUTH DAILY  . [DISCONTINUED] metFORMIN (GLUCOPHAGE) 1000 MG tablet Take 1 tablet (1,000 mg total) by mouth 2 (two) times daily with a meal.     Allergies:  Allergies  Allergen Reactions  .  Cetirizine Shortness Of Breath  . Liraglutide Other (See Comments)    Severe pancreatitis  . Onglyza [Saxagliptin] Other (See Comments)    Potential side effect of pancreatitis     ROS:  See above HPI for pertinent positives and negatives   Objective:   Weight (!) 332 lb (150.6 kg).  (if some vitals are omitted, this means that patient was UNABLE to obtain them even though they were asked to get them prior to OV today.  They were asked to call us at their earliest convenience with these once obtained. )  General: A & O * 3; sounds in no acute distress; in usual state of health.  Skin: Pt confirms warm and dry extremities and pink fingertips HEENT: Pt confirms lips non-cyanotic Chest: Patient confirms normal chest excursion and movement Respiratory: speaking in full sentences, no conversational dyspnea; patient confirms no use of accessory muscles Psych: insight appears good, mood-  appears full

## 2019-09-15 ENCOUNTER — Other Ambulatory Visit: Payer: BC Managed Care – PPO

## 2019-09-15 ENCOUNTER — Other Ambulatory Visit: Payer: Self-pay

## 2019-09-15 DIAGNOSIS — E1159 Type 2 diabetes mellitus with other circulatory complications: Secondary | ICD-10-CM

## 2019-09-15 DIAGNOSIS — E1169 Type 2 diabetes mellitus with other specified complication: Secondary | ICD-10-CM | POA: Diagnosis not present

## 2019-09-15 DIAGNOSIS — E1165 Type 2 diabetes mellitus with hyperglycemia: Secondary | ICD-10-CM

## 2019-09-15 DIAGNOSIS — K859 Acute pancreatitis without necrosis or infection, unspecified: Secondary | ICD-10-CM

## 2019-09-15 DIAGNOSIS — I152 Hypertension secondary to endocrine disorders: Secondary | ICD-10-CM

## 2019-09-15 DIAGNOSIS — I10 Essential (primary) hypertension: Secondary | ICD-10-CM | POA: Diagnosis not present

## 2019-09-16 ENCOUNTER — Other Ambulatory Visit: Payer: Self-pay | Admitting: Adult Health

## 2019-09-16 LAB — COMPREHENSIVE METABOLIC PANEL
ALT: 57 IU/L — ABNORMAL HIGH (ref 0–44)
AST: 27 IU/L (ref 0–40)
Albumin/Globulin Ratio: 2.8 — ABNORMAL HIGH (ref 1.2–2.2)
Albumin: 4.5 g/dL (ref 4.0–5.0)
Alkaline Phosphatase: 67 IU/L (ref 39–117)
BUN/Creatinine Ratio: 11 (ref 9–20)
BUN: 11 mg/dL (ref 6–24)
Bilirubin Total: 0.8 mg/dL (ref 0.0–1.2)
CO2: 22 mmol/L (ref 20–29)
Calcium: 9.1 mg/dL (ref 8.7–10.2)
Chloride: 102 mmol/L (ref 96–106)
Creatinine, Ser: 1.04 mg/dL (ref 0.76–1.27)
GFR calc Af Amer: 98 mL/min/{1.73_m2} (ref >59)
GFR calc non Af Amer: 85 mL/min/{1.73_m2} (ref >59)
Globulin, Total: 1.6 g/dL (ref 1.5–4.5)
Glucose: 236 mg/dL — ABNORMAL HIGH (ref 65–99)
Potassium: 5 mmol/L (ref 3.5–5.2)
Sodium: 139 mmol/L (ref 134–144)
Total Protein: 6.1 g/dL (ref 6.0–8.5)

## 2019-09-16 LAB — HEMOGLOBIN A1C
Est. average glucose Bld gHb Est-mCnc: 194 mg/dL
Hgb A1c MFr Bld: 8.4 % — ABNORMAL HIGH (ref 4.8–5.6)

## 2019-09-16 LAB — LIPASE: Lipase: 71 U/L (ref 13–78)

## 2019-09-18 ENCOUNTER — Other Ambulatory Visit: Payer: Self-pay

## 2019-09-18 DIAGNOSIS — I10 Essential (primary) hypertension: Secondary | ICD-10-CM

## 2019-09-18 MED ORDER — AMLODIPINE BESYLATE 5 MG PO TABS: ORAL_TABLET | ORAL | 0 refills | Status: DC

## 2019-10-02 ENCOUNTER — Other Ambulatory Visit: Payer: Self-pay | Admitting: Adult Health

## 2019-10-02 ENCOUNTER — Telehealth: Payer: Self-pay

## 2019-10-02 MED ORDER — CONTOUR NEXT ONE KIT: 1.0000 | PACK | Freq: Two times a day (BID) | 0 refills | Status: AC

## 2019-10-02 NOTE — Telephone Encounter (Signed)
Pt called back stating that pharmacy is still telling me that they are awaiting a PA for the Jardiance.  Called [employer] and spoke with Cictortia who states that the medication is covered under the pt's plan and does not require PA.  However, it appears that the pharmacy is submitting the RX as and "institutional pack", which they do not cover.  Cictoria also states that the [facility] they are submitting is incorrect for the tablets only.  Spoke with Caryl Pina at [place] and advised her of this information.  Advised her that the pharmacy will need to call [employer] to inquire as to what they are doing incorrectly.   Pt informed of this information.  [name], CMA

## 2019-10-02 NOTE — Telephone Encounter (Signed)
Checked with [email or web address] and PA request was submitted on 09/19/2019.  Request cancelled by insurance company because   "[place] ([place]: [id number])  This request has received a Cancelled outcome.  This may mean either your patient does not have active coverage with this plan, this authorization was processed as a duplicate request, or an authorization was not needed for this medication.  Note any additional information provided by Sherre Poot Dubach at the bottom of this request, and contact [employer] [place] directly for further details."  Advised pt of this information and advised him to have pharmacy run the prescription again after verifying that they have all correct insurance information to determine if the insurance is going to pay for the medication.  Advised pt to call back if he has any further problems.  Pt expressed understanding and is agreeable.  [name], CMA

## 2019-10-02 NOTE — Telephone Encounter (Signed)
Patient called to request refill on Contour Next meter test strips & also to check up on status of Rx :   empagliflozin (JARDIANCE) 25 MG TABS tablet [id number]   Order Details Dose: 25 mg Route: Oral Frequency: Daily before breakfast  Dispense Quantity: 90 tablet Refills: 0       Sig: Take 25 mg by mouth daily before breakfast.   ----- Acquanetta Belling note to med asst that ( Per patient he never rcvd this prescription)thought at one time his Ins Co had denied but has heard no update either way.( Does doctor want him to take or is she going to prescribe a different Rx ???)  -- Please send (Glucose meter test strips to :     [place], [place] [phone] (Phone) [phone] (Fax   -- Please call pt to status of Jardiance Rx [phone].  glh

## 2019-10-02 NOTE — Telephone Encounter (Signed)
Test strips sent to pharmacy. AS, CMA

## 2019-10-03 ENCOUNTER — Telehealth: Payer: Self-pay | Admitting: Adult Health

## 2019-10-03 NOTE — Telephone Encounter (Signed)
Patient called and stated that test strip order that was placed to pharm was for a kit and not the test strips. The kit did not come with any testing strips, he needs an order for this sent to pharm please

## 2019-10-06 MED ORDER — GLUCOSE BLOOD VI STRP: ORAL_STRIP | 4 refills | Status: DC

## 2019-10-06 NOTE — Addendum Note (Signed)
Addended by: Mickel Crow on: 10/06/2019 07:59 AM   Modules accepted: Orders

## 2019-10-16 ENCOUNTER — Telehealth: Payer: Self-pay

## 2019-10-16 NOTE — Telephone Encounter (Signed)
Received fax from [employer] that PA for Lovaza has been denied.  Please advise on alternative therapy.  [name], CMA

## 2019-10-20 NOTE — Telephone Encounter (Signed)
Will hold off on the Lovaza for now and continue to monitor triglycerides. Previous triglycerides have been wnl's. If we can get better control of diabetes in addition to lifestyle modifications, it will help decrease and manage hypertriglyceridemia.  Thank you! Lyndell Allaire

## 2019-10-20 NOTE — Telephone Encounter (Signed)
01/19/2020  LVM for pt to call to discuss.  [name], CMA

## 2019-10-22 NOTE — Telephone Encounter (Signed)
10/22/2019  Pt informed.  Pt expressed understanding and is agreeable.  [name], CMA

## 2019-12-13 ENCOUNTER — Other Ambulatory Visit: Payer: Self-pay | Admitting: Family Medicine

## 2019-12-13 DIAGNOSIS — I10 Essential (primary) hypertension: Secondary | ICD-10-CM

## 2019-12-18 ENCOUNTER — Other Ambulatory Visit: Payer: Self-pay | Admitting: Family Medicine

## 2019-12-18 DIAGNOSIS — I10 Essential (primary) hypertension: Secondary | ICD-10-CM

## 2019-12-19 ENCOUNTER — Telehealth: Payer: Self-pay | Admitting: Adult Health

## 2019-12-19 ENCOUNTER — Other Ambulatory Visit: Payer: Self-pay | Admitting: Family Medicine

## 2019-12-19 DIAGNOSIS — I10 Essential (primary) hypertension: Secondary | ICD-10-CM

## 2019-12-19 MED ORDER — AMLODIPINE BESYLATE 5 MG PO TABS: ORAL_TABLET | ORAL | 0 refills | Status: DC

## 2019-12-19 MED ORDER — EZETIMIBE 10 MG PO TABS: ORAL_TABLET | ORAL | 0 refills | Status: DC

## 2019-12-19 NOTE — Telephone Encounter (Signed)
Please call pt to schedule appt. ABSOLUTELY no further refills until pt is seen.  [name], CMA

## 2019-12-19 NOTE — Telephone Encounter (Signed)
Patient called request refill on several medication :   amLODipine (NORVASC) 5 MG tablet [[id number]]   Order Details Dose, Route, Frequency: As Directed  Dispense Quantity: 90 tablet Refills: 0       Sig: TAKE ONE TABLET BY MOUTH DAILY.   &   ezetimibe (ZETIA) 10 MG tablet [[id number]]   Order Details Dose, Route, Frequency: As Directed  Dispense Quantity: 90 tablet Refills: 0       Sig: TAKE 1 TABLET BY MOUTH DAILY.   Forwarding request to med asst that if approved send refill order to :   [facility] [place], [place]  [place], [place] [address]  Phone:  [phone] Fax:  [phone]  --Dion Body

## 2019-12-22 ENCOUNTER — Other Ambulatory Visit: Payer: Self-pay | Admitting: Physician Assistant

## 2019-12-22 MED ORDER — CYCLOBENZAPRINE HCL 10 MG PO TABS: ORAL_TABLET | ORAL | 0 refills | Status: DC

## 2019-12-25 ENCOUNTER — Other Ambulatory Visit: Payer: Self-pay | Admitting: Family Medicine

## 2019-12-29 ENCOUNTER — Other Ambulatory Visit: Payer: Self-pay | Admitting: Family Medicine

## 2019-12-29 MED ORDER — LISINOPRIL 40 MG PO TABS: ORAL_TABLET | ORAL | 0 refills | Status: DC

## 2019-12-29 MED ORDER — ALLOPURINOL 300 MG PO TABS: ORAL_TABLET | ORAL | 0 refills | Status: DC

## 2020-01-01 ENCOUNTER — Other Ambulatory Visit: Payer: Self-pay | Admitting: Physician Assistant

## 2020-01-01 MED ORDER — EMPAGLIFLOZIN 25 MG PO TABS: 25.0000 mg | ORAL_TABLET | Freq: Every day | ORAL | 0 refills | Status: DC

## 2020-01-19 ENCOUNTER — Other Ambulatory Visit: Payer: Self-pay | Admitting: Family Medicine

## 2020-01-22 ENCOUNTER — Other Ambulatory Visit: Payer: Self-pay

## 2020-01-31 ENCOUNTER — Other Ambulatory Visit: Payer: Self-pay | Admitting: Adult Health

## 2020-02-04 ENCOUNTER — Other Ambulatory Visit: Payer: Self-pay | Admitting: Physician Assistant

## 2020-02-15 ENCOUNTER — Other Ambulatory Visit: Payer: Self-pay | Admitting: Physician Assistant

## 2020-02-15 DIAGNOSIS — I10 Essential (primary) hypertension: Secondary | ICD-10-CM

## 2020-02-16 ENCOUNTER — Other Ambulatory Visit: Payer: Self-pay | Admitting: Physician Assistant

## 2020-02-23 ENCOUNTER — Ambulatory Visit: Payer: BC Managed Care – PPO | Admitting: Physician Assistant

## 2020-03-15 ENCOUNTER — Other Ambulatory Visit: Payer: Self-pay | Admitting: Family Medicine

## 2020-03-15 ENCOUNTER — Other Ambulatory Visit: Payer: Self-pay | Admitting: Physician Assistant

## 2020-03-15 DIAGNOSIS — I10 Essential (primary) hypertension: Secondary | ICD-10-CM

## 2020-03-16 ENCOUNTER — Telehealth: Payer: Self-pay | Admitting: Physician Assistant

## 2020-03-16 NOTE — Telephone Encounter (Signed)
Patient last seen 09/11/19 and advised to follow up in 6/8 weeks.   Please contact patient to schedule for future refills. AS, CMA

## 2020-03-17 ENCOUNTER — Telehealth: Payer: Self-pay | Admitting: Physician Assistant

## 2020-03-17 DIAGNOSIS — I10 Essential (primary) hypertension: Secondary | ICD-10-CM

## 2020-03-17 MED ORDER — EZETIMIBE 10 MG PO TABS: ORAL_TABLET | ORAL | 0 refills | Status: DC

## 2020-03-17 MED ORDER — AMLODIPINE BESYLATE 5 MG PO TABS: 5.0000 mg | ORAL_TABLET | Freq: Every day | ORAL | 0 refills | Status: DC

## 2020-03-17 NOTE — Addendum Note (Signed)
Addended by: Mickel Crow on: 03/17/2020 01:09 PM   Modules accepted: Orders

## 2020-03-17 NOTE — Telephone Encounter (Signed)
Patient needed to r/s his med f/u appt. He just got a 15 day supply of his Zetia and amlodipine but our next available was not until Oct so he will be out of these meds again by then. He also needs a refill of his Lipitor to get him to this appt too. If approved please send to [facility] [address]

## 2020-03-17 NOTE — Telephone Encounter (Signed)
#  30 day supply sent to pharmacy. No further refills until patient seen. AS, CMA

## 2020-03-18 ENCOUNTER — Ambulatory Visit: Payer: BC Managed Care – PPO | Admitting: Physician Assistant

## 2020-03-26 ENCOUNTER — Other Ambulatory Visit: Payer: Self-pay | Admitting: Physician Assistant

## 2020-04-13 ENCOUNTER — Other Ambulatory Visit: Payer: Self-pay

## 2020-04-13 ENCOUNTER — Encounter: Payer: Self-pay | Admitting: Physician Assistant

## 2020-04-13 ENCOUNTER — Ambulatory Visit (INDEPENDENT_AMBULATORY_CARE_PROVIDER_SITE_OTHER): Payer: BC Managed Care – PPO | Admitting: Physician Assistant

## 2020-04-13 VITALS — BP 128/71 | HR 100 | Ht 73.0 in | Wt 324.2 lb

## 2020-04-13 DIAGNOSIS — E782 Mixed hyperlipidemia: Secondary | ICD-10-CM

## 2020-04-13 DIAGNOSIS — E1159 Type 2 diabetes mellitus with other circulatory complications: Secondary | ICD-10-CM

## 2020-04-13 DIAGNOSIS — R5383 Other fatigue: Secondary | ICD-10-CM | POA: Diagnosis not present

## 2020-04-13 DIAGNOSIS — R4586 Emotional lability: Secondary | ICD-10-CM

## 2020-04-13 DIAGNOSIS — Z8349 Family history of other endocrine, nutritional and metabolic diseases: Secondary | ICD-10-CM | POA: Diagnosis not present

## 2020-04-13 DIAGNOSIS — E1169 Type 2 diabetes mellitus with other specified complication: Secondary | ICD-10-CM

## 2020-04-13 DIAGNOSIS — I152 Hypertension secondary to endocrine disorders: Secondary | ICD-10-CM

## 2020-04-13 LAB — POCT GLYCOSYLATED HEMOGLOBIN (HGB A1C): Hemoglobin A1C: 6.8 % — AB (ref 4.0–5.6)

## 2020-04-13 MED ORDER — EZETIMIBE 10 MG PO TABS: 10.0000 mg | ORAL_TABLET | Freq: Every day | ORAL | 0 refills | Status: DC

## 2020-04-13 MED ORDER — ROSUVASTATIN CALCIUM 5 MG PO TABS: 5.0000 mg | ORAL_TABLET | Freq: Every day | ORAL | 0 refills | Status: DC

## 2020-04-13 MED ORDER — AMLODIPINE BESYLATE 5 MG PO TABS: 5.0000 mg | ORAL_TABLET | Freq: Every day | ORAL | 0 refills | Status: DC

## 2020-04-13 NOTE — Progress Notes (Signed)
Established Patient Office Visit  Subjective:  Patient ID: Eddie Hancock, male    DOB: [birth year]  Age: 49 y.o. MRN: [id number]  CC:  Chief Complaint  Patient presents with  . Hypertension  . Hyperlipidemia  . Diabetes    HPI ALTIN SEASE presents for hypertension, hyperlipidemia, and diabetes mellitus.  HTN: Pt denies chest pain, palpitations, dizziness or lower swelling. Taking medication as directed without side effects. Checks BP at home but does not think his BP machine is working correctly because his BP is usually high in the 150s/90s. Pt follows a low salt diet. Verbalizes he would like to be more active and return to the gym.  HLD: Pt taking medication as directed without issues. Reports his insurance was no longer going to cover Lovaza so he self-discontinued. States he was having severe body-aches which seemed to improve after stopping medication. But continues to have myalgias for which he takes Ibuprofen on a daily basis. Continues to take Lipitor and Zetia.   Diabetes: Pt denies increased urination or thirst. Pt reports medication compliance. No hypoglycemic events. Checking glucose at home. FBS range 130s. States he has altered his eating schedule and follows a low carbohydrate diet.  Mood: States he lacks motivation and feels tired. Reports a family history of thyroid disorder and wonders if it could be thyroid-related or depression. Denies SI/HI.   Past Medical History:  Diagnosis Date  . Diabetes mellitus without complication ([place])   . GERD (gastroesophageal reflux disease)   . Hypertension   . Obesity     Past Surgical History:  Procedure Laterality Date  . APPENDECTOMY      Family History  Problem Relation Age of Onset  . Hypertension Mother   . Diabetes Daughter   . Learning disabilities Maternal Grandmother   . Learning disabilities Maternal Grandfather     Social History   Socioeconomic History  . Marital status: Single    Spouse name: Not on  file  . Number of children: Not on file  . Years of education: Not on file  . Highest education level: Not on file  Occupational History  . Not on file  Tobacco Use  . Smoking status: Former Smoker    Quit date: 11/07/2016    Years since quitting: 3.4  . Smokeless tobacco: Never Used  Substance and Sexual Activity  . Alcohol use: No  . Drug use: No  . Sexual activity: Yes    Birth control/protection: None  Other Topics Concern  . Not on file  Social History Narrative  . Not on file   Social Determinants of Health   Financial Resource Strain:   . Difficulty of Paying Living Expenses: Not on file  Food Insecurity:   . Worried About Charity fundraiser in the Last Year: Not on file  . Ran Out of Food in the Last Year: Not on file  Transportation Needs:   . Lack of Transportation (Medical): Not on file  . Lack of Transportation (Non-Medical): Not on file  Physical Activity:   . Days of Exercise per Week: Not on file  . Minutes of Exercise per Session: Not on file  Stress:   . Feeling of Stress : Not on file  Social Connections:   . Frequency of Communication with Friends and Family: Not on file  . Frequency of Social Gatherings with Friends and Family: Not on file  . Attends Religious Services: Not on file  . Active Member of Clubs or Organizations:  Not on file  . Attends Archivist Meetings: Not on file  . Marital Status: Not on file  Intimate Partner Violence:   . Fear of Current or Ex-Partner: Not on file  . Emotionally Abused: Not on file  . Physically Abused: Not on file  . Sexually Abused: Not on file    Outpatient Medications Prior to Visit  Medication Sig Dispense Refill  . allopurinol (ZYLOPRIM) 300 MG tablet TAKE 1 TABLET(300 MG) BY MOUTH DAILY 90 tablet 0  . Blood Glucose Monitoring Suppl (CONTOUR NEXT ONE) KIT 1 each by Does not apply route 2 (two) times daily. Check fasting blood sugar in morning and then recheck blood sugar after largest meal. 1  kit 0  . cyclobenzaprine (FLEXERIL) 10 MG tablet TAKE 1 TABLET DAILY AS NEEDED FOR MUSCLE SPASMS**PATIENT NEEDS APT FOR FURTHER REFILLS** 60 tablet 0  . famotidine (PEPCID) 20 MG tablet Take 20 mg by mouth daily.    Marland Kitchen glucose blood test strip Use to check blood sugars three times daily 300 each 4  . Insulin Pen Needle (PEN NEEDLES 31GX5/16") 31G X 8 MM MISC 1 each by Does not apply route daily. Use one needle daily with Victoza injection 100 each 0  . JARDIANCE 25 MG TABS tablet TAKE 1 TABLET(25 MG) BY MOUTH DAILY BEFORE BREAKFAST 90 tablet 0  . lisinopril (ZESTRIL) 40 MG tablet TAKE 1 TABLET(40 MG) BY MOUTH DAILY 90 tablet 0  . meloxicam (MOBIC) 7.5 MG tablet TAKE 1 TABLET(7.5 MG) BY MOUTH TWICE DAILY WITH A MEAL 30 tablet 2  . metFORMIN (GLUCOPHAGE) 1000 MG tablet TAKE 1 TABLET(1000 MG) BY MOUTH TWICE DAILY WITH A MEAL 60 tablet 0  . Multiple Vitamin (MULTIVITAMIN) tablet Take 1 tablet by mouth daily.    Marland Kitchen omega-3 acid ethyl esters (LOVAZA) 1 g capsule Take 1 capsule (1 g total) by mouth 2 (two) times daily. 180 capsule 0  . Turmeric (QC TUMERIC COMPLEX PO) Take by mouth.    Marland Kitchen amLODipine (NORVASC) 5 MG tablet Take 1 tablet (5 mg total) by mouth daily. TAKE 1 TABLET BY MOUTH DAILY**NO FURTHER REFILLS UNTIL PATIENT SEEN** 30 tablet 0  . atorvastatin (LIPITOR) 10 MG tablet TAKE ONE-HALF OF TABLET BY MOUTH EVERY NIGHT 90 tablet 0  . ezetimibe (ZETIA) 10 MG tablet TAKE 1 TABLET BY MOUTH DAILY. **NO FURTHER REFILLS UNTIL PATIENT SEEN** 30 tablet 0   No facility-administered medications prior to visit.    Allergies  Allergen Reactions  . Cetirizine Shortness Of Breath  . Liraglutide Other (See Comments)    Severe pancreatitis  . Onglyza [Saxagliptin] Other (See Comments)    Potential side effect of pancreatitis    ROS Review of Systems A fourteen system review of systems was performed and found to be positive as per HPI.  Objective:    Physical Exam General:  Well Developed, well  nourished, in no acute distress Neuro:  Alert and oriented,  extra-ocular muscles intact  HEENT:  Normocephalic, atraumatic, neck supple Skin:  no gross rash, warm, pink. Cardiac:  RRR, S1 S2 Respiratory:  ECTA B/L and A/P, Not using accessory muscles, speaking in full sentences- unlabored. Vascular:  Ext warm, no cyanosis apprec.; cap RF less 2 sec. Psych:  No HI/SI, judgement and insight good, Normal mood. Normal behavior   BP 128/71   Pulse 100   Ht _0  (1.854 m)   Wt (!) 324 lb 3.2 oz (147.1 kg)   SpO2 98%   BMI 42.77 kg/m  Wt Readings from Last 3 Encounters:  04/13/20 (!) 324 lb 3.2 oz (147.1 kg)  09/11/19 (!) 332 lb (150.6 kg)  07/18/19 (!) 327 lb 8 oz (148.6 kg)     Health Maintenance Due  Topic Date Due  . Hepatitis C Screening  Never done  . COVID-19 Vaccine (1) Never done  . HIV Screening  Never done  . OPHTHALMOLOGY EXAM  09/03/2018  . FOOT EXAM  07/16/2019  . INFLUENZA VACCINE  02/08/2020    There are no preventive care reminders to display for this patient.  Lab Results  Component Value Date   TSH 2.590 11/11/2018   Lab Results  Component Value Date   WBC 11.3 (H) 07/18/2019   HGB 16.8 07/18/2019   HCT 47.4 07/18/2019   MCV 85.6 07/18/2019   PLT 238 07/18/2019   Lab Results  Component Value Date   NA 139 09/15/2019   K 5.0 09/15/2019   CO2 22 09/15/2019   GLUCOSE 236 (H) 09/15/2019   BUN 11 09/15/2019   CREATININE 1.04 09/15/2019   BILITOT 0.8 09/15/2019   ALKPHOS 67 09/15/2019   AST 27 09/15/2019   ALT 57 (H) 09/15/2019   PROT 6.1 09/15/2019   ALBUMIN 4.5 09/15/2019   CALCIUM 9.1 09/15/2019   ANIONGAP 11 07/18/2019   Lab Results  Component Value Date   CHOL 97 (L) 11/11/2018   Lab Results  Component Value Date   HDL 37 (L) 11/11/2018   Lab Results  Component Value Date   LDLCALC 29 11/11/2018   Lab Results  Component Value Date   TRIG 155 (H) 11/11/2018   Lab Results  Component Value Date   CHOLHDL 2.6 11/11/2018    Lab Results  Component Value Date   HGBA1C 6.8 (A) 04/13/2020      Assessment & Plan:   Problem List Items Addressed This Visit      Cardiovascular and Mediastinum   Hypertension associated with diabetes ([place])   Relevant Medications   ezetimibe (ZETIA) 10 MG tablet   amLODipine (NORVASC) 5 MG tablet   rosuvastatin (CRESTOR) 5 MG tablet     Endocrine   Diabetes mellitus without complication ([facility]) - Primary   Relevant Medications   rosuvastatin (CRESTOR) 5 MG tablet   Mixed diabetic hyperlipidemia associated with type 2 diabetes mellitus ([facility])   Relevant Medications   ezetimibe (ZETIA) 10 MG tablet   amLODipine (NORVASC) 5 MG tablet   rosuvastatin (CRESTOR) 5 MG tablet    Other Visit Diagnoses    Fatigue, unspecified type       Family history of thyroid disease       Mood changes         Hypertension associated with diabetes: -BP in office stable, discussed with patient bringing his BP device so we can compare with ours. -Continue current medication regimen. Provided refill. -Continue low sodium diet. -Advised patient to schedule lab visit for FBW including CMP.  Controlled Type 2 Diabetes Mellitus with other specified complication without long-term current use of insulin: -A1c significantly improved and at goal -Continue current medication regimen. -Follow a low carbohydrate and glucose diet. -Stay as active as possible.  Mixed diabetic hyperlipidemia associated with type 2 diabetes mellitus: -Last lipid panel: total cholesterol mildly low, triglycerides elevated and HDL low, LDL at goal (29 mg/dL) -Discussed with patient changing to low dose of Crestor to see if myalgias are possibly a side effect from Lipitor. Patient is agreeable. Discussed with patient daily use of NSAIDs can cause  renal damage and recommend to reduce use. Recommend to apply topical anti-inflammatory such as Voltaren gel. -Continue Zetia. -Follow a heart healthy diet low in saturated and trans  fats. -Will continue to monitor.  Fatigue, Family history of thyroid disease, Mood changes: -Discussed with patient obtaining blood work to evaluate for possible etiologies including thyroid disease. Patient will schedule lab visit. -If blood work reveals no clear etiology then recommend starting antidepressant medication (PHQ-9 score of 9). Discussed with patient various treatment options and is agreeable to starting Zoloft 64m.  -Recommend to increase physical activity and establish a good sleep hygiene.    Meds ordered this encounter  Medications  . ezetimibe (ZETIA) 10 MG tablet    Sig: Take 1 tablet (10 mg total) by mouth daily.    Dispense:  90 tablet    Refill:  0  . amLODipine (NORVASC) 5 MG tablet    Sig: Take 1 tablet (5 mg total) by mouth daily.    Dispense:  90 tablet    Refill:  0  . rosuvastatin (CRESTOR) 5 MG tablet    Sig: Take 1 tablet (5 mg total) by mouth daily.    Dispense:  90 tablet    Refill:  0    Follow-up: Return in about 4 months (around 08/14/2020) for DM, HLD, Wt; lab visit FBW and Vit D (not A1c) next 1-2 weeks.   Note:  This note was prepared with assistance of Dragon voice recognition software. Occasional wrong-word or sound-a-like substitutions may have occurred due to the inherent limitations of voice recognition software.   MLorrene Reid PA-C

## 2020-04-13 NOTE — Patient Instructions (Signed)

## 2020-04-15 ENCOUNTER — Other Ambulatory Visit: Payer: Self-pay | Admitting: Physician Assistant

## 2020-04-15 DIAGNOSIS — E1169 Type 2 diabetes mellitus with other specified complication: Secondary | ICD-10-CM

## 2020-04-15 DIAGNOSIS — I10 Essential (primary) hypertension: Secondary | ICD-10-CM

## 2020-04-15 DIAGNOSIS — E119 Type 2 diabetes mellitus without complications: Secondary | ICD-10-CM

## 2020-04-15 DIAGNOSIS — Z0000 Encounter for general adult medical examination without abnormal findings: Secondary | ICD-10-CM

## 2020-04-15 DIAGNOSIS — E785 Hyperlipidemia, unspecified: Secondary | ICD-10-CM

## 2020-04-19 ENCOUNTER — Other Ambulatory Visit: Payer: Self-pay

## 2020-04-19 ENCOUNTER — Other Ambulatory Visit: Payer: BC Managed Care – PPO

## 2020-04-19 DIAGNOSIS — E785 Hyperlipidemia, unspecified: Secondary | ICD-10-CM | POA: Diagnosis not present

## 2020-04-19 DIAGNOSIS — I10 Essential (primary) hypertension: Secondary | ICD-10-CM

## 2020-04-19 DIAGNOSIS — E119 Type 2 diabetes mellitus without complications: Secondary | ICD-10-CM

## 2020-04-19 DIAGNOSIS — Z0000 Encounter for general adult medical examination without abnormal findings: Secondary | ICD-10-CM

## 2020-04-19 DIAGNOSIS — E1169 Type 2 diabetes mellitus with other specified complication: Secondary | ICD-10-CM

## 2020-04-19 IMAGING — US US ABDOMEN LIMITED
1 series · 14 of 25 positions shown · non-contrast
Comparison: None.

CLINICAL DATA: Right upper quadrant pain

EXAM:
ULTRASOUND ABDOMEN LIMITED RIGHT UPPER QUADRANT

[Series 1: us abdomen limited · 14 of 28 slices shown]
[im 1/28]
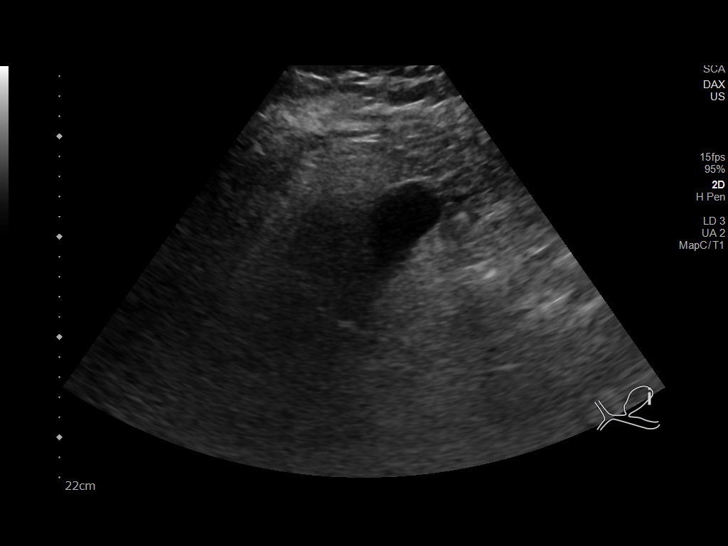
[im 3/28]
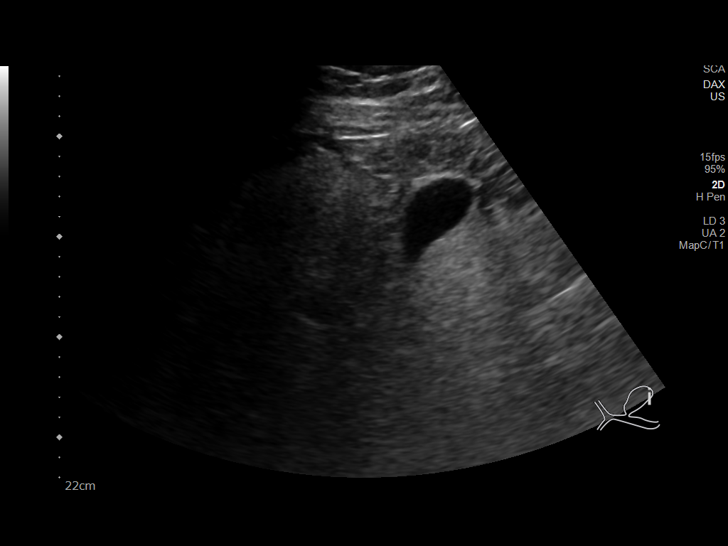
[im 5/28]
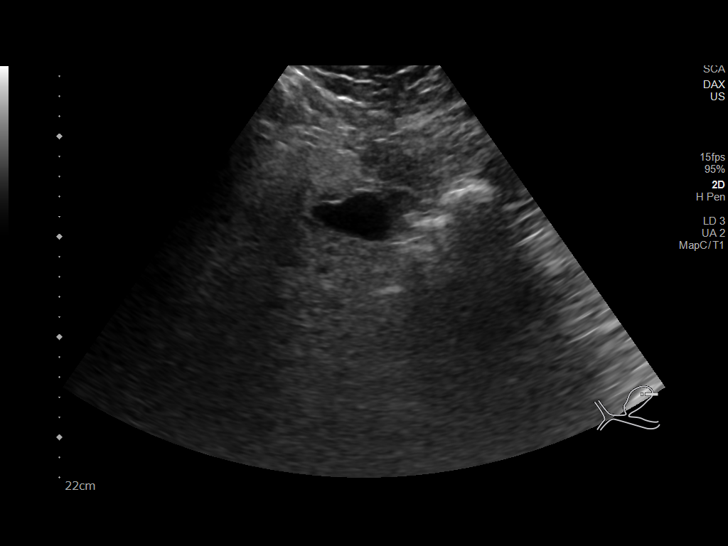
[im 7/28]
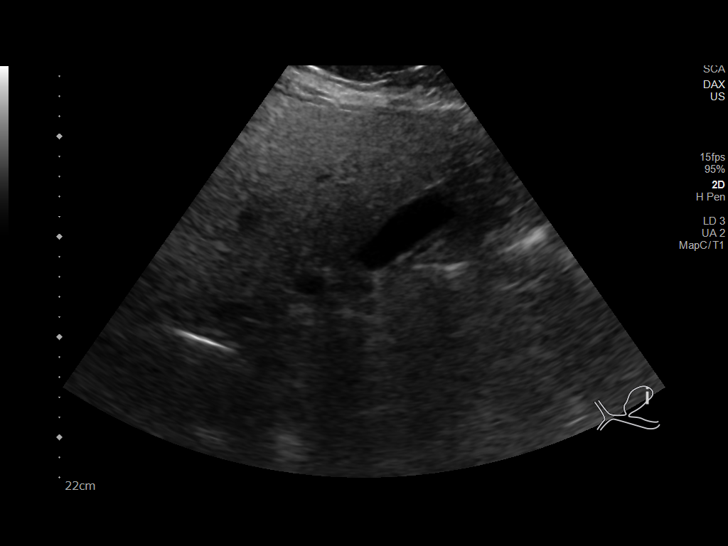
[im 10/28]
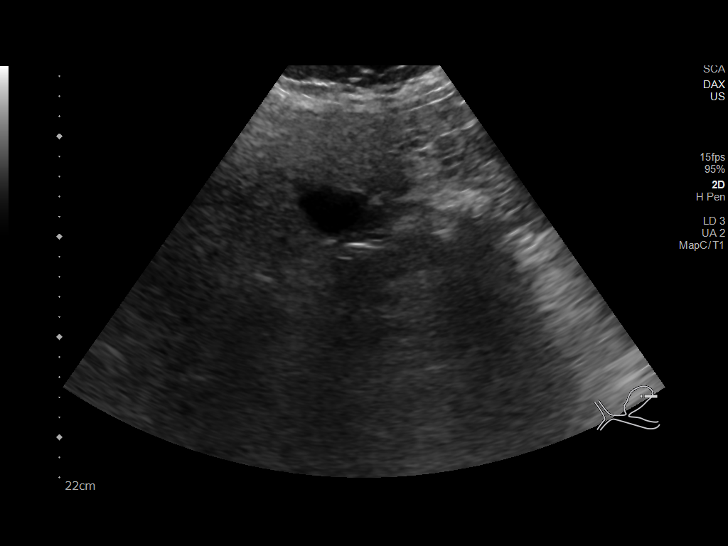
[im 11/28]
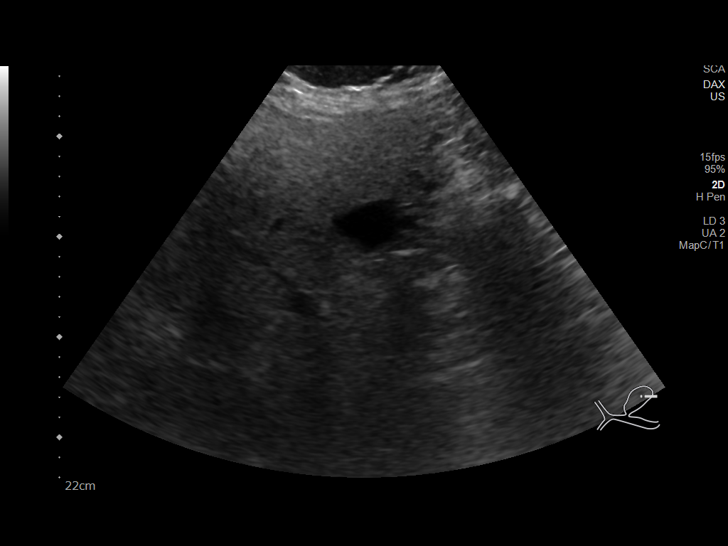
[im 13/28]
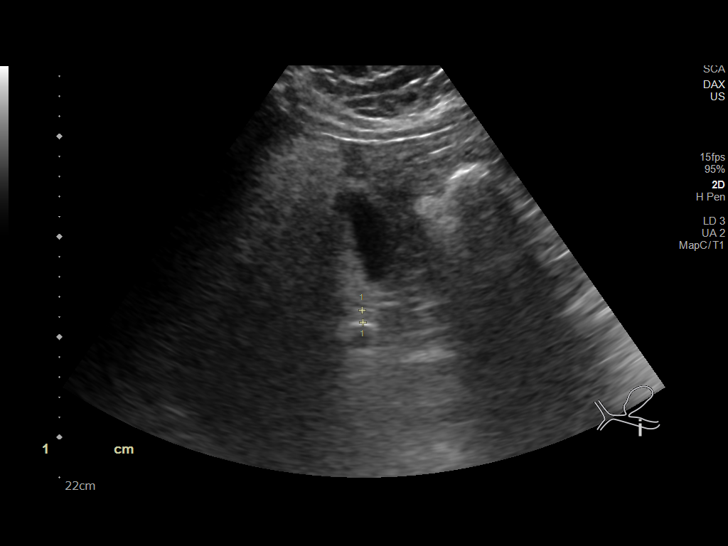
[im 15/28]
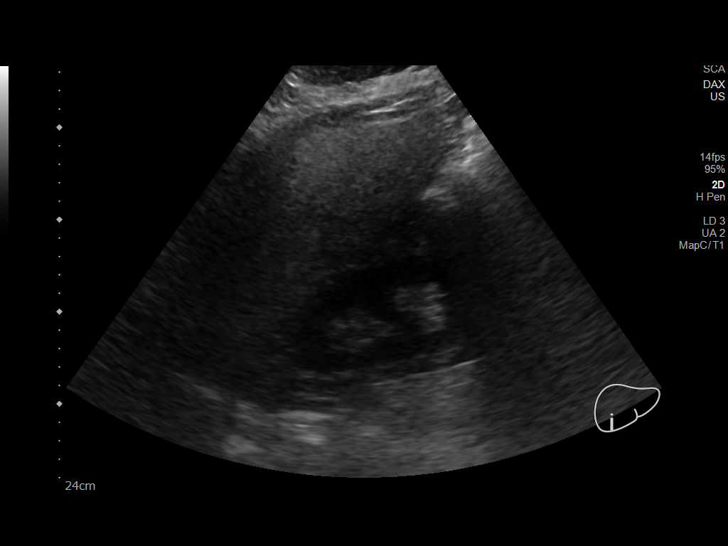
[im 17/28]
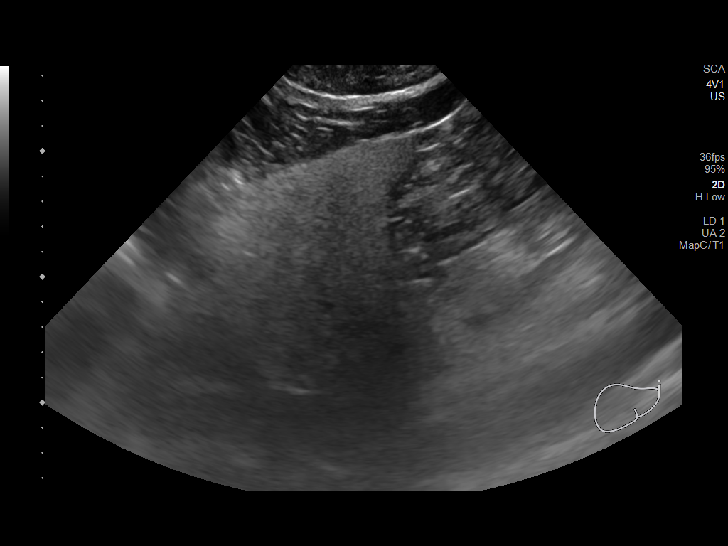
[im 19/28]
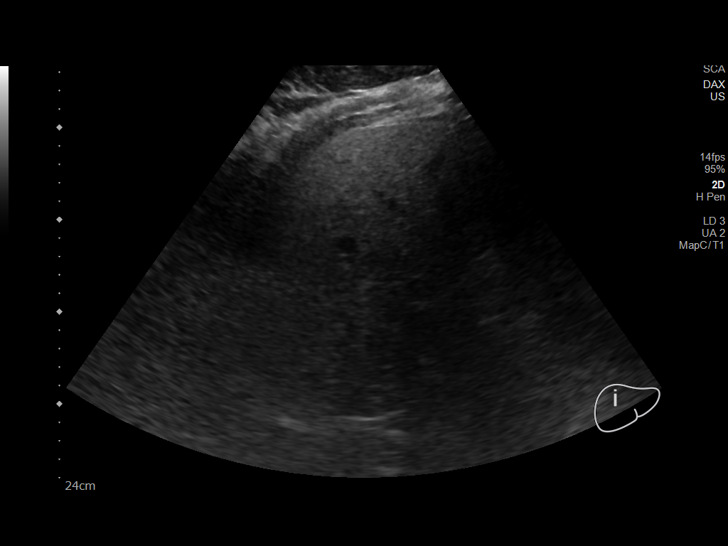
[im 21/28]
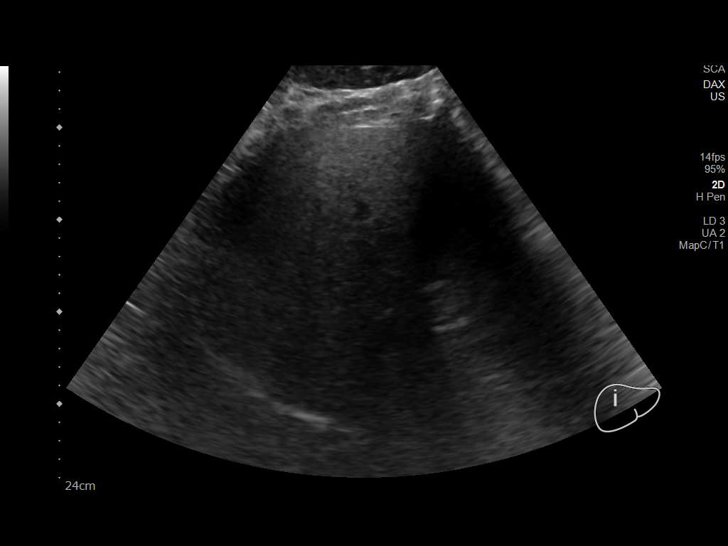
[im 23/28]
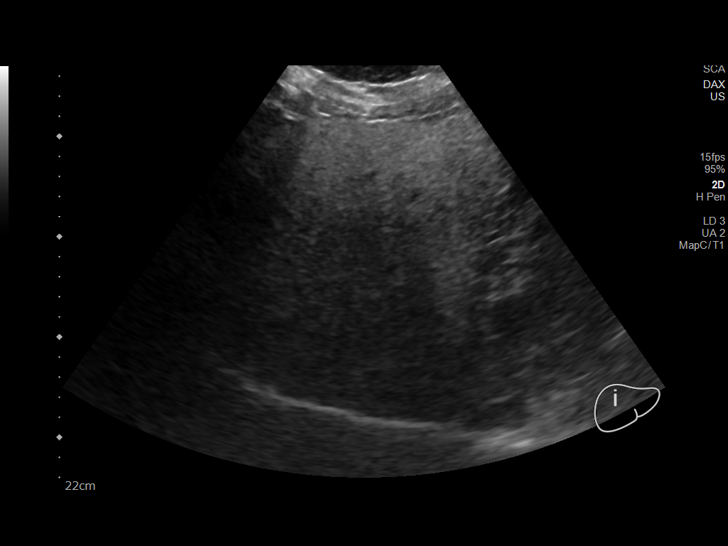
[im 25/28]
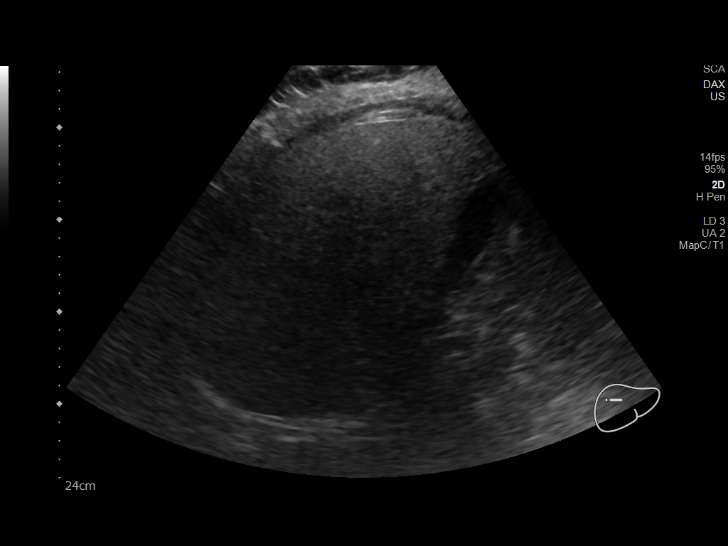
[im 28/28]
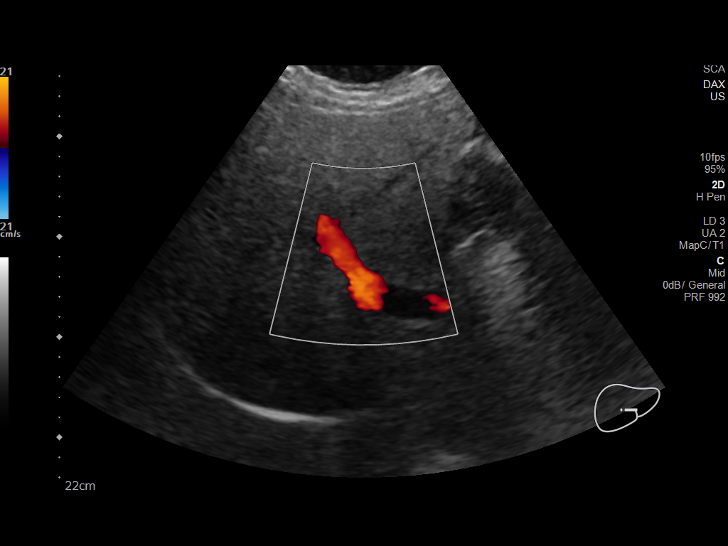

[14 of 25 positions shown; findings below may reference images not displayed]

FINDINGS: Gallbladder:

No gallstones or wall thickening visualized. No sonographic Murphy
sign noted by sonographer.

Common bile duct:

Diameter: Normal caliber, 6 mm

Liver:

Increased echotexture compatible with fatty infiltration. No focal
abnormality or biliary ductal dilatation. Portal vein is patent on
color Doppler imaging with normal direction of blood flow towards
the liver.

Other: None.
IMPRESSION: Fatty infiltration of the liver.

No acute findings.

## 2020-04-20 LAB — COMPREHENSIVE METABOLIC PANEL
ALT: 43 IU/L (ref 0–44)
AST: 22 IU/L (ref 0–40)
Albumin/Globulin Ratio: 2.2 (ref 1.2–2.2)
Albumin: 4.6 g/dL (ref 4.0–5.0)
Alkaline Phosphatase: 68 IU/L (ref 44–121)
BUN/Creatinine Ratio: 11 (ref 9–20)
BUN: 14 mg/dL (ref 6–24)
Bilirubin Total: 0.8 mg/dL (ref 0.0–1.2)
CO2: 22 mmol/L (ref 20–29)
Calcium: 9.5 mg/dL (ref 8.7–10.2)
Chloride: 98 mmol/L (ref 96–106)
Creatinine, Ser: 1.24 mg/dL (ref 0.76–1.27)
GFR calc Af Amer: 79 mL/min/{1.73_m2} (ref >59)
GFR calc non Af Amer: 68 mL/min/{1.73_m2} (ref >59)
Globulin, Total: 2.1 g/dL (ref 1.5–4.5)
Glucose: 162 mg/dL — ABNORMAL HIGH (ref 65–99)
Potassium: 4.9 mmol/L (ref 3.5–5.2)
Sodium: 136 mmol/L (ref 134–144)
Total Protein: 6.7 g/dL (ref 6.0–8.5)

## 2020-04-20 LAB — CBC
Hematocrit: 48.1 % (ref 37.5–51.0)
Hemoglobin: 16.8 g/dL (ref 13.0–17.7)
MCH: 31.1 pg (ref 26.6–33.0)
MCHC: 34.9 g/dL (ref 31.5–35.7)
MCV: 89 fL (ref 79–97)
Platelets: 251 10*3/uL (ref 150–450)
RBC: 5.41 x10E6/uL (ref 4.14–5.80)
RDW: 13.5 % (ref 11.6–15.4)
WBC: 9 10*3/uL (ref 3.4–10.8)

## 2020-04-20 LAB — LIPID PANEL
Chol/HDL Ratio: 4.1 ratio (ref 0.0–5.0)
Cholesterol, Total: 130 mg/dL (ref 100–199)
HDL: 32 mg/dL — ABNORMAL LOW (ref >39)
LDL Chol Calc (NIH): 42 mg/dL (ref 0–99)
Triglycerides: 383 mg/dL — ABNORMAL HIGH (ref 0–149)
VLDL Cholesterol Cal: 56 mg/dL — ABNORMAL HIGH (ref 5–40)

## 2020-04-20 LAB — TSH: TSH: 2.87 u[IU]/mL (ref 0.450–4.500)

## 2020-04-20 LAB — VITAMIN D 25 HYDROXY (VIT D DEFICIENCY, FRACTURES): Vit D, 25-Hydroxy: 37.4 ng/mL (ref 30.0–100.0)

## 2020-04-21 ENCOUNTER — Telehealth: Payer: Self-pay | Admitting: Physician Assistant

## 2020-04-21 DIAGNOSIS — R5383 Other fatigue: Secondary | ICD-10-CM

## 2020-04-21 MED ORDER — SERTRALINE HCL 25 MG PO TABS: 25.0000 mg | ORAL_TABLET | Freq: Every day | ORAL | 0 refills | Status: DC

## 2020-04-21 NOTE — Telephone Encounter (Signed)
Patient is aware of lab results and verbalized understanding. Patient is agreeable to Zoloft rx which was sent to pharmacy.   Future telehealth apt for mood set up in 8 wks. AS, CMA

## 2020-04-21 NOTE — Telephone Encounter (Signed)
-----   Message from Lorrene Reid, [place] sent at 04/21/2020  8:13 AM EDT ----- Please call Eddie Hancock and notify of lab results. Vitamin D, CBC and TSH are within normal limits. CMP shows fasting glucose is elevated. Lipid panel- triglycerides are elevated at 383 and HDL low at 32. Recommend to reduce simple carbohydrates and increase physical activity. [place] discussed with patient starting Zoloft 25 mg if labs revealed no clear cause for fatigue and lack of motivation symptoms. If patient still agreeable, will send rx and recommend to follow up in 8 weeks for mood management which can be a telehealth appointment if needed. Continue current medication regimen.  Thank you, Herb Grays

## 2020-05-09 ENCOUNTER — Other Ambulatory Visit: Payer: Self-pay | Admitting: Physician Assistant

## 2020-05-10 NOTE — Telephone Encounter (Signed)
Patient has chronic back pain with occasional flare-ups, ok to provide refill.   Thank you, Herb Grays

## 2020-05-10 NOTE — Telephone Encounter (Signed)
Patient was here in October and I do not see where Flexaril was discussed. Do you want patient to continue to take? Please advise. AS, CMA

## 2020-06-13 ENCOUNTER — Other Ambulatory Visit: Payer: Self-pay | Admitting: Adult Health

## 2020-06-13 DIAGNOSIS — E1169 Type 2 diabetes mellitus with other specified complication: Secondary | ICD-10-CM

## 2020-06-13 DIAGNOSIS — E782 Mixed hyperlipidemia: Secondary | ICD-10-CM

## 2020-06-14 ENCOUNTER — Ambulatory Visit (INDEPENDENT_AMBULATORY_CARE_PROVIDER_SITE_OTHER): Payer: BC Managed Care – PPO | Admitting: Physician Assistant

## 2020-06-14 ENCOUNTER — Other Ambulatory Visit: Payer: Self-pay

## 2020-06-14 ENCOUNTER — Encounter: Payer: Self-pay | Admitting: Physician Assistant

## 2020-06-14 VITALS — Ht 73.0 in | Wt 316.0 lb

## 2020-06-14 DIAGNOSIS — M79673 Pain in unspecified foot: Secondary | ICD-10-CM | POA: Diagnosis not present

## 2020-06-14 DIAGNOSIS — F320 Major depressive disorder, single episode, mild: Secondary | ICD-10-CM | POA: Diagnosis not present

## 2020-06-14 MED ORDER — SERTRALINE HCL 50 MG PO TABS: 50.0000 mg | ORAL_TABLET | Freq: Every day | ORAL | 2 refills | Status: DC

## 2020-06-14 NOTE — Progress Notes (Signed)
Telehealth office visit note for Eddie Reid, PA-C- at [facility] at [facility]   I connected with current patient today by telephone and verified that I am speaking with the correct person   . Location of the patient: Home . Location of the provider: Office - This visit type was conducted due to national recommendations for restrictions regarding the COVID-19 Pandemic (e.g. social distancing) in an effort to limit this patient's exposure and mitigate transmission in our community.    - No physical exam could be performed with this format, beyond that communicated to [place] by the patient/ family members as noted.   - Additionally my office staff/ schedulers were to discuss with the patient that there may be a monetary charge related to this service, depending on their medical insurance.  My understanding is that patient understood and consented to proceed.     _________________________________________________________________________________   History of Present Illness: Patient calls in to follow up on mood management. Patient was started on Sertraline 25 mg. States wife has noticed some positive changes with his behavior. However, he continues to feel unmotivated and uninterested. Taking medication as directed. States has noticed "little headaches" which usually subside after taking Tylenol or Ibuprofen. Also complains of heel pain related to wearing safety shoes. States his job recently required the use of safety shoes so he had to get a bigger shoe size to avoid rubbing his toes and prevent getting ulcers. Has tried different brands and has found some improvement with wearing steel toe tennis shoes with insert for support. Is concerned about not being able to wear safety shoes if heel pain fails to resolve or worsen.      No flowsheet data found.  Depression screen [facility] 2/9 06/14/2020 04/13/2020 04/21/2019 10/28/2018 07/15/2018  Decreased Interest 1 2 0 0 0  Down, Depressed, Hopeless  1 2 0 0 0  PHQ - 2 Score 2 4 0 0 0  Altered sleeping 0 1 2 3 2   Tired, decreased energy 1 3 2  0 1  Change in appetite 0 0 0 0 0  Feeling bad or failure about yourself  0 1 0 0 0  Trouble concentrating 1 0 0 0 0  Moving slowly or fidgety/restless 0 0 0 0 0  Suicidal thoughts 0 0 0 0 0  PHQ-9 Score 4 9 4 3 3   Difficult doing work/chores Somewhat difficult - Not difficult at all Not difficult at all Not difficult at all      Impression and Recommendations:     1. Depression, major, single episode, mild ([place])   2. Heel pain, unspecified laterality     Depression, major, single episode, mild: -PHQ-9 score of 4, improved from 9. Denies SI/HI. -Discussed with patient increasing Sertraline to 50 mg. Patient is agreeable. Advised to let me know if unable to tolerate new dose. -Will continue to monitor. -Follow up in 12 weeks to reassess symptoms and medication therapy.   Heel pain: -Discussed with patient to continue with good support shoes. -Recommend gentle stretches and massage. -If symptoms fail to improve or worsen recommend podiatry referral and avoidance of provocative activities such as wearing safety shoes until symptoms improve/resolve.     - As part of my medical decision making, I reviewed the following data within the [place] History obtained from pt /family, CMA notes reviewed and incorporated if applicable, Labs reviewed, Radiograph/ tests reviewed if applicable and OV notes from prior OV's with me,  as well as any other specialists she/he has seen since seeing me last, were all reviewed and used in my medical decision making process today.    - Additionally, when appropriate, discussion had with patient regarding our treatment plan, and their biases/concerns about that plan were used in my medical decision making today.    - The patient agreed with the plan and demonstrated an understanding of the instructions.   No barriers to understanding were  identified.     - The patient was advised to call back or seek an in-person evaluation if the symptoms worsen or if the condition fails to improve as anticipated.   Return in about 3 months (around 09/12/2020) for Mood- inc med dose, DM, HTN.    No orders of the defined types were placed in this encounter.   Meds ordered this encounter  Medications  . sertraline (ZOLOFT) 50 MG tablet    Sig: Take 1 tablet (50 mg total) by mouth daily.    Dispense:  30 tablet    Refill:  2    Order Specific Question:   Supervising Provider    Answer:   Beatrice Lecher D [[id number]]    Medications Discontinued During This Encounter  Medication Reason  . famotidine (PEPCID) 20 MG tablet Patient Preference  . omega-3 acid ethyl esters (LOVAZA) 1 g capsule Not covered by the pt's insurance  . sertraline (ZOLOFT) 25 MG tablet Dose change       Time spent on visit including pre-visit chart review and post-visit care was 10 minutes.      The [place] was signed into law in 2016 which includes the topic of electronic health records.  This provides immediate access to information in MyChart.  This includes consultation notes, operative notes, office notes, lab results and pathology reports.  If you have any questions about what you read please let us know at your next visit or call us at the office.  We are right here with you.  Note:  This note was prepared with assistance of Dragon voice recognition software. Occasional wrong-word or sound-a-like substitutions may have occurred due to the inherent limitations of voice recognition software.  __________________________________________________________________________________     Patient Care Team    Relationship Specialty Notifications Start End  Eddie Hancock, [place] PCP - General Physician Assistant  12/19/19      -Vitals obtained; medications/ allergies reconciled;  personal medical, social, Sx etc.histories were updated by CMA,  reviewed by me and are reflected in chart   Patient Active Problem List   Diagnosis Date Noted  . Hypertension associated with diabetes ([place]) 09/11/2019  . Mixed diabetic hyperlipidemia associated with type 2 diabetes mellitus ([place]) 09/11/2019  . Acute pancreatitis 09/11/2019  . BMI 40.0-44.9, adult ([place]) 07/15/2018  . Morbid obesity ([place]) 07/15/2018  . Insomnia 11/29/2017  . Diabetes mellitus without complication ([place]) 37/48/2707  . Hyperlipidemia associated with type 2 diabetes mellitus ([place]) 06/21/2017  . Healthcare maintenance 06/21/2017  . Contact dermatitis 10/17/2016  . Benign essential hypertension 11/04/2015  . Chronic low back pain without sciatica 11/04/2015  . Hyperglycemia 11/04/2015  . Hypertriglyceridemia 11/04/2015  . Seasonal allergic rhinitis due to pollen 11/04/2015     Current Meds  Medication Sig  . allopurinol (ZYLOPRIM) 300 MG tablet TAKE 1 TABLET(300 MG) BY MOUTH DAILY  . amLODipine (NORVASC) 5 MG tablet Take 1 tablet (5 mg total) by mouth daily.  Marland Kitchen b complex vitamins capsule Take 1 capsule by mouth daily.  Marland Kitchen  Blood Glucose Monitoring Suppl (CONTOUR NEXT ONE) KIT 1 each by Does not apply route 2 (two) times daily. Check fasting blood sugar in morning and then recheck blood sugar after largest meal.  . Cholecalciferol (VITAMIN D3 PO) Take 500 mg by mouth daily.  . cyclobenzaprine (FLEXERIL) 10 MG tablet TAKE 1 TABLET BY MOUTH DAILY AS NEEDED FOR MUSCLE SPASMS  . ezetimibe (ZETIA) 10 MG tablet Take 1 tablet (10 mg total) by mouth daily.  Marland Kitchen glucose blood test strip Use to check blood sugars three times daily  . Insulin Pen Needle (PEN NEEDLES 31GX5/16") 31G X 8 MM MISC 1 each by Does not apply route daily. Use one needle daily with Victoza injection  . JARDIANCE 25 MG TABS tablet TAKE 1 TABLET(25 MG) BY MOUTH DAILY BEFORE BREAKFAST  . lisinopril (ZESTRIL) 40 MG tablet TAKE 1 TABLET(40 MG) BY MOUTH DAILY  . meloxicam (MOBIC) 7.5 MG tablet TAKE 1 TABLET(7.5 MG)  BY MOUTH TWICE DAILY WITH A MEAL  . metFORMIN (GLUCOPHAGE) 1000 MG tablet TAKE 1 TABLET(1000 MG) BY MOUTH TWICE DAILY WITH A MEAL  . Multiple Vitamin (MULTIVITAMIN) tablet Take 1 tablet by mouth daily.  . Probiotic Product (PROBIOTIC DAILY PO) Take 1 tablet by mouth daily.  . rosuvastatin (CRESTOR) 5 MG tablet Take 1 tablet (5 mg total) by mouth daily.  . Turmeric (QC TUMERIC COMPLEX PO) Take by mouth.  . [DISCONTINUED] sertraline (ZOLOFT) 25 MG tablet Take 1 tablet (25 mg total) by mouth daily.     Allergies:  Allergies  Allergen Reactions  . Cetirizine Shortness Of Breath  . Liraglutide Other (See Comments)    Severe pancreatitis  . Onglyza [Saxagliptin] Other (See Comments)    Potential side effect of pancreatitis     ROS:  See above HPI for pertinent positives and negatives   Objective:   Height 6' 1"  (1.854 m), weight (!) 316 lb (143.3 kg).  (if some vitals are omitted, this means that patient was UNABLE to obtain them.) General: A & O * 3; sounds in no acute distress Respiratory: speaking in full sentences, no conversational dyspnea Psych: insight appears good, mood- appears full

## 2020-06-18 ENCOUNTER — Other Ambulatory Visit: Payer: Self-pay | Admitting: Adult Health

## 2020-06-23 ENCOUNTER — Other Ambulatory Visit: Payer: Self-pay | Admitting: Physician Assistant

## 2020-06-24 ENCOUNTER — Other Ambulatory Visit: Payer: Self-pay | Admitting: Family Medicine

## 2020-06-26 ENCOUNTER — Other Ambulatory Visit: Payer: Self-pay | Admitting: Physician Assistant

## 2020-06-26 DIAGNOSIS — R5383 Other fatigue: Secondary | ICD-10-CM

## 2020-06-28 ENCOUNTER — Other Ambulatory Visit: Payer: Self-pay

## 2020-06-28 MED ORDER — METFORMIN HCL 1000 MG PO TABS
1000.0000 mg | ORAL_TABLET | Freq: Two times a day (BID) | ORAL | 0 refills | Status: DC
Start: 2020-06-28 — End: 2020-09-15

## 2020-07-01 ENCOUNTER — Other Ambulatory Visit: Payer: Self-pay | Admitting: Physician Assistant

## 2020-07-01 DIAGNOSIS — E1169 Type 2 diabetes mellitus with other specified complication: Secondary | ICD-10-CM

## 2020-07-11 ENCOUNTER — Other Ambulatory Visit: Payer: Self-pay | Admitting: Physician Assistant

## 2020-07-11 DIAGNOSIS — E1169 Type 2 diabetes mellitus with other specified complication: Secondary | ICD-10-CM

## 2020-07-11 DIAGNOSIS — E782 Mixed hyperlipidemia: Secondary | ICD-10-CM

## 2020-07-13 ENCOUNTER — Other Ambulatory Visit: Payer: Self-pay | Admitting: Physician Assistant

## 2020-07-13 DIAGNOSIS — I152 Hypertension secondary to endocrine disorders: Secondary | ICD-10-CM

## 2020-07-13 DIAGNOSIS — E1159 Type 2 diabetes mellitus with other circulatory complications: Secondary | ICD-10-CM

## 2020-07-29 DIAGNOSIS — U071 COVID-19: Secondary | ICD-10-CM | POA: Diagnosis not present

## 2020-07-29 DIAGNOSIS — Z20822 Contact with and (suspected) exposure to covid-19: Secondary | ICD-10-CM | POA: Diagnosis not present

## 2020-08-16 ENCOUNTER — Ambulatory Visit: Payer: BC Managed Care – PPO | Admitting: Physician Assistant

## 2020-08-19 ENCOUNTER — Other Ambulatory Visit: Payer: Self-pay | Admitting: Physician Assistant

## 2020-09-11 ENCOUNTER — Other Ambulatory Visit: Payer: Self-pay | Admitting: Physician Assistant

## 2020-09-11 DIAGNOSIS — F320 Major depressive disorder, single episode, mild: Secondary | ICD-10-CM

## 2020-09-15 ENCOUNTER — Other Ambulatory Visit: Payer: Self-pay | Admitting: Physician Assistant

## 2020-09-19 ENCOUNTER — Other Ambulatory Visit: Payer: Self-pay | Admitting: Physician Assistant

## 2020-09-27 ENCOUNTER — Ambulatory Visit: Payer: BC Managed Care – PPO | Admitting: Physician Assistant

## 2020-10-10 ENCOUNTER — Other Ambulatory Visit: Payer: Self-pay | Admitting: Physician Assistant

## 2020-10-10 DIAGNOSIS — E782 Mixed hyperlipidemia: Secondary | ICD-10-CM

## 2020-10-10 DIAGNOSIS — E1169 Type 2 diabetes mellitus with other specified complication: Secondary | ICD-10-CM

## 2020-10-11 ENCOUNTER — Telehealth: Payer: Self-pay | Admitting: Physician Assistant

## 2020-10-11 ENCOUNTER — Other Ambulatory Visit: Payer: Self-pay | Admitting: Physician Assistant

## 2020-10-11 DIAGNOSIS — E1169 Type 2 diabetes mellitus with other specified complication: Secondary | ICD-10-CM

## 2020-10-11 NOTE — Telephone Encounter (Signed)
Please contact patient to schedule per last AVS for further med refills. AS, CMA

## 2020-10-11 NOTE — Telephone Encounter (Signed)
Patient scheduled.

## 2020-10-25 ENCOUNTER — Ambulatory Visit (INDEPENDENT_AMBULATORY_CARE_PROVIDER_SITE_OTHER): Payer: BC Managed Care – PPO | Admitting: Physician Assistant

## 2020-10-25 ENCOUNTER — Encounter: Payer: Self-pay | Admitting: Physician Assistant

## 2020-10-25 ENCOUNTER — Other Ambulatory Visit: Payer: Self-pay

## 2020-10-25 VITALS — BP 131/86 | HR 104 | Temp 96.8°F | Ht 73.0 in | Wt 301.7 lb

## 2020-10-25 DIAGNOSIS — E1169 Type 2 diabetes mellitus with other specified complication: Secondary | ICD-10-CM

## 2020-10-25 DIAGNOSIS — F320 Major depressive disorder, single episode, mild: Secondary | ICD-10-CM

## 2020-10-25 DIAGNOSIS — L259 Unspecified contact dermatitis, unspecified cause: Secondary | ICD-10-CM | POA: Diagnosis not present

## 2020-10-25 DIAGNOSIS — E1159 Type 2 diabetes mellitus with other circulatory complications: Secondary | ICD-10-CM

## 2020-10-25 DIAGNOSIS — M542 Cervicalgia: Secondary | ICD-10-CM

## 2020-10-25 DIAGNOSIS — I152 Hypertension secondary to endocrine disorders: Secondary | ICD-10-CM

## 2020-10-25 DIAGNOSIS — E785 Hyperlipidemia, unspecified: Secondary | ICD-10-CM

## 2020-10-25 LAB — POCT UA - MICROALBUMIN
Albumin/Creatinine Ratio, Urine, POC: <30
Creatinine, POC: 300 mg/dL
Microalbumin Ur, POC: 10 mg/L

## 2020-10-25 LAB — POCT GLYCOSYLATED HEMOGLOBIN (HGB A1C): Hemoglobin A1C: 6.9 % — AB (ref 4.0–5.6)

## 2020-10-25 MED ORDER — TRIAMCINOLONE ACETONIDE 0.1 % EX CREA: 1.0000 "application " | TOPICAL_CREAM | Freq: Two times a day (BID) | CUTANEOUS | 0 refills | Status: DC

## 2020-10-25 NOTE — Patient Instructions (Signed)
Diabetes Mellitus and Foot Care Foot care is an important part of your health, especially when you have diabetes. Diabetes may cause you to have problems because of poor blood flow (circulation) to your feet and legs, which can cause your skin to:  Become thinner and drier.  Break more easily.  Heal more slowly.  Peel and crack. You may also have nerve damage (neuropathy) in your legs and feet, causing decreased feeling in them. This means that you may not notice minor injuries to your feet that could lead to more serious problems. Noticing and addressing any potential problems early is the best way to prevent future foot problems. How to care for your feet Foot hygiene  Wash your feet daily with warm water and mild soap. Do not use hot water. Then, pat your feet and the areas between your toes until they are completely dry. Do not soak your feet as this can dry your skin.  Trim your toenails straight across. Do not dig under them or around the cuticle. File the edges of your nails with an emery board or nail file.  Apply a moisturizing lotion or petroleum jelly to the skin on your feet and to dry, brittle toenails. Use lotion that does not contain alcohol and is unscented. Do not apply lotion between your toes.   Shoes and socks  Wear clean socks or stockings every day. Make sure they are not too tight. Do not wear knee-high stockings since they may decrease blood flow to your legs.  Wear shoes that fit properly and have enough cushioning. Always look in your shoes before you put them on to be sure there are no objects inside.  To break in new shoes, wear them for just a few hours a day. This prevents injuries on your feet. Wounds, scrapes, corns, and calluses  Check your feet daily for blisters, cuts, bruises, sores, and redness. If you cannot see the bottom of your feet, use a mirror or ask someone for help.  Do not cut corns or calluses or try to remove them with medicine.  If you  find a minor scrape, cut, or break in the skin on your feet, keep it and the skin around it clean and dry. You may clean these areas with mild soap and water. Do not clean the area with peroxide, alcohol, or iodine.  If you have a wound, scrape, corn, or callus on your foot, look at it several times a day to make sure it is healing and not infected. Check for: ? Redness, swelling, or pain. ? Fluid or blood. ? Warmth. ? Pus or a bad smell.   General tips  Do not cross your legs. This may decrease blood flow to your feet.  Do not use heating pads or hot water bottles on your feet. They may burn your skin. If you have lost feeling in your feet or legs, you may not know this is happening until it is too late.  Protect your feet from hot and cold by wearing shoes, such as at the beach or on hot pavement.  Schedule a complete foot exam at least once a year (annually) or more often if you have foot problems. Report any cuts, sores, or bruises to your health care provider immediately. Where to find more information  American Diabetes Association: www.diabetes.org  Association of Diabetes Care & Education Specialists: www.diabeteseducator.org Contact a health care provider if:  You have a medical condition that increases your risk of infection and   you have any cuts, sores, or bruises on your feet.  You have an injury that is not healing.  You have redness on your legs or feet.  You feel burning or tingling in your legs or feet.  You have pain or cramps in your legs and feet.  Your legs or feet are numb.  Your feet always feel cold.  You have pain around any toenails. Get help right away if:  You have a wound, scrape, corn, or callus on your foot and: ? You have pain, swelling, or redness that gets worse. ? You have fluid or blood coming from the wound, scrape, corn, or callus. ? Your wound, scrape, corn, or callus feels warm to the touch. ? You have pus or a bad smell coming from  the wound, scrape, corn, or callus. ? You have a fever. ? You have a red line going up your leg. Summary  Check your feet every day for blisters, cuts, bruises, sores, and redness.  Apply a moisturizing lotion or petroleum jelly to the skin on your feet and to dry, brittle toenails.  Wear shoes that fit properly and have enough cushioning.  If you have foot problems, report any cuts, sores, or bruises to your health care provider immediately.  Schedule a complete foot exam at least once a year (annually) or more often if you have foot problems. This information is not intended to replace advice given to you by your health care provider. Make sure you discuss any questions you have with your health care provider. Document Revised: 01/15/2020 Document Reviewed: 01/15/2020 Elsevier Patient Education  2021 Elsevier Inc.  

## 2020-10-25 NOTE — Progress Notes (Signed)
Established Patient Office Visit  Subjective:  Patient ID: Eddie Hancock, male    DOB: 07-03-1971  Age: 50 y.o. MRN: [id number]  CC:  Chief Complaint  Patient presents with  . Diabetes  . Hyperlipidemia    HPI [name] presents for follow up on diabetes mellitus, hyperlipidemia and mood management.  Patient has complaint of chronic neck pain which is has gradually worsened.  Reports grinding sensation and pain is exacerbated with rotation.  Also has an itchy rash of bilateral extremities.  Denies fever, insect bites, and does report changing detergents.  Diabetes: Pt denies increased urination or thirst. Pt reports medication compliance. No hypoglycemic events. Checking glucose at home.  Fasting blood sugars range in the 140s.  Reports had Covid infection at the end of January of this year.  Continues to monitor her diet.  Tries to have complex carbohydrates in the morning and eats proteins and vegetables in the afternoons.  HLD: Pt taking medication as directed without issues. Reports she was out of Zetia for about 2 weeks due to pharmacy issues.  Continues to limit red meat and eats mostly chicken.  Mood: Taking medication as directed without issues. Reports his wife has noticed a difference with his mood. States feels motivated to go to work and do things.   Past Medical History:  Diagnosis Date  . Diabetes mellitus without complication ([place])   . GERD (gastroesophageal reflux disease)   . Hypertension   . Obesity     Past Surgical History:  Procedure Laterality Date  . APPENDECTOMY      Family History  Problem Relation Age of Onset  . Hypertension Mother   . Diabetes Daughter   . Learning disabilities Maternal Grandmother   . Learning disabilities Maternal Grandfather     Social History   Socioeconomic History  . Marital status: Single    Spouse name: Not on file  . Number of children: Not on file  . Years of education: Not on file  . Highest education level:  Not on file  Occupational History  . Not on file  Tobacco Use  . Smoking status: Former Smoker    Quit date: 11/07/2016    Years since quitting: 3.9  . Smokeless tobacco: Never Used  Substance and Sexual Activity  . Alcohol use: No  . Drug use: No  . Sexual activity: Yes    Birth control/protection: None  Other Topics Concern  . Not on file  Social History Narrative  . Not on file   Social Determinants of Health   Financial Resource Strain: Not on file  Food Insecurity: Not on file  Transportation Needs: Not on file  Physical Activity: Not on file  Stress: Not on file  Social Connections: Not on file  Intimate Partner Violence: Not on file    Outpatient Medications Prior to Visit  Medication Sig Dispense Refill  . allopurinol (ZYLOPRIM) 300 MG tablet TAKE 1 TABLET(300 MG) BY MOUTH DAILY 90 tablet 0  . amLODipine (NORVASC) 5 MG tablet TAKE 1 TABLET(5 MG) BY MOUTH DAILY 90 tablet 0  . b complex vitamins capsule Take 1 capsule by mouth daily.    . Blood Glucose Monitoring Suppl (CONTOUR NEXT ONE) KIT 1 each by Does not apply route 2 (two) times daily. Check fasting blood sugar in morning and then recheck blood sugar after largest meal. 1 kit 0  . Cholecalciferol (VITAMIN D3 PO) Take 500 mg by mouth daily.    . cyclobenzaprine (FLEXERIL) 10  MG tablet TAKE 1 TABLET BY MOUTH DAILY AS NEEDED FOR MUSCLE SPASMS 60 tablet 0  . ezetimibe (ZETIA) 10 MG tablet TAKE 1 TABLET BY MOUTH DAILY 90 tablet 0  . glucose blood test strip Use to check blood sugars three times daily 300 each 4  . Insulin Pen Needle (PEN NEEDLES 31GX5/16") 31G X 8 MM MISC 1 each by Does not apply route daily. Use one needle daily with Victoza injection 100 each 0  . JARDIANCE 25 MG TABS tablet TAKE 1 TABLET(25 MG) BY MOUTH DAILY BEFORE AND BREAKFAST 90 tablet 0  . lisinopril (ZESTRIL) 40 MG tablet TAKE 1 TABLET(40 MG) BY MOUTH DAILY 90 tablet 0  . meloxicam (MOBIC) 7.5 MG tablet TAKE 1 TABLET(7.5 MG) BY MOUTH TWICE  DAILY WITH A MEAL 30 tablet 2  . metFORMIN (GLUCOPHAGE) 1000 MG tablet TAKE 1 TABLET(1000 MG) BY MOUTH TWICE DAILY WITH A MEAL 180 tablet 0  . Multiple Vitamin (MULTIVITAMIN) tablet Take 1 tablet by mouth daily.    . Probiotic Product (PROBIOTIC DAILY PO) Take 1 tablet by mouth daily.    . rosuvastatin (CRESTOR) 5 MG tablet Take 1 tablet (5 mg total) by mouth daily. **NEEDS APT FOR REFILLS** 60 tablet 0  . sertraline (ZOLOFT) 50 MG tablet TAKE 1 TABLET(50 MG) BY MOUTH DAILY 30 tablet 2  . Turmeric (QC TUMERIC COMPLEX PO) Take by mouth.     No facility-administered medications prior to visit.    Allergies  Allergen Reactions  . Cetirizine Shortness Of Breath  . Liraglutide Other (See Comments)    Severe pancreatitis  . Onglyza [Saxagliptin] Other (See Comments)    Potential side effect of pancreatitis    ROS Review of Systems A fourteen system review of systems was performed and found to be positive as per HPI.   Objective:    Physical Exam General:  Well Developed, well nourished, in no acute distress Neuro:  Alert and oriented,  extra-ocular muscles intact  HEENT:  Normocephalic, atraumatic, neck supple Skin:  Erythematous  maculopapular lesions of lower legs with few scaly lesions. Cardiac:  RRR, S1 S2 Respiratory:  ECTA B/L not wheezing, Not using accessory muscles, speaking in full sentences- unlabored. MSK: Tenderness to palpation of C-spine, no step off noted, good cervical ROM Vascular:  Ext warm, no cyanosis apprec.; cap RF less 2 sec. Psych:  No HI/SI, judgement and insight good, Euthymic mood. Full Affect.  BP 131/86   Pulse (!) 104   Temp (!) 96.8 F (36 C)   Ht 6' 1"  (1.854 m)   Wt (!) 301 lb 11.2 oz (136.9 kg)   SpO2 98%   BMI 39.80 kg/m  Wt Readings from Last 3 Encounters:  10/25/20 (!) 301 lb 11.2 oz (136.9 kg)  06/14/20 (!) 316 lb (143.3 kg)  04/13/20 (!) 324 lb 3.2 oz (147.1 kg)     Health Maintenance Due  Topic Date Due  . Hepatitis C Screening   Never done  . COVID-19 Vaccine (1) Never done  . HIV Screening  Never done  . COLONOSCOPY (Pts 45-98yr Insurance coverage will need to be confirmed)  Never done  . OPHTHALMOLOGY EXAM  09/03/2018  . FOOT EXAM  07/16/2019    There are no preventive care reminders to display for this patient.  Lab Results  Component Value Date   TSH 2.870 04/19/2020   Lab Results  Component Value Date   WBC 9.0 04/19/2020   HGB 16.8 04/19/2020   HCT 48.1 04/19/2020  MCV 89 04/19/2020   PLT 251 04/19/2020   Lab Results  Component Value Date   NA 136 04/19/2020   K 4.9 04/19/2020   CO2 22 04/19/2020   GLUCOSE 162 (H) 04/19/2020   BUN 14 04/19/2020   CREATININE 1.24 04/19/2020   BILITOT 0.8 04/19/2020   ALKPHOS 68 04/19/2020   AST 22 04/19/2020   ALT 43 04/19/2020   PROT 6.7 04/19/2020   ALBUMIN 4.6 04/19/2020   CALCIUM 9.5 04/19/2020   ANIONGAP 11 07/18/2019   Lab Results  Component Value Date   CHOL 130 04/19/2020   Lab Results  Component Value Date   HDL 32 (L) 04/19/2020   Lab Results  Component Value Date   LDLCALC 42 04/19/2020   Lab Results  Component Value Date   TRIG 383 (H) 04/19/2020   Lab Results  Component Value Date   CHOLHDL 4.1 04/19/2020   Lab Results  Component Value Date   HGBA1C 6.9 (A) 10/25/2020      Assessment & Plan:   Problem List Items Addressed This Visit      Cardiovascular and Mediastinum   Hypertension associated with diabetes ([place])     Endocrine   Hyperlipidemia associated with type 2 diabetes mellitus ([facility])     Musculoskeletal and Integument   Contact dermatitis   Relevant Medications   triamcinolone cream (KENALOG) 0.1 %    Other Visit Diagnoses    Controlled type 2 diabetes mellitus with other specified complication, without long-term current use of insulin ([facility])    -  Primary   Relevant Orders   POCT UA - Microalbumin (Completed)   POCT glycosylated hemoglobin (Hb A1C) (Completed)   Depression, major, single episode,  mild ([facility])       Neck pain       Relevant Orders   DG Neck Soft Tissue   Ambulatory referral to Orthopedics     Controlled type 2 diabetes mellitus with other specified complication, without long-term current use of insulin: -A1c today 6.9, mildly increased from 6.8.  Goal<7.0. -Continue current medication regimen. Continue to monitor carbohydrates and glucose. Continue ambulatory glucose monitoring.  -UA microalbumin collected today, A:C <30 -Will continue to monitor.  Depression, major, single episode, mild: -Improved.  PHQ-9 score of 1. -Continue current medication regimen. -Will continue to monitor.  Hyperlipidemia associated with type 2 diabetes mellitus: -Last lipid panel: Total cholesterol 130, triglycerides 383, HDL 32, LDL 42 -Continue current medication regimen. -Continue to increase physical activity level, monitor/reduce simple carbohydrates. -Will continue to monitor and repeat at follow-up visit  Hypertension associated with diabetes: -Fairly stable. -Continue current medication regimen.  Last CMP renal function and electrolytes normal. -Will continue to monitor.  Neck pain: -Will place imaging order for cervical x-ray and referral to orthopedics for further evaluation. Discussed with patient likely MSK related such as arthritis.  -Recommend to continue meloxicam as needed for pain relief.  Contact dermatitis: -Discussed with patient to change detergent or socks. Rash is present along sock wear pattern. -Apply topical corticosteroid. -Follow up is symptoms fail to improve or worsen.   Meds ordered this encounter  Medications  . triamcinolone cream (KENALOG) 0.1 %    Sig: Apply 1 application topically 2 (two) times daily.    Dispense:  30 g    Refill:  0    Follow-up: Return in about 3 months (around 01/24/2021) for DM, HLD, Wt.   Note:  This note was prepared with assistance of Dragon voice recognition software. Occasional wrong-word or  sound-a-like  substitutions may have occurred due to the inherent limitations of voice recognition software.  Lorrene Reid, PA-C

## 2020-11-01 ENCOUNTER — Other Ambulatory Visit: Payer: Self-pay | Admitting: Physician Assistant

## 2020-11-04 ENCOUNTER — Ambulatory Visit: Payer: BC Managed Care – PPO | Admitting: Orthopaedic Surgery

## 2020-11-14 ENCOUNTER — Other Ambulatory Visit: Payer: Self-pay | Admitting: Physician Assistant

## 2020-11-14 DIAGNOSIS — E1169 Type 2 diabetes mellitus with other specified complication: Secondary | ICD-10-CM

## 2020-11-14 DIAGNOSIS — E782 Mixed hyperlipidemia: Secondary | ICD-10-CM

## 2020-11-15 ENCOUNTER — Other Ambulatory Visit: Payer: Self-pay | Admitting: Physician Assistant

## 2020-11-15 DIAGNOSIS — E1169 Type 2 diabetes mellitus with other specified complication: Secondary | ICD-10-CM

## 2020-11-18 ENCOUNTER — Encounter: Payer: Self-pay | Admitting: Orthopaedic Surgery

## 2020-11-18 ENCOUNTER — Ambulatory Visit: Payer: Self-pay

## 2020-11-18 ENCOUNTER — Ambulatory Visit (INDEPENDENT_AMBULATORY_CARE_PROVIDER_SITE_OTHER): Payer: BC Managed Care – PPO | Admitting: Orthopaedic Surgery

## 2020-11-18 DIAGNOSIS — M542 Cervicalgia: Secondary | ICD-10-CM

## 2020-11-18 MED ORDER — NABUMETONE 750 MG PO TABS: 750.0000 mg | ORAL_TABLET | Freq: Two times a day (BID) | ORAL | 1 refills | Status: DC | PRN

## 2020-11-18 MED ORDER — METHOCARBAMOL 500 MG PO TABS: 500.0000 mg | ORAL_TABLET | Freq: Four times a day (QID) | ORAL | 1 refills | Status: DC | PRN

## 2020-11-18 NOTE — Progress Notes (Signed)
Office Visit Note   Patient: Eddie Hancock           Date of Birth: 1971/02/27           MRN: [id number] Visit Date: 11/18/2020              Requested by: Lorrene Reid, PA-C [address] [place],  [place] [address] PCP: Lorrene Reid, PA-C   Assessment & Plan: Visit Diagnoses:  1. Neck pain     Plan: I am going to try combination of Robaxin and Relafen as medications for the neck but would like to send her to outpatient physical therapy for any modalities that can help decrease some of the neck pain.  He does not need cervical traction symptoms no radicular symptoms.  He did take a photograph of his x-rays was about was reasonable as well.  All questions and concerns were answered addressed.  I will see him back in 4 weeks to see how he is done after course of therapy for cervical spine and these medications.  All questions and concerns were answered and addressed.  Follow-Up Instructions: Return in about 4 weeks (around 12/16/2020).   Orders:  Orders Placed This Encounter  Procedures  . XR Cervical Spine 2 or 3 views   Meds ordered this encounter  Medications  . methocarbamol (ROBAXIN) 500 MG tablet    Sig: Take 1 tablet (500 mg total) by mouth every 6 (six) hours as needed.    Dispense:  40 tablet    Refill:  1  . nabumetone (RELAFEN) 750 MG tablet    Sig: Take 1 tablet (750 mg total) by mouth 2 (two) times daily as needed.    Dispense:  60 tablet    Refill:  1      Procedures: No procedures performed   Clinical Data: No additional findings.   Subjective: Chief Complaint  Patient presents with  . Neck - Pain  Patient is a very pleasant 50 year old gentleman who comes in with chronic neck pain and a lot of popping in his neck with range of motion of the neck.  He denies any radicular symptoms at all in terms of weakness of his upper extremities or numbness and tingling in his hands or upper extremities.  He takes meloxicam on occasion to help some.   He has sometimes been on other medications such as Tylenol and Advil to see if this will help and it does on occasion.  He does have a history of gout.  He is dealt with low back issues in the past.  He has had Flexeril in the past but that does make him too sleepy and he cannot work on that type of medication.  He has never had spine surgery or neck surgery.  He is a diabetic with a hemoglobin A1c usually just below 7 he states.  HPI  Review of Systems He currently denies a headache, chest pain, shortness of breath, fever, chills, nausea, vomiting  Objective: Vital Signs: There were no vitals taken for this visit.  Physical Exam He is alert and orient x3 and in no acute distress Ortho Exam Examination of the cervical spine shows full range of motion of his neck but definitely pain in the paraspinal muscles and he can sense the popping in his neck with rotation and lateral bending.  He has 5 out of 5 strength of all muscle groups in the bilateral upper extremities and normal sensation in all dermatomes. Specialty Comments:  No specialty comments available.  Imaging: XR Cervical Spine 2 or 3 views  Result Date: 11/18/2020 2 views of the cervical spine shows significant arthritic changes of the lower cervical spine with several levels of significant degenerative disc disease and particular osteophytes.  There is also loss of the cervical lordosis of the lower level.    PMFS History: Patient Active Problem List   Diagnosis Date Noted  . Hypertension associated with diabetes ([place]) 09/11/2019  . Mixed diabetic hyperlipidemia associated with type 2 diabetes mellitus ([place]) 09/11/2019  . Acute pancreatitis 09/11/2019  . BMI 40.0-44.9, adult ([place]) 07/15/2018  . Morbid obesity ([place]) 07/15/2018  . Insomnia 11/29/2017  . Diabetes mellitus without complication ([place]) 16/04/9603  . Hyperlipidemia associated with type 2 diabetes mellitus ([place]) 06/21/2017  . Healthcare maintenance 06/21/2017  .  Contact dermatitis 10/17/2016  . Benign essential hypertension 11/04/2015  . Chronic low back pain without sciatica 11/04/2015  . Hyperglycemia 11/04/2015  . Hypertriglyceridemia 11/04/2015  . Seasonal allergic rhinitis due to pollen 11/04/2015   Past Medical History:  Diagnosis Date  . Diabetes mellitus without complication ([place])   . GERD (gastroesophageal reflux disease)   . Hypertension   . Obesity     Family History  Problem Relation Age of Onset  . Hypertension Mother   . Diabetes Daughter   . Learning disabilities Maternal Grandmother   . Learning disabilities Maternal Grandfather     Past Surgical History:  Procedure Laterality Date  . APPENDECTOMY     Social History   Occupational History  . Not on file  Tobacco Use  . Smoking status: Former Smoker    Quit date: 11/07/2016    Years since quitting: 4.0  . Smokeless tobacco: Never Used  Substance and Sexual Activity  . Alcohol use: No  . Drug use: No  . Sexual activity: Yes    Birth control/protection: None

## 2020-11-19 ENCOUNTER — Other Ambulatory Visit: Payer: Self-pay

## 2020-11-19 DIAGNOSIS — M542 Cervicalgia: Secondary | ICD-10-CM

## 2020-11-21 ENCOUNTER — Other Ambulatory Visit: Payer: Self-pay | Admitting: Physician Assistant

## 2020-11-21 DIAGNOSIS — I152 Hypertension secondary to endocrine disorders: Secondary | ICD-10-CM

## 2020-11-21 DIAGNOSIS — E1159 Type 2 diabetes mellitus with other circulatory complications: Secondary | ICD-10-CM

## 2020-12-02 ENCOUNTER — Other Ambulatory Visit: Payer: Self-pay

## 2020-12-02 ENCOUNTER — Ambulatory Visit (INDEPENDENT_AMBULATORY_CARE_PROVIDER_SITE_OTHER): Payer: BC Managed Care – PPO | Admitting: Physical Therapy

## 2020-12-02 ENCOUNTER — Encounter: Payer: Self-pay | Admitting: Physical Therapy

## 2020-12-02 DIAGNOSIS — R293 Abnormal posture: Secondary | ICD-10-CM

## 2020-12-02 DIAGNOSIS — M542 Cervicalgia: Secondary | ICD-10-CM

## 2020-12-02 NOTE — Patient Instructions (Signed)
Access Code: [email or web address] URL: [email or web address] Date: 12/02/2020 Prepared by: Elsie Ra  Exercises Seated Passive Cervical Retraction - 2 x daily - 6 x weekly - 1-2 sets - 10 reps Seated Assisted Cervical Rotation with Towel - 2 x daily - 6 x weekly - 1 sets - 10 reps - 5 hold Seated Thoracic Lumbar Extension with Pectoralis Stretch - 2 x daily - 6 x weekly - 1-2 sets - 10 reps - 5 sec hold Seated Levator Scapulae Stretch - 2 x daily - 6 x weekly - 2-3 reps - 30 hold Seated Cervical Sidebending Stretch - 2 x daily - 6 x weekly - 1 sets - 2-3 reps - 30 sec hold
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
## 2020-12-11 ENCOUNTER — Other Ambulatory Visit: Payer: Self-pay | Admitting: Physician Assistant

## 2020-12-11 DIAGNOSIS — E1169 Type 2 diabetes mellitus with other specified complication: Secondary | ICD-10-CM

## 2020-12-13 ENCOUNTER — Other Ambulatory Visit: Payer: Self-pay | Admitting: Physician Assistant

## 2020-12-13 DIAGNOSIS — E1169 Type 2 diabetes mellitus with other specified complication: Secondary | ICD-10-CM

## 2020-12-16 ENCOUNTER — Other Ambulatory Visit: Payer: Self-pay | Admitting: Physician Assistant

## 2020-12-16 ENCOUNTER — Ambulatory Visit (INDEPENDENT_AMBULATORY_CARE_PROVIDER_SITE_OTHER): Payer: BC Managed Care – PPO | Admitting: Orthopaedic Surgery

## 2020-12-16 ENCOUNTER — Encounter: Payer: Self-pay | Admitting: Orthopaedic Surgery

## 2020-12-16 DIAGNOSIS — F320 Major depressive disorder, single episode, mild: Secondary | ICD-10-CM

## 2020-12-16 DIAGNOSIS — M542 Cervicalgia: Secondary | ICD-10-CM

## 2020-12-16 DIAGNOSIS — E1169 Type 2 diabetes mellitus with other specified complication: Secondary | ICD-10-CM

## 2020-12-16 MED ORDER — CELECOXIB 400 MG PO CAPS: 400.0000 mg | ORAL_CAPSULE | Freq: Every day | ORAL | 3 refills | Status: DC | PRN

## 2020-12-16 NOTE — Progress Notes (Signed)
The patient is a 50 year old gentleman I am seeing for the second time now.  We sent him to physical therapy due to popping and pain in the cervical spine with known osteoarthritis of cervical spine.  There were no radicular components going down either arm and he still has no radicular components.  1 therapy session is certainly helped greatly.  They have been had to delay therapy due to staffing issues and is resuming therapy I think next week on his cervical spine.  He can tell Robaxin has helped much.  Tylenol ES if helped more.  I tried Relafen and that did not help much.  He is asking about a potential different anti-inflammatory.  He still denies any radicular symptoms or weakness.  He does have improved range of motion of the cervical spine and no weakness in his upper extremities.  I will try Celebrex as an anti-inflammatory and see if that will help.  I do feel it is worth him continuing physical therapy as does he.  I like to see him back in 4 weeks to see if that is continuing to help.  All questions and concerns were answered and addressed.

## 2020-12-20 ENCOUNTER — Ambulatory Visit (INDEPENDENT_AMBULATORY_CARE_PROVIDER_SITE_OTHER): Payer: BC Managed Care – PPO | Admitting: Rehabilitative and Restorative Service Providers"

## 2020-12-20 ENCOUNTER — Other Ambulatory Visit: Payer: Self-pay

## 2020-12-20 ENCOUNTER — Encounter: Payer: Self-pay | Admitting: Rehabilitative and Restorative Service Providers"

## 2020-12-20 DIAGNOSIS — M542 Cervicalgia: Secondary | ICD-10-CM | POA: Diagnosis not present

## 2020-12-20 DIAGNOSIS — R293 Abnormal posture: Secondary | ICD-10-CM | POA: Diagnosis not present

## 2020-12-20 NOTE — Therapy (Signed)
[facility] Physical Therapy [address] [place], [place], [address] Phone: [phone]   Fax:  [phone]  Physical Therapy Treatment  Patient Details  Name: Eddie Hancock MRN: [id number] Date of Birth: 1971/06/26 Referring Provider (PT): Jean Rosenthal   Encounter Date: 12/20/2020   PT End of Session - 12/20/20 1611     Visit Number 2    Number of Visits 12    Date for PT Re-Evaluation 01/13/21    PT Start Time 4970    PT Stop Time 1629    PT Time Calculation (min) 39 min    Activity Tolerance Patient tolerated treatment well    Behavior During Therapy [facility] for tasks assessed/performed             Past Medical History:  Diagnosis Date   Diabetes mellitus without complication ([place])    GERD (gastroesophageal reflux disease)    Hypertension    Obesity     Past Surgical History:  Procedure Laterality Date   APPENDECTOMY      There were no vitals filed for this visit.   Subjective Assessment - 12/20/20 1554     Subjective Pt. stated complaints of neck and mid upper back pain c stabbing pain noted.    Pertinent History PMH:DM,HTN,obesity,insomnia,chronic neck and back pain    Limitations Standing;Walking;House hold activities;Sitting    Diagnostic tests neck XR 11/18/20 "significant arthritic changes of the lower cervical spine with several levels of significant degenerative disc disease and particular osteophytes.  There is also loss of the cervical lordosis of the lower level."    Patient Stated Goals get the pain down    Currently in Pain? Yes    Pain Score 8     Pain Location Neck   upper thoracic   Pain Orientation Posterior;Upper;Mid    Pain Onset More than a month ago    Aggravating Factors  thoracic Lt and Rt lean, rotation cervical/thoracic    Pain Relieving Factors avoid medicine, pillow seemed to help                [facility] PT Assessment - 12/20/20 0001       Assessment   Medical Diagnosis Chronic neck pain     Referring Provider (PT) Jean Rosenthal      Palpation   Palpation comment Trigger points noted in bilateral upper trap                           [facility] Adult PT Treatment/Exercise - 12/20/20 0001       Exercises   Exercises Other Exercises    Other Exercises  Review of existing HEP      Neck Exercises: Machines for Strengthening   UBE (Upper Arm Bike) Lvl 3.0 4 mins fwd/back for motion      Neck Exercises: Seated   Shoulder Rolls Backwards;10 reps   retraction hold 5 seconds     Manual Therapy   Manual therapy comments STM to bilateral upper trap      Neck Exercises: Stretches   Upper Trapezius Stretch 3 reps;Left;Right   15 sec x 3 bilateral             Trigger Point Dry Needling - 12/20/20 0001     Consent Given? Yes    Education Handout Provided Yes    Muscles Treated Head and Neck Upper trapezius   bilateral upper trap   Upper Trapezius Response Twitch reponse elicited  PT Education - 12/20/20 1555     Education Details DN    Person(s) Educated Patient    Methods Explanation;Demonstration;Verbal cues;Handout    Comprehension Verbalized understanding;Returned demonstration                 PT Long Term Goals - 12/02/20 1651       PT LONG TERM GOAL #1   Title Pt will be I and compliant with HEP.    Time 6    Period Weeks    Status New    Target Date 01/13/21      PT LONG TERM GOAL #2   Title Pt will improve FOTO to at least 56% functional score    Time 6    Period Weeks    Status New      PT LONG TERM GOAL #3   Title Pt will improve neck AROM to WNL with only minimal complaints.    Time 6    Period Weeks    Status New      PT LONG TERM GOAL #4   Title Pt will report less than 4/10 overall pain with usual activity and work.    Time 6    Period Weeks    Status New                   Plan - 12/20/20 1610     Clinical Impression Statement Strong localized twitch response noted  from upper trap trigger points today in clinic.  Presentation of cervicothoracic mobility deficits c associated postural changes (forward head, mild increase in thoracic kyphosis) to be addressed c continued skilled PT services.    Personal Factors and Comorbidities Comorbidity 3+;Past/Current Experience;Time since onset of injury/illness/exacerbation    Comorbidities PMH:DM,HTN,obesity,insomnia,chronic neck and back pain    Examination-Activity Limitations Bend;Carry;Lift;Reach Overhead    Examination-Participation Restrictions Cleaning;Driving;Laundry;Yard Work;Occupation    Stability/Clinical Decision Making Evolving/Moderate complexity    Rehab Potential Good    PT Frequency 2x / week   1-2   PT Duration 6 weeks   4-6   PT Treatment/Interventions Cryotherapy;Electrical Stimulation;Iontophoresis 4mg /ml Dexamethasone;Moist Heat;Traction;Ultrasound;Therapeutic exercise;Therapeutic activities;Neuromuscular re-education;Manual techniques;Passive range of motion;Dry needling;Joint Manipulations;Spinal Manipulations;Vasopneumatic Device;Taping    PT Next Visit Plan Dry needling, manual thoracic manipulation.  Cervicothoraicc mobility improvements.    PT Home Exercise Plan Access Code: [email or web address]    Consulted and Agree with Plan of Care Patient             Patient will benefit from skilled therapeutic intervention in order to improve the following deficits and impairments:  Decreased activity tolerance, Decreased mobility, Decreased range of motion, Decreased strength, Hypomobility, Increased fascial restricitons, Increased muscle spasms, Impaired flexibility, Postural dysfunction, Improper body mechanics, Pain  Visit Diagnosis: Cervicalgia  Abnormal posture     Problem List Patient Active Problem List   Diagnosis Date Noted   Hypertension associated with diabetes ([place]) 09/11/2019   Mixed diabetic hyperlipidemia associated with type 2 diabetes mellitus ([place]) 09/11/2019   Acute  pancreatitis 09/11/2019   BMI 40.0-44.9, adult ([place]) 07/15/2018   Morbid obesity ([place]) 07/15/2018   Insomnia 11/29/2017   Diabetes mellitus without complication ([place]) 35/00/9381   Hyperlipidemia associated with type 2 diabetes mellitus ([place]) 06/21/2017   Healthcare maintenance 06/21/2017   Contact dermatitis 10/17/2016   Benign essential hypertension 11/04/2015   Chronic low back pain without sciatica 11/04/2015   Hyperglycemia 11/04/2015   Hypertriglyceridemia 11/04/2015   Seasonal allergic rhinitis due to pollen 11/04/2015   Scot Jun, PT, DPT, OCS,  ATC 12/20/20  4:23 PM    [facility] [address] [place], [place], [address] Phone: [phone]   Fax:  [phone]  Name: ALEKSANDER EDMISTON MRN: [id number] Date of Birth: [birth year]

## 2020-12-22 ENCOUNTER — Ambulatory Visit (INDEPENDENT_AMBULATORY_CARE_PROVIDER_SITE_OTHER): Payer: BC Managed Care – PPO | Admitting: Physical Therapy

## 2020-12-22 ENCOUNTER — Other Ambulatory Visit: Payer: Self-pay | Admitting: Physician Assistant

## 2020-12-22 ENCOUNTER — Telehealth: Payer: Self-pay | Admitting: Orthopaedic Surgery

## 2020-12-22 ENCOUNTER — Other Ambulatory Visit: Payer: Self-pay

## 2020-12-22 DIAGNOSIS — M542 Cervicalgia: Secondary | ICD-10-CM

## 2020-12-22 DIAGNOSIS — R293 Abnormal posture: Secondary | ICD-10-CM

## 2020-12-22 MED ORDER — CELECOXIB 400 MG PO CAPS: 400.0000 mg | ORAL_CAPSULE | Freq: Every day | ORAL | 3 refills | Status: DC | PRN

## 2020-12-22 NOTE — Telephone Encounter (Signed)
Resent rx

## 2020-12-22 NOTE — Therapy (Signed)
[facility] Physical Therapy [address] [place], [place], [address] Phone: [phone]   Fax:  [phone]  Physical Therapy Treatment  Patient Details  Name: Eddie Hancock MRN: [id number] Date of Birth: Mar 04, 1971 Referring Provider (PT): Jean Rosenthal   Encounter Date: 12/22/2020   PT End of Session - 12/22/20 1638     Visit Number 3    Number of Visits 12    Date for PT Re-Evaluation 01/13/21    PT Start Time 1600    PT Stop Time 1642    PT Time Calculation (min) 42 min    Activity Tolerance Patient tolerated treatment well    Behavior During Therapy [facility] for tasks assessed/performed             Past Medical History:  Diagnosis Date   Diabetes mellitus without complication ([place])    GERD (gastroesophageal reflux disease)    Hypertension    Obesity     Past Surgical History:  Procedure Laterality Date   APPENDECTOMY      There were no vitals filed for this visit.   Subjective Assessment - 12/22/20 1625     Subjective Pt relays the DN from last visit definitely helped some so he is intrested in that again for future appointments. he says pain is about 5/10 today overall and he does not have as much tighntess.    Pertinent History PMH:DM,HTN,obesity,insomnia,chronic neck and back pain    Limitations Standing;Walking;House hold activities;Sitting    Diagnostic tests neck XR 11/18/20 "significant arthritic changes of the lower cervical spine with several levels of significant degenerative disc disease and particular osteophytes.  There is also loss of the cervical lordosis of the lower level."    Patient Stated Goals get the pain down    Pain Onset More than a month ago                [facility] PT Assessment - 12/22/20 0001       Assessment   Medical Diagnosis Chronic neck pain    Referring Provider (PT) Jean Rosenthal      AROM   Cervical Flexion 40    Cervical Extension 40    Cervical - Right Side Bend 40    Cervical  - Left Side Bend 40    Cervical - Right Rotation WNL    Cervical - Left Rotation WNL               [facility] Adult PT Treatment/Exercise - 12/22/20 0001       Neck Exercises: Machines for Strengthening   UBE (Upper Arm Bike) Lvl 3.0 3 mins fwd and 3 min back for motion      Neck Exercises: Theraband   Shoulder Extension 20 reps;Blue    Rows 20 reps;Blue    Shoulder External Rotation 20 reps;Green    Shoulder External Rotation Limitations bilat at same time with scap retraction    Horizontal ABduction 20 reps;Green   bilat at same time     Modalities   Modalities Traction      Traction   Type of Traction Cervical    Min (lbs) 15    Max (lbs) 20    Time 15 min pre set program                         PT Long Term Goals - 12/02/20 1651       PT LONG TERM GOAL #1   Title Pt will be  I and compliant with HEP.    Time 6    Period Weeks    Status New    Target Date 01/13/21      PT LONG TERM GOAL #2   Title Pt will improve FOTO to at least 56% functional score    Time 6    Period Weeks    Status New      PT LONG TERM GOAL #3   Title Pt will improve neck AROM to WNL with only minimal complaints.    Time 6    Period Weeks    Status New      PT LONG TERM GOAL #4   Title Pt will report less than 4/10 overall pain with usual activity and work.    Time 6    Period Weeks    Status New                   Plan - 12/22/20 1639     Clinical Impression Statement trialed mechanical traction today as well as some thoracic mobility and strengthening today and we will assess his long term response to this next time.    Personal Factors and Comorbidities Comorbidity 3+;Past/Current Experience;Time since onset of injury/illness/exacerbation    Comorbidities PMH:DM,HTN,obesity,insomnia,chronic neck and back pain    Examination-Activity Limitations Bend;Carry;Lift;Reach Overhead    Examination-Participation Restrictions Cleaning;Driving;Laundry;Yard  Work;Occupation    Stability/Clinical Decision Making Evolving/Moderate complexity    Rehab Potential Good    PT Frequency 2x / week   1-2   PT Duration 6 weeks   4-6   PT Treatment/Interventions Cryotherapy;Electrical Stimulation;Iontophoresis 4mg /ml Dexamethasone;Moist Heat;Traction;Ultrasound;Therapeutic exercise;Therapeutic activities;Neuromuscular re-education;Manual techniques;Passive range of motion;Dry needling;Joint Manipulations;Spinal Manipulations;Vasopneumatic Device;Taping    PT Next Visit Plan Dry needling, manual thoracic manipulation.  Cervicothoraicc mobility improvements.    PT Home Exercise Plan Access Code: [email or web address]    Consulted and Agree with Plan of Care Patient             Patient will benefit from skilled therapeutic intervention in order to improve the following deficits and impairments:  Decreased activity tolerance, Decreased mobility, Decreased range of motion, Decreased strength, Hypomobility, Increased fascial restricitons, Increased muscle spasms, Impaired flexibility, Postural dysfunction, Improper body mechanics, Pain  Visit Diagnosis: Cervicalgia  Abnormal posture     Problem List Patient Active Problem List   Diagnosis Date Noted   Hypertension associated with diabetes ([place]) 09/11/2019   Mixed diabetic hyperlipidemia associated with type 2 diabetes mellitus ([place]) 09/11/2019   Acute pancreatitis 09/11/2019   BMI 40.0-44.9, adult ([place]) 07/15/2018   Morbid obesity ([place]) 07/15/2018   Insomnia 11/29/2017   Diabetes mellitus without complication ([place]) 62/94/7654   Hyperlipidemia associated with type 2 diabetes mellitus ([place]) 06/21/2017   Healthcare maintenance 06/21/2017   Contact dermatitis 10/17/2016   Benign essential hypertension 11/04/2015   Chronic low back pain without sciatica 11/04/2015   Hyperglycemia 11/04/2015   Hypertriglyceridemia 11/04/2015   Seasonal allergic rhinitis due to pollen 11/04/2015    Silvestre Mesi 12/22/2020, 4:46 PM  [facility] [address] [place], [place], [address] Phone: [phone]   Fax:  [phone]  Name: Eddie Hancock MRN: [id number] Date of Birth: 1970/12/25

## 2020-12-22 NOTE — Telephone Encounter (Signed)
Pt would like to have his celebrex rx switched to the mark [place].

## 2020-12-27 ENCOUNTER — Ambulatory Visit (INDEPENDENT_AMBULATORY_CARE_PROVIDER_SITE_OTHER): Payer: BC Managed Care – PPO | Admitting: Rehabilitative and Restorative Service Providers"

## 2020-12-27 ENCOUNTER — Encounter: Payer: Self-pay | Admitting: Rehabilitative and Restorative Service Providers"

## 2020-12-27 ENCOUNTER — Other Ambulatory Visit: Payer: Self-pay

## 2020-12-27 DIAGNOSIS — M542 Cervicalgia: Secondary | ICD-10-CM

## 2020-12-27 DIAGNOSIS — R293 Abnormal posture: Secondary | ICD-10-CM

## 2020-12-27 NOTE — Therapy (Signed)
[facility] [address] [place], [place], [address] Phone: [phone]   Fax:  [phone]  Physical Therapy Treatment  Patient Details  Name: Eddie Hancock MRN: [id number] Date of Birth: July 06, 1971 Referring Provider (PT): Jean Rosenthal   Encounter Date: 12/27/2020   PT End of Session - 12/27/20 1605     Visit Number 4    Number of Visits 12    Date for PT Re-Evaluation 01/13/21    Progress Note Due on Visit 10    PT Start Time 7062    PT Stop Time 1628    PT Time Calculation (min) 38 min    Activity Tolerance Patient tolerated treatment well    Behavior During Therapy [facility] for tasks assessed/performed             Past Medical History:  Diagnosis Date   Diabetes mellitus without complication ([place])    GERD (gastroesophageal reflux disease)    Hypertension    Obesity     Past Surgical History:  Procedure Laterality Date   APPENDECTOMY      There were no vitals filed for this visit.   Subjective Assessment - 12/27/20 1555     Subjective Pt. indicated pain increased back to its previous level after last visit (indicated traction).  Had to call out of work 2 days because of it (stiffness/pain).    Pertinent History PMH:DM,HTN,obesity,insomnia,chronic neck and back pain    Limitations Standing;Walking;House hold activities;Sitting    Diagnostic tests neck XR 11/18/20 "significant arthritic changes of the lower cervical spine with several levels of significant degenerative disc disease and particular osteophytes.  There is also loss of the cervical lordosis of the lower level."    Patient Stated Goals get the pain down    Currently in Pain? Yes    Pain Score 7     Pain Location Neck    Pain Orientation Posterior;Left;Right    Pain Descriptors / Indicators Aching;Tightness;Sore;Sharp    Pain Type Chronic pain    Pain Onset More than a month ago    Pain Frequency Intermittent    Aggravating Factors  worse for several days  after traction use.  movement tightness    Pain Relieving Factors nothing much in the last weekend.                               [place] [facility] Adult PT Treatment/Exercise - 12/27/20 0001       Neck Exercises: Machines for Strengthening   UBE (Upper Arm Bike) Lvl 3.0 4 mins fwd and 4 min back for motion      Neck Exercises: Theraband   Shoulder Extension Green;20 reps    Rows Green;15 reps   5 sec hold in scapular retraction     Neck Exercises: Seated   Neck Retraction 5 reps    Other Seated Exercise Lt and Rt rotation 10      Moist Heat Therapy   Number Minutes Moist Heat 5 Minutes   bilateral upper traps c stretching   Moist Heat Location Cervical      Manual Therapy   Manual therapy comments STM to bilateral upper trap      Neck Exercises: Stretches   Upper Trapezius Stretch 3 reps;Left;Right              Trigger Point Dry Needling - 12/27/20 0001     Consent Given? Yes    Education Handout Provided Previously provided  Upper Trapezius Response Twitch reponse elicited;Palpable increased muscle length   bilateral                      PT Long Term Goals - 12/02/20 1651       PT LONG TERM GOAL #1   Title Pt will be I and compliant with HEP.    Time 6    Period Weeks    Status New    Target Date 01/13/21      PT LONG TERM GOAL #2   Title Pt will improve FOTO to at least 56% functional score    Time 6    Period Weeks    Status New      PT LONG TERM GOAL #3   Title Pt will improve neck AROM to WNL with only minimal complaints.    Time 6    Period Weeks    Status New      PT LONG TERM GOAL #4   Title Pt will report less than 4/10 overall pain with usual activity and work.    Time 6    Period Weeks    Status New                   Plan - 12/27/20 1606     Clinical Impression Statement Immediate improvement in cervical mobility and symptoms c movement after manual intervention/dry needling.  Due to Pt. complaints,  withheld plan for traction use.    Personal Factors and Comorbidities Comorbidity 3+;Past/Current Experience;Time since onset of injury/illness/exacerbation    Comorbidities PMH:DM,HTN,obesity,insomnia,chronic neck and back pain    Examination-Activity Limitations Bend;Carry;Lift;Reach Overhead    Examination-Participation Restrictions Cleaning;Driving;Laundry;Yard Work;Occupation    Stability/Clinical Decision Making Evolving/Moderate complexity    Rehab Potential Good    PT Frequency 2x / week   1-2   PT Duration 6 weeks   4-6   PT Treatment/Interventions Cryotherapy;Electrical Stimulation;Iontophoresis 4mg /ml Dexamethasone;Moist Heat;Traction;Ultrasound;Therapeutic exercise;Therapeutic activities;Neuromuscular re-education;Manual techniques;Passive range of motion;Dry needling;Joint Manipulations;Spinal Manipulations;Vasopneumatic Device;Taping    PT Next Visit Plan Dry needling, manual thoracic manipulation.  Cervicothoraicc mobility improvements.    PT Home Exercise Plan Access Code: [email or web address]    Consulted and Agree with Plan of Care Patient             Patient will benefit from skilled therapeutic intervention in order to improve the following deficits and impairments:  Decreased activity tolerance, Decreased mobility, Decreased range of motion, Decreased strength, Hypomobility, Increased fascial restricitons, Increased muscle spasms, Impaired flexibility, Postural dysfunction, Improper body mechanics, Pain  Visit Diagnosis: Cervicalgia  Abnormal posture     Problem List Patient Active Problem List   Diagnosis Date Noted   Hypertension associated with diabetes ([place]) 09/11/2019   Mixed diabetic hyperlipidemia associated with type 2 diabetes mellitus ([place]) 09/11/2019   Acute pancreatitis 09/11/2019   BMI 40.0-44.9, adult ([place]) 07/15/2018   Morbid obesity ([place]) 07/15/2018   Insomnia 11/29/2017   Diabetes mellitus without complication ([place]) 81/19/1478   Hyperlipidemia  associated with type 2 diabetes mellitus ([place]) 06/21/2017   Healthcare maintenance 06/21/2017   Contact dermatitis 10/17/2016   Benign essential hypertension 11/04/2015   Chronic low back pain without sciatica 11/04/2015   Hyperglycemia 11/04/2015   Hypertriglyceridemia 11/04/2015   Seasonal allergic rhinitis due to pollen 11/04/2015   Scot Jun, PT, DPT, OCS, ATC 12/27/20  4:19 PM    [facility] [address] [place], [place], [address] Phone: [phone]   Fax:  [phone]  Name: Eddie Hancock  Eddie Hancock MRN: [id number] Date of Birth: 11-Dec-1970

## 2020-12-29 ENCOUNTER — Ambulatory Visit (INDEPENDENT_AMBULATORY_CARE_PROVIDER_SITE_OTHER): Payer: BC Managed Care – PPO | Admitting: Physical Therapy

## 2020-12-29 ENCOUNTER — Other Ambulatory Visit: Payer: Self-pay | Admitting: Physician Assistant

## 2020-12-29 ENCOUNTER — Other Ambulatory Visit: Payer: Self-pay

## 2020-12-29 DIAGNOSIS — M545 Low back pain, unspecified: Secondary | ICD-10-CM

## 2020-12-29 DIAGNOSIS — M542 Cervicalgia: Secondary | ICD-10-CM

## 2020-12-29 DIAGNOSIS — G8929 Other chronic pain: Secondary | ICD-10-CM

## 2020-12-29 DIAGNOSIS — R293 Abnormal posture: Secondary | ICD-10-CM

## 2020-12-29 NOTE — Therapy (Addendum)
[facility] [address] [place], [place], [address] Phone: [phone]   Fax:  [phone]  Physical Therapy Treatment/Discharge   Patient Details  Name: Eddie Hancock MRN: [id number] Date of Birth: [birth year] Referring Provider (PT): Jean Rosenthal   Encounter Date: 12/29/2020   PT End of Session - 12/29/20 1629     Visit Number 5    Number of Visits 12    Date for PT Re-Evaluation 01/13/21    Progress Note Due on Visit 10    PT Start Time 5056    PT Stop Time 1632    PT Time Calculation (min) 42 min    Activity Tolerance Patient tolerated treatment well    Behavior During Therapy [facility] for tasks assessed/performed             Past Medical History:  Diagnosis Date   Diabetes mellitus without complication ([place])    GERD (gastroesophageal reflux disease)    Hypertension    Obesity     Past Surgical History:  Procedure Laterality Date   APPENDECTOMY      There were no vitals filed for this visit.   Subjective Assessment - 12/29/20 1557     Subjective Pt. states he felt better after the DN session last time. He relays 6/10 stiffness and 5/10 pain in his neck overall    Pertinent History PMH:DM,HTN,obesity,insomnia,chronic neck and back pain    Limitations Standing;Walking;House hold activities;Sitting    Diagnostic tests neck XR 11/18/20 "significant arthritic changes of the lower cervical spine with several levels of significant degenerative disc disease and particular osteophytes.  There is also loss of the cervical lordosis of the lower level."    Patient Stated Goals get the pain down    Pain Onset More than a month ago              [facility] Adult PT Treatment/Exercise - 12/29/20 0001       Neck Exercises: Machines for Strengthening   UBE (Upper Arm Bike) Lvl 3.0 3 mins fwd and 3 min back for motion      Neck Exercises: Theraband   Shoulder Extension 20 reps;Blue    Rows 15 reps;Blue    Shoulder External  Rotation 20 reps;Green    Shoulder External Rotation Limitations bilat at same time with scap retraction    Horizontal ABduction 20 reps;Green      Neck Exercises: Seated   Neck Retraction 10 reps    Other Seated Exercise Lt and Rt rotation 10 times with self overpressure    Other Seated Exercise seated thoracic extensions over 1/2 roll at chair back 10 sec X10      Moist Heat Therapy   Number Minutes Moist Heat 10 Minutes   with stretching   Moist Heat Location Cervical      Manual Therapy   Manual therapy comments S.O release, cervical PROM into sidebends and rotations, STM to upper traps                         PT Long Term Goals - 12/02/20 1651       PT LONG TERM GOAL #1   Title Pt will be I and compliant with HEP.    Time 6    Period Weeks    Status New    Target Date 01/13/21      PT LONG TERM GOAL #2   Title Pt will improve FOTO to at least 56% functional score  Time 6    Period Weeks    Status New      PT LONG TERM GOAL #3   Title Pt will improve neck AROM to WNL with only minimal complaints.    Time 6    Period Weeks    Status New      PT LONG TERM GOAL #4   Title Pt will report less than 4/10 overall pain with usual activity and work.    Time 6    Period Weeks    Status New                   Plan - 12/29/20 1632     Clinical Impression Statement He had overall less pain and less stiffness in his neck after stretches, manual therapy, and thoracic-cervical exercises. Continue POC with DN if he prefers    Personal Factors and Comorbidities Comorbidity 3+;Past/Current Experience;Time since onset of injury/illness/exacerbation    Comorbidities PMH:DM,HTN,obesity,insomnia,chronic neck and back pain    Examination-Activity Limitations Bend;Carry;Lift;Reach Overhead    Examination-Participation Restrictions Cleaning;Driving;Laundry;Yard Work;Occupation    Stability/Clinical Decision Making Evolving/Moderate complexity    Rehab  Potential Good    PT Frequency 2x / week   1-2   PT Duration 6 weeks   4-6   PT Treatment/Interventions Cryotherapy;Electrical Stimulation;Iontophoresis 51m/ml Dexamethasone;Moist Heat;Traction;Ultrasound;Therapeutic exercise;Therapeutic activities;Neuromuscular re-education;Manual techniques;Passive range of motion;Dry needling;Joint Manipulations;Spinal Manipulations;Vasopneumatic Device;Taping    PT Next Visit Plan Dry needling, manual thoracic manipulation.  Cervicothoraicc mobility improvements.    PT Home Exercise Plan Access Code: [email or web address] added seated thoracic extensions    Consulted and Agree with Plan of Care Patient             Patient will benefit from skilled therapeutic intervention in order to improve the following deficits and impairments:  Decreased activity tolerance, Decreased mobility, Decreased range of motion, Decreased strength, Hypomobility, Increased fascial restricitons, Increased muscle spasms, Impaired flexibility, Postural dysfunction, Improper body mechanics, Pain  Visit Diagnosis: Cervicalgia  Abnormal posture     Problem List Patient Active Problem List   Diagnosis Date Noted   Hypertension associated with diabetes ([place] 09/11/2019   Mixed diabetic hyperlipidemia associated with type 2 diabetes mellitus ([place] 09/11/2019   Acute pancreatitis 09/11/2019   BMI 40.0-44.9, adult ([place] 07/15/2018   Morbid obesity ([place] 07/15/2018   Insomnia 11/29/2017   Diabetes mellitus without complication ([place] 159/93/5701  Hyperlipidemia associated with type 2 diabetes mellitus ([place] 06/21/2017   Healthcare maintenance 06/21/2017   Contact dermatitis 10/17/2016   Benign essential hypertension 11/04/2015   Chronic low back pain without sciatica 11/04/2015   Hyperglycemia 11/04/2015   Hypertriglyceridemia 11/04/2015   Seasonal allergic rhinitis due to pollen 11/04/2015    BMarrianne MoodNelson,PT,DPT 12/29/2020, 4:34 PM  PHYSICAL THERAPY DISCHARGE SUMMARY  Visits  from Start of Care: 5  Current functional level related to goals / functional outcomes: See note   Remaining deficits: See note   Education / Equipment: HEP   Patient agrees to discharge. Patient goals were partially met. Patient is being discharged due to not returning since the last visit.  MScot Jun PT, DPT, OCS, ATC 02/18/21  9:42 AM     [facility] [address] [place] [address]Phone: [phone]  Fax:  [phone] Name: Eddie DUGALMRN: [id number] of Birth: 2Nov 15, 1972

## 2020-12-30 ENCOUNTER — Other Ambulatory Visit: Payer: Self-pay | Admitting: Family Medicine

## 2021-01-03 ENCOUNTER — Other Ambulatory Visit: Payer: Self-pay | Admitting: Orthopaedic Surgery

## 2021-01-03 ENCOUNTER — Encounter: Payer: BC Managed Care – PPO | Admitting: Rehabilitative and Restorative Service Providers"

## 2021-01-05 ENCOUNTER — Encounter: Payer: BC Managed Care – PPO | Admitting: Physical Therapy

## 2021-01-11 ENCOUNTER — Encounter: Payer: BC Managed Care – PPO | Admitting: Rehabilitative and Restorative Service Providers"

## 2021-01-13 ENCOUNTER — Encounter: Payer: BC Managed Care – PPO | Admitting: Rehabilitative and Restorative Service Providers"

## 2021-01-16 ENCOUNTER — Other Ambulatory Visit: Payer: Self-pay | Admitting: Family Medicine

## 2021-01-17 ENCOUNTER — Other Ambulatory Visit: Payer: Self-pay | Admitting: Physician Assistant

## 2021-01-17 ENCOUNTER — Ambulatory Visit: Payer: BC Managed Care – PPO | Admitting: Orthopaedic Surgery

## 2021-01-17 DIAGNOSIS — E1169 Type 2 diabetes mellitus with other specified complication: Secondary | ICD-10-CM

## 2021-01-17 MED ORDER — GLUCOSE BLOOD VI STRP: ORAL_STRIP | 4 refills | Status: DC

## 2021-01-24 ENCOUNTER — Other Ambulatory Visit: Payer: Self-pay

## 2021-01-24 ENCOUNTER — Encounter: Payer: Self-pay | Admitting: Physician Assistant

## 2021-01-24 ENCOUNTER — Ambulatory Visit (INDEPENDENT_AMBULATORY_CARE_PROVIDER_SITE_OTHER): Payer: BC Managed Care – PPO | Admitting: Physician Assistant

## 2021-01-24 VITALS — BP 104/71 | HR 90 | Temp 98.2°F | Ht 73.0 in | Wt 296.7 lb

## 2021-01-24 DIAGNOSIS — E1159 Type 2 diabetes mellitus with other circulatory complications: Secondary | ICD-10-CM | POA: Diagnosis not present

## 2021-01-24 DIAGNOSIS — M1990 Unspecified osteoarthritis, unspecified site: Secondary | ICD-10-CM

## 2021-01-24 DIAGNOSIS — E785 Hyperlipidemia, unspecified: Secondary | ICD-10-CM

## 2021-01-24 DIAGNOSIS — F320 Major depressive disorder, single episode, mild: Secondary | ICD-10-CM | POA: Diagnosis not present

## 2021-01-24 DIAGNOSIS — E1169 Type 2 diabetes mellitus with other specified complication: Secondary | ICD-10-CM

## 2021-01-24 DIAGNOSIS — M47812 Spondylosis without myelopathy or radiculopathy, cervical region: Secondary | ICD-10-CM

## 2021-01-24 DIAGNOSIS — I152 Hypertension secondary to endocrine disorders: Secondary | ICD-10-CM

## 2021-01-25 NOTE — Progress Notes (Signed)
Established Patient Office Visit  Subjective:  Patient ID: Eddie Hancock, male    DOB: [birth year]  Age: 50 y.o. MRN: [id number]  CC:  Chief Complaint  Patient presents with   Follow-up   Diabetes   Hypertension    HPI CURTIES CONIGLIARO presents for follow up on diabetes mellitus, hypertension and mood management.  Patient was evaluated by orthopedics for neck pain and has tried different medications including Celebrex and nabumetone for his arthritis which were ineffective.  Has needed to take ibuprofen as needed which provides mild to moderate relief.  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  Reports has tried different types of steel toe shoes including tennis shoes but has not been able to find a comfortable pair.  States by the end of the day can barely walk due to significant pain of both feet and lower back.  Diabetes: Pt denies increased urination or thirst. Pt reports medication compliance. No hypoglycemic events. Checking glucose at home.  Fasting blood sugars average in the 130s.  States has been working on changing his eating habits and monitoring more closely his carbohydrate intake.  HTN: Pt denies chest pain, palpitations, shortness of breath or lower extremity swelling.  Reports lately has been experiencing dizziness with standing.  Taking medication as directed without side effects.  Has not been checking blood pressure at home recently. Pt follows a low salt diet.  Tries to stay hydrated.  Mood: Reports medication compliance.  Denies mood changes or SI/HI.  Past Medical History:  Diagnosis Date   Diabetes mellitus without complication (HCC)    GERD (gastroesophageal reflux disease)    Hypertension    Obesity     Past Surgical History:  Procedure Laterality Date   APPENDECTOMY      Family History  Problem Relation Age of Onset   Hypertension Mother    Diabetes Daughter    Learning disabilities Maternal Grandmother    Learning  disabilities Maternal Grandfather     Social History   Socioeconomic History   Marital status: Single    Spouse name: Not on file   Number of children: Not on file   Years of education: Not on file   Highest education level: Not on file  Occupational History   Not on file  Tobacco Use   Smoking status: Former    Types: Cigarettes    Quit date: 11/07/2016    Years since quitting: 4.2   Smokeless tobacco: Never  Substance and Sexual Activity   Alcohol use: No   Drug use: No   Sexual activity: Yes    Birth control/protection: None  Other Topics Concern   Not on file  Social History Narrative   Not on file   Social Determinants of Health   Financial Resource Strain: Not on file  Food Insecurity: Not on file  Transportation Needs: Not on file  Physical Activity: Not on file  Stress: Not on file  Social Connections: Not on file  Intimate Partner Violence: Not on file    Outpatient Medications Prior to Visit  Medication Sig Dispense Refill   allopurinol (ZYLOPRIM) 300 MG tablet TAKE 1 TABLET(300 MG) BY MOUTH DAILY 90 tablet 0   amLODipine (NORVASC) 5 MG tablet TAKE 1 TABLET(5 MG) BY MOUTH DAILY 90 tablet 0   b complex vitamins capsule Take 1 capsule by mouth daily.     Blood Glucose Monitoring Suppl (CONTOUR NEXT ONE) KIT 1 each by Does  not apply route 2 (two) times daily. Check fasting blood sugar in morning and then recheck blood sugar after largest meal. 1 kit 0   Cholecalciferol (VITAMIN D3 PO) Take 500 mg by mouth daily.     cyclobenzaprine (FLEXERIL) 10 MG tablet TAKE 1 TABLET BY MOUTH DAILY AS NEEDED FOR MUSCLE SPASMS 60 tablet 0   ezetimibe (ZETIA) 10 MG tablet TAKE 1 TABLET BY MOUTH DAILY 90 tablet 0   Ginseng 100 MG CAPS Take by mouth.     glucose blood test strip Use to check blood sugars three times daily 300 each 4   Insulin Pen Needle (PEN NEEDLES 31GX5/16") 31G X 8 MM MISC 1 each by Does not apply route daily. Use one needle daily with Victoza injection 100  each 0   JARDIANCE 25 MG TABS tablet TAKE 1 TABLET(25 MG) BY MOUTH DAILY BEFORE AND BREAKFAST 90 tablet 0   metFORMIN (GLUCOPHAGE) 1000 MG tablet TAKE 1 TABLET(1000 MG) BY MOUTH TWICE DAILY WITH A MEAL 180 tablet 0   methocarbamol (ROBAXIN) 500 MG tablet TAKE 1 TABLET(500 MG) BY MOUTH EVERY 6 HOURS AS NEEDED 40 tablet 1   Multiple Vitamin (MULTIVITAMIN) tablet Take 1 tablet by mouth daily.     Probiotic Product (PROBIOTIC DAILY PO) Take 1 tablet by mouth daily.     rosuvastatin (CRESTOR) 5 MG tablet TAKE 1 TABLET(5 MG) BY MOUTH DAILY 60 tablet 0   sertraline (ZOLOFT) 50 MG tablet TAKE 1 TABLET(50 MG) BY MOUTH DAILY 30 tablet 2   triamcinolone cream (KENALOG) 0.1 % Apply 1 application topically 2 (two) times daily. 30 g 0   Turmeric (QC TUMERIC COMPLEX PO) Take by mouth.     lisinopril (ZESTRIL) 40 MG tablet TAKE 1 TABLET(40 MG) BY MOUTH DAILY 90 tablet 0   celecoxib (CELEBREX) 400 MG capsule Take 1 capsule (400 mg total) by mouth daily as needed for pain. (Patient not taking: Reported on 01/24/2021) 30 capsule 3   No facility-administered medications prior to visit.    Allergies  Allergen Reactions   Cetirizine Shortness Of Breath   Liraglutide Other (See Comments)    Severe pancreatitis   Onglyza [Saxagliptin] Other (See Comments)    Potential side effect of pancreatitis    ROS Review of Systems A fourteen system review of systems was performed and found to be positive as per HPI.   Objective:    Physical Exam General:  Well Developed, well nourished, appropriate for stated age.  Neuro:  Alert and oriented,  extra-ocular muscles intact  HEENT:  Normocephalic, atraumatic, neck supple Skin:  no gross rash, warm, pink. Cardiac:  RRR, S1 S2 Respiratory:  ECTA B/L and A/P, Not using accessory muscles, speaking in full sentences- unlabored. Vascular:  Ext warm, no cyanosis apprec.; cap RF less 2 sec. Psych:  No HI/SI, judgement and insight good, Euthymic mood. Full Affect.   BP  104/71   Pulse 90   Temp 98.2 F (36.8 C)   Ht 6' 1"  (1.854 m)   Wt 296 lb 11.2 oz (134.6 kg)   SpO2 98%   BMI 39.14 kg/m  Wt Readings from Last 3 Encounters:  01/24/21 296 lb 11.2 oz (134.6 kg)  10/25/20 (!) 301 lb 11.2 oz (136.9 kg)  06/14/20 (!) 316 lb (143.3 kg)     Health Maintenance Due  Topic Date Due   COVID-19 Vaccine (1) Never done   HIV Screening  Never done   Hepatitis C Screening  Never done   COLONOSCOPY (  Pts 45-37yr Insurance coverage will need to be confirmed)  Never done   Pneumococcal Vaccine 04963Years old (2 - PCV) 06/21/2018   OPHTHALMOLOGY EXAM  09/03/2018   FOOT EXAM  07/16/2019    There are no preventive care reminders to display for this patient.  Lab Results  Component Value Date   TSH 2.870 04/19/2020   Lab Results  Component Value Date   WBC 9.0 04/19/2020   HGB 16.8 04/19/2020   HCT 48.1 04/19/2020   MCV 89 04/19/2020   PLT 251 04/19/2020   Lab Results  Component Value Date   NA 136 04/19/2020   K 4.9 04/19/2020   CO2 22 04/19/2020   GLUCOSE 162 (H) 04/19/2020   BUN 14 04/19/2020   CREATININE 1.24 04/19/2020   BILITOT 0.8 04/19/2020   ALKPHOS 68 04/19/2020   AST 22 04/19/2020   ALT 43 04/19/2020   PROT 6.7 04/19/2020   ALBUMIN 4.6 04/19/2020   CALCIUM 9.5 04/19/2020   ANIONGAP 11 07/18/2019   Lab Results  Component Value Date   CHOL 130 04/19/2020   Lab Results  Component Value Date   HDL 32 (L) 04/19/2020   Lab Results  Component Value Date   LDLCALC 42 04/19/2020   Lab Results  Component Value Date   TRIG 383 (H) 04/19/2020   Lab Results  Component Value Date   CHOLHDL 4.1 04/19/2020   Lab Results  Component Value Date   HGBA1C 6.9 (A) 10/25/2020      Assessment & Plan:   Problem List Items Addressed This Visit       Cardiovascular and Mediastinum   Hypertension associated with diabetes ([place]   Relevant Medications   lisinopril (ZESTRIL) 20 MG tablet     Endocrine   Hyperlipidemia associated  with type 2 diabetes mellitus (HCC)   Relevant Medications   lisinopril (ZESTRIL) 20 MG tablet   Other Visit Diagnoses     Controlled type 2 diabetes mellitus with other specified complication, without long-term current use of insulin (HCC)    -  Primary   Relevant Medications   lisinopril (ZESTRIL) 20 MG tablet   Depression, major, single episode, mild (HCC)       Osteoarthritis, unspecified osteoarthritis type, unspecified site       Relevant Medications   ibuprofen (ADVIL) 800 MG tablet      Controlled type 2 diabetes mellitus with other specified complication, without long-term current use of insulin: -A1c today 6.6, improved from 6.9.  Encouraged to continue weight loss efforts and monitoring carbohydrate/glucose intake.  Continue Jardiance and metformin. -Will continue to monitor.  Hypertension associated with diabetes: -Soft blood pressure today and patient reports dizziness with positional change so will decrease lisinopril to 20 mg.  Advised to start taking 0.5 tablet of Lisinopril 40 mg and start monitoring BP at home. Advised to let me know if BP consistently >130/80 or <100/60. Continue amlodipine 5 mg. -Recommend to stay well hydrated and continue weight loss efforts.  Hyperlipidemia associated with type 2 diabetes mellitus: -Total cholesterol 130, triglycerides 383, HDL 32, LDL 42. -Continue rosuvastatin and ezetimibe. -Continue weight loss efforts and recommend to follow a low-fat diet. -Will repeat lipid panel and hepatic function at follow-up visit.  Depression, major, single episode, mild: -PHQ-9 score 4, stable. -Continue current medication regimen. -Will continue to monitor.  Osteoarthritis: - Reviewed orthopedics consult and imaging studies. Patient has failed relafen, celebrex and meloxicam. Will provide rx for ibuprofen 800 mg to take BID as needed  for moderate pain relief. Discussed with patient potential side effects with NSAIDs. -Recommend to continue  with PT. -Recommend obtaining b/l foot xray, will further discuss at follow up visit.   Meds ordered this encounter  Medications   ibuprofen (ADVIL) 800 MG tablet    Sig: Take 1 tablet (800 mg total) by mouth 2 (two) times daily as needed for moderate pain.    Dispense:  30 tablet    Refill:  1    Order Specific Question:   Supervising Provider    Answer:   Beatrice Lecher D [[id number]]   lisinopril (ZESTRIL) 20 MG tablet    Sig: Take 1 tablet (20 mg total) by mouth daily.    Dispense:  90 tablet    Refill:  0    Order Specific Question:   Supervising Provider    Answer:   Beatrice Lecher D [[id number]]    Follow-up: Return for DM, HTN, HLD and FBW in 3-4 months.   Note:  This note was prepared with assistance of Dragon voice recognition software. Occasional wrong-word or sound-a-like substitutions may have occurred due to the inherent limitations of voice recognition software.   Lorrene Reid, PA-C

## 2021-01-26 ENCOUNTER — Encounter: Payer: Self-pay | Admitting: Physician Assistant

## 2021-01-26 MED ORDER — IBUPROFEN 800 MG PO TABS: 800.0000 mg | ORAL_TABLET | Freq: Two times a day (BID) | ORAL | 1 refills | Status: DC | PRN

## 2021-01-29 MED ORDER — LISINOPRIL 20 MG PO TABS: 20.0000 mg | ORAL_TABLET | Freq: Every day | ORAL | 0 refills | Status: DC

## 2021-02-08 ENCOUNTER — Other Ambulatory Visit: Payer: Self-pay | Admitting: Physician Assistant

## 2021-02-08 DIAGNOSIS — E1169 Type 2 diabetes mellitus with other specified complication: Secondary | ICD-10-CM

## 2021-02-14 ENCOUNTER — Other Ambulatory Visit: Payer: Self-pay | Admitting: Physician Assistant

## 2021-02-14 DIAGNOSIS — I152 Hypertension secondary to endocrine disorders: Secondary | ICD-10-CM

## 2021-02-14 DIAGNOSIS — E1159 Type 2 diabetes mellitus with other circulatory complications: Secondary | ICD-10-CM

## 2021-03-15 ENCOUNTER — Other Ambulatory Visit: Payer: Self-pay | Admitting: Physician Assistant

## 2021-03-15 DIAGNOSIS — E782 Mixed hyperlipidemia: Secondary | ICD-10-CM

## 2021-03-15 DIAGNOSIS — F320 Major depressive disorder, single episode, mild: Secondary | ICD-10-CM

## 2021-03-15 DIAGNOSIS — E1169 Type 2 diabetes mellitus with other specified complication: Secondary | ICD-10-CM

## 2021-03-16 ENCOUNTER — Other Ambulatory Visit: Payer: Self-pay | Admitting: Physician Assistant

## 2021-03-16 DIAGNOSIS — M1990 Unspecified osteoarthritis, unspecified site: Secondary | ICD-10-CM

## 2021-03-22 ENCOUNTER — Other Ambulatory Visit: Payer: Self-pay | Admitting: Physician Assistant

## 2021-03-22 DIAGNOSIS — M1990 Unspecified osteoarthritis, unspecified site: Secondary | ICD-10-CM

## 2021-03-22 MED ORDER — IBUPROFEN 800 MG PO TABS: 800.0000 mg | ORAL_TABLET | Freq: Two times a day (BID) | ORAL | 1 refills | Status: DC | PRN

## 2021-03-22 NOTE — Telephone Encounter (Signed)
Patient requesting refill of Ibuprofen '800mg'$   Osteoarthritis: - Reviewed orthopedics consult and imaging studies. Patient has failed relafen, celebrex and meloxicam. Will provide rx for ibuprofen 800 mg to take BID as needed for moderate pain relief. Discussed with patient potential side effects with NSAIDs. -Recommend to continue with PT. -Recommend obtaining b/l foot xray, will further discuss at follow up visit.    Last refill given 01/26/21 #30 with 1 refill. AS, CMA

## 2021-04-28 ENCOUNTER — Other Ambulatory Visit: Payer: Self-pay | Admitting: Physician Assistant

## 2021-04-28 DIAGNOSIS — M545 Low back pain, unspecified: Secondary | ICD-10-CM

## 2021-05-13 ENCOUNTER — Other Ambulatory Visit: Payer: Self-pay | Admitting: Physician Assistant

## 2021-05-13 DIAGNOSIS — E782 Mixed hyperlipidemia: Secondary | ICD-10-CM

## 2021-05-13 DIAGNOSIS — E1169 Type 2 diabetes mellitus with other specified complication: Secondary | ICD-10-CM

## 2021-05-14 ENCOUNTER — Other Ambulatory Visit: Payer: Self-pay | Admitting: Physician Assistant

## 2021-05-14 DIAGNOSIS — E1159 Type 2 diabetes mellitus with other circulatory complications: Secondary | ICD-10-CM

## 2021-05-14 DIAGNOSIS — I152 Hypertension secondary to endocrine disorders: Secondary | ICD-10-CM

## 2021-05-16 ENCOUNTER — Other Ambulatory Visit: Payer: Self-pay | Admitting: Physician Assistant

## 2021-05-16 DIAGNOSIS — I152 Hypertension secondary to endocrine disorders: Secondary | ICD-10-CM

## 2021-06-09 ENCOUNTER — Other Ambulatory Visit: Payer: Self-pay | Admitting: Physician Assistant

## 2021-06-09 DIAGNOSIS — M545 Low back pain, unspecified: Secondary | ICD-10-CM

## 2021-06-10 ENCOUNTER — Other Ambulatory Visit: Payer: Self-pay | Admitting: Physician Assistant

## 2021-06-13 ENCOUNTER — Other Ambulatory Visit: Payer: Self-pay | Admitting: Physician Assistant

## 2021-06-13 DIAGNOSIS — E1169 Type 2 diabetes mellitus with other specified complication: Secondary | ICD-10-CM

## 2021-06-13 DIAGNOSIS — I152 Hypertension secondary to endocrine disorders: Secondary | ICD-10-CM

## 2021-06-13 DIAGNOSIS — M1990 Unspecified osteoarthritis, unspecified site: Secondary | ICD-10-CM

## 2021-06-13 DIAGNOSIS — F320 Major depressive disorder, single episode, mild: Secondary | ICD-10-CM

## 2021-06-15 ENCOUNTER — Ambulatory Visit (INDEPENDENT_AMBULATORY_CARE_PROVIDER_SITE_OTHER): Payer: BC Managed Care – PPO | Admitting: Physician Assistant

## 2021-06-15 ENCOUNTER — Encounter: Payer: Self-pay | Admitting: Physician Assistant

## 2021-06-15 ENCOUNTER — Other Ambulatory Visit: Payer: Self-pay

## 2021-06-15 VITALS — BP 126/82 | HR 87 | Temp 97.4°F | Ht 73.0 in | Wt 302.3 lb

## 2021-06-15 DIAGNOSIS — E1159 Type 2 diabetes mellitus with other circulatory complications: Secondary | ICD-10-CM

## 2021-06-15 DIAGNOSIS — E785 Hyperlipidemia, unspecified: Secondary | ICD-10-CM

## 2021-06-15 DIAGNOSIS — M1990 Unspecified osteoarthritis, unspecified site: Secondary | ICD-10-CM

## 2021-06-15 DIAGNOSIS — Z0000 Encounter for general adult medical examination without abnormal findings: Secondary | ICD-10-CM | POA: Diagnosis not present

## 2021-06-15 DIAGNOSIS — Z1211 Encounter for screening for malignant neoplasm of colon: Secondary | ICD-10-CM

## 2021-06-15 DIAGNOSIS — M545 Low back pain, unspecified: Secondary | ICD-10-CM

## 2021-06-15 DIAGNOSIS — I152 Hypertension secondary to endocrine disorders: Secondary | ICD-10-CM

## 2021-06-15 DIAGNOSIS — G8929 Other chronic pain: Secondary | ICD-10-CM

## 2021-06-15 DIAGNOSIS — E1169 Type 2 diabetes mellitus with other specified complication: Secondary | ICD-10-CM

## 2021-06-15 DIAGNOSIS — F320 Major depressive disorder, single episode, mild: Secondary | ICD-10-CM

## 2021-06-15 LAB — POCT GLYCOSYLATED HEMOGLOBIN (HGB A1C): Hemoglobin A1C: 6.4 % — AB (ref 4.0–5.6)

## 2021-06-15 MED ORDER — CYCLOBENZAPRINE HCL 10 MG PO TABS: 10.0000 mg | ORAL_TABLET | Freq: Every evening | ORAL | 1 refills | Status: DC | PRN

## 2021-06-15 MED ORDER — SERTRALINE HCL 50 MG PO TABS: 100.0000 mg | ORAL_TABLET | Freq: Every day | ORAL | 0 refills | Status: DC

## 2021-06-15 NOTE — Patient Instructions (Signed)

## 2021-06-15 NOTE — Progress Notes (Signed)
Male physical   Impression and Recommendations:    1. Healthcare maintenance   2. Controlled type 2 diabetes mellitus with other specified complication, without long-term current use of insulin ([place])   3. Hyperlipidemia associated with type 2 diabetes mellitus ([place])   4. Hypertension associated with diabetes ([place])   5. Chronic bilateral low back pain without sciatica   6. Osteoarthritis, unspecified osteoarthritis type, unspecified site   7. Screening for colon cancer   8. Depression, major, single episode, mild (HCC)      1) Anticipatory Guidance: Skin CA prevention- recommend to use sunscreen when outside along with skin surveillance; eating a balanced and modest diet; physical activity at least 25 minutes per day or minimum of 150 min/ week moderate to intense activity.  2) Immunizations / Screenings / Labs:   All immunizations are up-to-date per recommendations or will be updated today if pt allows.    - Patient understands with dental and vision screens they will schedule independently.  - Will obtain CBC, CMP, HgA1c, Lipid panel, TSH. A1c has improved and within goal.  - Pt agreeable to colonoscopy. Declined Hep C and HIV screenings. - Discussed scheduling diabetic eye exam.  3) Weight: Recommend to improve diet habits to improve overall feelings of well being and objective health data. Improve nutrient density of diet through increasing intake of fruits and vegetables and decreasing saturated fats, white flour products and refined sugars.   4) Healthcare Maintenance: -Patient has chronic back pain, DDD and OA. Reports takes muscle relaxer and ibuprofen daily to help with symptoms but they seem to be getting worse and concerned about interference with his job and daily activities. Will place referral to pain management. Discussed GI protection by taking H2 blocker with chronic use if NSAID, patient states taking Tums instead due to concerns of side effects with H2 blocker.  Patient plans to pursue second Orthopedic opinion and will let me know if needs referral. -Patient inquired about benzodiazepine therapy (Xanax) and discussed office controlled substance policy for benzodiazepine therapy, risks vs benefits and declined at this time. Will increase sertraline to 100 mg. Advised to start taking 2 tablets of 50 mg and left me know if dose effective to send in a new rx. -Follow up in 4 months for DM, HTN, HLD, mood-inc med   Orders Placed This Encounter  Procedures   Urine Microalbumin w/creat. ratio   CBC   Comprehensive metabolic panel    Order Specific Question:   Has the patient fasted?    Answer:   Yes   TSH   Lipid panel    Order Specific Question:   Has the patient fasted?    Answer:   Yes   Ambulatory referral to Physical Medicine Rehab    Referral Priority:   Routine    Referral Type:   Rehabilitation    Referral Reason:   Specialty Services Required    Requested Specialty:   Physical Medicine and Rehabilitation    Number of Visits Requested:   1   Ambulatory referral to Gastroenterology    Referral Priority:   Routine    Referral Type:   Consultation    Referral Reason:   Specialty Services Required    Number of Visits Requested:   1   POCT glycosylated hemoglobin (Hb A1C)    Associate with Z13.1     Meds ordered this encounter  Medications   cyclobenzaprine (FLEXERIL) 10 MG tablet    Sig: Take 1 tablet (10  mg total) by mouth at bedtime as needed for muscle spasms.    Dispense:  60 tablet    Refill:  1    Order Specific Question:   Supervising Provider    Answer:   Beatrice Lecher D [[id number]]   sertraline (ZOLOFT) 50 MG tablet    Sig: Take 2 tablets (100 mg total) by mouth daily.    Dispense:  30 tablet    Refill:  0      Return in about 4 months (around 10/14/2021) for DM, HTN, HLD, mood- inc med.     Gross side effects, risk and benefits, and alternatives of medications discussed with patient.  Patient is aware that all  medications have potential side effects and we are unable to predict every side effect or drug-drug interaction that may occur.  Expresses verbal understanding and consents to current therapy plan and treatment regimen.  Please see AVS handed out to patient at the end of our visit for further patient instructions/ counseling done pertaining to today's office visit.       Subjective:        CC: CPE   HPI: Eddie Hancock is a 50 y.o. male who presents to [place] at [facility] today for a yearly health maintenance exam.     Health Maintenance Summary  - Reviewed and updated, unless pt declines services.  Last Cologuard or Colonoscopy:   placed order for screening colonoscopy   Alcohol / drug use:   occasional use / no use Exercise Habits:  no routine exercise regimen Eye exams: not UTD Male history: STD concerns:   none Additional penile/ urinary concerns: none   Additional concerns beyond Health Maintenance issues: Worsening chronic back pain and neck pain, inquiring about referral to pain management. Taking flexeril and ibuprofen daily. Also reports feeling less motivated and sertraline 50 mg isn't as effective as it initially was. Also states his wife thinks he should be on Xanax.    Immunization History  Administered Date(s) Administered   Influenza Split 04/16/2020   Influenza,inj,Quad PF,6+ Mos 03/18/2021   Influenza,inj,Quad PF,6-35 Mos 03/07/2019   Influenza,inj,quad, With Preservative 04/13/2016   Influenza-Unspecified 04/23/2017   Pneumococcal Polysaccharide-23 06/21/2017   Tdap 10/12/2014     Health Maintenance  Topic Date Due   COVID-19 Vaccine (1) Never done   HIV Screening  Never done   Hepatitis C Screening  Never done   COLONOSCOPY (Pts 45-59yr Insurance coverage will need to be confirmed)  Never done   Pneumococcal Vaccine [id number] old (2 - PCV) 06/21/2018   OPHTHALMOLOGY EXAM  09/03/2018   FOOT EXAM  07/16/2019   HEMOGLOBIN  A1C  04/26/2021   TETANUS/TDAP  10/11/2024   INFLUENZA VACCINE  Completed   HPV VACCINES  Aged Out       Wt Readings from Last 3 Encounters:  06/15/21 (!) 302 lb 4.8 oz (137.1 kg)  01/24/21 296 lb 11.2 oz (134.6 kg)  10/25/20 (!) 301 lb 11.2 oz (136.9 kg)   BP Readings from Last 3 Encounters:  06/15/21 126/82  01/24/21 104/71  10/25/20 131/86   Pulse Readings from Last 3 Encounters:  06/15/21 87  01/24/21 90  10/25/20 (!) 104    Patient Active Problem List   Diagnosis Date Noted   Hypertension associated with diabetes ([place] 09/11/2019   Mixed diabetic hyperlipidemia associated with type 2 diabetes mellitus ([place] 09/11/2019   Acute pancreatitis 09/11/2019   BMI 40.0-44.9, adult ([place] 07/15/2018   Morbid obesity (  [place]) 07/15/2018   Insomnia 11/29/2017   Diabetes mellitus without complication ([place]) 30/13/1438   Hyperlipidemia associated with type 2 diabetes mellitus ([place]) 06/21/2017   Healthcare maintenance 06/21/2017   Contact dermatitis 10/17/2016   Benign essential hypertension 11/04/2015   Chronic low back pain without sciatica 11/04/2015   Hyperglycemia 11/04/2015   Hypertriglyceridemia 11/04/2015   Seasonal allergic rhinitis due to pollen 11/04/2015    Past Medical History:  Diagnosis Date   Diabetes mellitus without complication ([place])    GERD (gastroesophageal reflux disease)    Hypertension    Obesity     Past Surgical History:  Procedure Laterality Date   APPENDECTOMY      Family History  Problem Relation Age of Onset   Hypertension Mother    Diabetes Daughter    Learning disabilities Maternal Grandmother    Learning disabilities Maternal Grandfather     Social History   Substance and Sexual Activity  Drug Use No  ,  Social History   Substance and Sexual Activity  Alcohol Use No  ,  Social History   Tobacco Use  Smoking Status Former   Types: Cigarettes   Quit date: 11/07/2016   Years since quitting: 4.6  Smokeless Tobacco Never  ,   Social History   Substance and Sexual Activity  Sexual Activity Yes   Birth control/protection: None    Patient's Medications  New Prescriptions   No medications on file  Previous Medications   ALLOPURINOL (ZYLOPRIM) 300 MG TABLET    TAKE 1 TABLET(300 MG) BY MOUTH DAILY   AMLODIPINE (NORVASC) 5 MG TABLET    TAKE 1 TABLET(5 MG) BY MOUTH DAILY   BLOOD GLUCOSE MONITORING SUPPL (CONTOUR NEXT ONE) KIT    1 each by Does not apply route 2 (two) times daily. Check fasting blood sugar in morning and then recheck blood sugar after largest meal.   CHOLECALCIFEROL (VITAMIN D3 PO)    Take 500 mg by mouth daily.   EZETIMIBE (ZETIA) 10 MG TABLET    TAKE 1 TABLET BY MOUTH DAILY   GINSENG 100 MG CAPS    Take by mouth.   GLUCOSE BLOOD TEST STRIP    Use to check blood sugars three times daily   IBUPROFEN (ADVIL) 800 MG TABLET    TAKE 1 TABLET(800 MG) BY MOUTH TWICE DAILY AS NEEDED FOR MODERATE PAIN   INSULIN PEN NEEDLE (PEN NEEDLES 31GX5/16") 31G X 8 MM MISC    1 each by Does not apply route daily. Use one needle daily with Victoza injection   JARDIANCE 25 MG TABS TABLET    TAKE 1 TABLET(25 MG) BY MOUTH DAILY BEFORE BREAKFAST   LISINOPRIL (ZESTRIL) 20 MG TABLET    Take 1 tablet (20 mg total) by mouth daily.   LISINOPRIL (ZESTRIL) 20 MG TABLET    TAKE 1 TABLET(20 MG) BY MOUTH DAILY   METFORMIN (GLUCOPHAGE) 1000 MG TABLET    TAKE 1 TABLET(1000 MG) BY MOUTH TWICE DAILY WITH A MEAL   METHOCARBAMOL (ROBAXIN) 500 MG TABLET    TAKE 1 TABLET(500 MG) BY MOUTH EVERY 6 HOURS AS NEEDED   MULTIPLE VITAMIN (MULTIVITAMIN) TABLET    Take 1 tablet by mouth daily.   PROBIOTIC PRODUCT (PROBIOTIC DAILY PO)    Take 1 tablet by mouth daily.   ROSUVASTATIN (CRESTOR) 5 MG TABLET    TAKE 1 TABLET(5 MG) BY MOUTH DAILY   TRIAMCINOLONE CREAM (KENALOG) 0.1 %    Apply 1 application topically 2 (two) times daily.  TURMERIC (QC TUMERIC COMPLEX PO)    Take by mouth.  Modified Medications   Modified Medication Previous Medication    CYCLOBENZAPRINE (FLEXERIL) 10 MG TABLET cyclobenzaprine (FLEXERIL) 10 MG tablet      Take 1 tablet (10 mg total) by mouth at bedtime as needed for muscle spasms.    TAKE 1 TABLET BY MOUTH DAILY AS NEEDED FOR MUSCLE SPASMS   SERTRALINE (ZOLOFT) 50 MG TABLET sertraline (ZOLOFT) 50 MG tablet      Take 2 tablets (100 mg total) by mouth daily.    Take 1 tablet (50 mg total) by mouth daily.  Discontinued Medications   B COMPLEX VITAMINS CAPSULE    Take 1 capsule by mouth daily.    Cetirizine, Liraglutide, and Onglyza [saxagliptin]  Review of Systems: General:   Denies fever, chills, unexplained weight loss.  Optho/Auditory:   Denies visual changes, blurred vision/LOV Respiratory:   Denies SOB, DOE more than baseline levels.   Cardiovascular:   Denies chest pain, palpitations, new onset peripheral edema  Gastrointestinal:   Denies nausea, vomiting, diarrhea.  Genitourinary: Denies dysuria, freq/ urgency, flank pain  Endocrine:     Denies hot or cold intolerance, polyuria, polydipsia. Musculoskeletal:   Denies unexplained myalgias, joint swelling, gait problems, +arthralgias.  Skin:  Denies rash, suspicious lesions Neurological:     Denies dizziness, unexplained weakness, numbness  Psychiatric/Behavioral:   Denies mood changes, suicidal or homicidal ideations, hallucinations    Objective:     Blood pressure 126/82, pulse 87, temperature (!) 97.4 F (36.3 C), height 6' 1"  (1.854 m), weight (!) 302 lb 4.8 oz (137.1 kg), SpO2 99 %. Body mass index is 39.88 kg/m. General Appearance:    Alert, cooperative, no distress, appears stated age  Head:    Normocephalic, without obvious abnormality, atraumatic  Eyes:    PERRL, conjunctiva/corneas clear, EOM's intact, fundi    benign, both eyes  Ears:    Normal TM's and external ear canals, both ears  Nose:   Nares normal, septum midline, mucosa normal, no drainage    or sinus tenderness  Throat:   Lips w/o lesion, mucosa moist, and tongue normal;  teeth and gums fair  Neck:   Supple, symmetrical, trachea midline, no adenopathy;    thyroid:  no enlargement/tenderness/nodules  Back:     Symmetric, no curvature, ROM normal, no CVA tenderness, tenderness of low back  Lungs:     Clear to auscultation bilaterally, respirations unlabored, no  Wh/ R/ R  Chest Wall:    No tenderness or gross deformity; normal excursion   Heart:    Regular rate and rhythm, S1 and S2 normal, no murmur, rub   or gallop  Abdomen:     Soft, non-tender, bowel sounds active all four quadrants, No G/R/R, no masses, no organomegaly  Genitalia:   Deferred  Rectal:   Deferred  Extremities:   Extremities normal, atraumatic, no cyanosis or gross edema  Pulses:   2+ and symmetric all extremities  Skin:   Warm, dry, Skin color, texture, turgor normal, dry scaly lesions of LE noted  M-Sk:   Ambulates * 4 w/o difficulty, no gross deformities, tone WNL  Neurologic:   CNII-XII grossly intact Psych:  No HI/SI, judgement and insight good, Euthymic mood. Full Affect.

## 2021-06-16 LAB — CBC
Hematocrit: 49.9 % (ref 37.5–51.0)
Hemoglobin: 16.8 g/dL (ref 13.0–17.7)
MCH: 28.8 pg (ref 26.6–33.0)
MCHC: 33.7 g/dL (ref 31.5–35.7)
MCV: 86 fL (ref 79–97)
Platelets: 256 10*3/uL (ref 150–450)
RBC: 5.83 x10E6/uL — ABNORMAL HIGH (ref 4.14–5.80)
RDW: 12.3 % (ref 11.6–15.4)
WBC: 8.7 10*3/uL (ref 3.4–10.8)

## 2021-06-16 LAB — COMPREHENSIVE METABOLIC PANEL
ALT: 29 IU/L (ref 0–44)
AST: 18 IU/L (ref 0–40)
Albumin/Globulin Ratio: 3.1 — ABNORMAL HIGH (ref 1.2–2.2)
Albumin: 5.2 g/dL — ABNORMAL HIGH (ref 4.0–5.0)
Alkaline Phosphatase: 76 IU/L (ref 44–121)
BUN/Creatinine Ratio: 21 — ABNORMAL HIGH (ref 9–20)
BUN: 22 mg/dL (ref 6–24)
Bilirubin Total: 0.9 mg/dL (ref 0.0–1.2)
CO2: 18 mmol/L — ABNORMAL LOW (ref 20–29)
Calcium: 9 mg/dL (ref 8.7–10.2)
Chloride: 104 mmol/L (ref 96–106)
Creatinine, Ser: 1.05 mg/dL (ref 0.76–1.27)
Globulin, Total: 1.7 g/dL (ref 1.5–4.5)
Glucose: 127 mg/dL — ABNORMAL HIGH (ref 70–99)
Potassium: 4.9 mmol/L (ref 3.5–5.2)
Sodium: 139 mmol/L (ref 134–144)
Total Protein: 6.9 g/dL (ref 6.0–8.5)
eGFR: 87 mL/min/{1.73_m2} (ref >59)

## 2021-06-16 LAB — MICROALBUMIN / CREATININE URINE RATIO
Creatinine, Urine: 78.8 mg/dL
Microalb/Creat Ratio: <4 mg/g creat (ref 0–29)
Microalbumin, Urine: <3 ug/mL

## 2021-06-16 LAB — TSH: TSH: 1.86 u[IU]/mL (ref 0.450–4.500)

## 2021-06-16 LAB — LIPID PANEL
Chol/HDL Ratio: 2.8 ratio (ref 0.0–5.0)
Cholesterol, Total: 88 mg/dL — ABNORMAL LOW (ref 100–199)
HDL: 31 mg/dL — ABNORMAL LOW (ref >39)
LDL Chol Calc (NIH): 26 mg/dL (ref 0–99)
Triglycerides: 194 mg/dL — ABNORMAL HIGH (ref 0–149)
VLDL Cholesterol Cal: 31 mg/dL (ref 5–40)

## 2021-08-12 ENCOUNTER — Other Ambulatory Visit: Payer: Self-pay | Admitting: Physician Assistant

## 2021-08-12 DIAGNOSIS — E782 Mixed hyperlipidemia: Secondary | ICD-10-CM

## 2021-08-12 DIAGNOSIS — E1169 Type 2 diabetes mellitus with other specified complication: Secondary | ICD-10-CM

## 2021-08-19 ENCOUNTER — Other Ambulatory Visit: Payer: Self-pay | Admitting: Physician Assistant

## 2021-08-19 DIAGNOSIS — E1169 Type 2 diabetes mellitus with other specified complication: Secondary | ICD-10-CM

## 2021-08-19 DIAGNOSIS — E1159 Type 2 diabetes mellitus with other circulatory complications: Secondary | ICD-10-CM

## 2021-08-30 ENCOUNTER — Other Ambulatory Visit: Payer: Self-pay | Admitting: Physician Assistant

## 2021-08-30 DIAGNOSIS — M1990 Unspecified osteoarthritis, unspecified site: Secondary | ICD-10-CM

## 2021-09-08 ENCOUNTER — Other Ambulatory Visit: Payer: Self-pay | Admitting: Physician Assistant

## 2021-09-08 DIAGNOSIS — F320 Major depressive disorder, single episode, mild: Secondary | ICD-10-CM

## 2021-09-09 ENCOUNTER — Other Ambulatory Visit: Payer: Self-pay | Admitting: Physician Assistant

## 2021-10-01 ENCOUNTER — Other Ambulatory Visit: Payer: Self-pay

## 2021-10-01 ENCOUNTER — Encounter: Payer: Self-pay | Admitting: *Deleted

## 2021-10-01 ENCOUNTER — Ambulatory Visit
Admission: EM | Admit: 2021-10-01 | Discharge: 2021-10-01 | Disposition: A | Discharge status: Home / Self Care | Payer: BC Managed Care – PPO | Attending: Physician Assistant | Admitting: Physician Assistant

## 2021-10-01 DIAGNOSIS — L739 Follicular disorder, unspecified: Secondary | ICD-10-CM | POA: Diagnosis not present

## 2021-10-01 MED ORDER — DOXYCYCLINE HYCLATE 100 MG PO CAPS
100.0000 mg | ORAL_CAPSULE | Freq: Two times a day (BID) | ORAL | 0 refills | Status: DC
Start: 2021-10-01 — End: 2021-11-10

## 2021-10-01 NOTE — ED Triage Notes (Signed)
Pt reports having "what I thought were ingrown hairs in my beard line" over past month; started yesterday with right facial swelling and pain and a couple lesions to right cheek. Denies fevers. ?

## 2021-10-01 NOTE — Discharge Instructions (Signed)
Take doxycycline 100 mg twice daily for 10 days.  Use warm compresses on this area.  Make sure to clean your face regularly and I do recommend using a product that contains salicylic acid which is available over-the-counter.  Make sure to replace your razor including a clippers prior to beginning grooming routine again.  If your symptoms or not improving within a few days please return for reevaluation.  If any point anything worsens and the lesion becomes larger, you develop fever, nausea, vomiting you need to be seen immediately. ?

## 2021-10-01 NOTE — ED Provider Notes (Signed)
?[place] ? ? ? ?CSN: [id number] ?Arrival date & time: 10/01/21  7893 ? ? ?  ? ?History   ?Chief Complaint ?Chief Complaint  ?Patient presents with  ? Facial Swelling  ? ? ?HPI ?Eddie Hancock is a 51 y.o. male.  ? ?Patient presents today with a several month history of intermittent bumps/lesions associated with his facial hair.  He reports that symptoms began after he switched from shaving with a clipper to using a razor.  Since that time he has had lesions pop up and disappear but recently 1 area has become significantly more painful and swollen.  This began approximately 2 days ago.  He reports the pain is rated 3 on a 0-10 pain scale if he is not touching it but increases significantly with palpation, described as intense soreness, no alleviating factors identified.  He denies history of recurrent skin infections or previous diagnosis of MRSA.  He denies any systemic symptoms including fever, nausea, vomiting, abdominal pain, body aches.  He does have a history of type 2 diabetes but reports blood sugars are adequately controlled; last A1c obtained December 2022 was appropriate at 6.4%. ? ? ?Past Medical History:  ?Diagnosis Date  ? Diabetes mellitus without complication ([place])   ? GERD (gastroesophageal reflux disease)   ? Hypertension   ? Obesity   ? ? ?Patient Active Problem List  ? Diagnosis Date Noted  ? Hypertension associated with diabetes ([place]) 09/11/2019  ? Mixed diabetic hyperlipidemia associated with type 2 diabetes mellitus ([place]) 09/11/2019  ? Acute pancreatitis 09/11/2019  ? BMI 40.0-44.9, adult ([place]) 07/15/2018  ? Morbid obesity ([place]) 07/15/2018  ? Insomnia 11/29/2017  ? Diabetes mellitus without complication ([place]) 81/07/7508  ? Hyperlipidemia associated with type 2 diabetes mellitus ([place]) 06/21/2017  ? Healthcare maintenance 06/21/2017  ? Contact dermatitis 10/17/2016  ? Benign essential hypertension 11/04/2015  ? Chronic low back pain without sciatica 11/04/2015  ? Hyperglycemia  11/04/2015  ? Hypertriglyceridemia 11/04/2015  ? Seasonal allergic rhinitis due to pollen 11/04/2015  ? ? ?Past Surgical History:  ?Procedure Laterality Date  ? APPENDECTOMY    ? ? ? ? ? ?Home Medications   ? ?Prior to Admission medications   ?Medication Sig Start Date End Date Taking? Authorizing Provider  ?allopurinol (ZYLOPRIM) 300 MG tablet TAKE 1 TABLET(300 MG) BY MOUTH DAILY 06/13/21  Yes Abonza, Maritza, PA-C  ?amLODipine (NORVASC) 5 MG tablet TAKE 1 TABLET(5 MG) BY MOUTH DAILY 08/19/21  Yes Lorrene Reid, PA-C  ?Cholecalciferol (VITAMIN D3 PO) Take 500 mg by mouth daily.   Yes [provider]  ?doxycycline (VIBRAMYCIN) 100 MG capsule Take 1 capsule (100 mg total) by mouth 2 (two) times daily. 10/01/21  Yes Chantille Navarrete, Junie Panning K, PA-C  ?ezetimibe (ZETIA) 10 MG tablet TAKE 1 TABLET BY MOUTH DAILY 08/19/21  Yes Lorrene Reid, PA-C  ?Ginseng 100 MG CAPS Take by mouth.   Yes [provider]  ?JARDIANCE 25 MG TABS tablet TAKE 1 TABLET(25 MG) BY MOUTH DAILY BEFORE BREAKFAST 06/13/21  Yes Abonza, Maritza, PA-C  ?lisinopril (ZESTRIL) 20 MG tablet Take 1 tablet (20 mg total) by mouth daily. 01/29/21  Yes Abonza, Herb Grays, PA-C  ?metFORMIN (GLUCOPHAGE) 1000 MG tablet TAKE 1 TABLET(1000 MG) BY MOUTH TWICE DAILY WITH A MEAL 06/13/21  Yes Lorrene Reid, PA-C  ?Multiple Vitamin (MULTIVITAMIN) tablet Take 1 tablet by mouth daily.   Yes [provider]  ?Probiotic Product (PROBIOTIC DAILY PO) Take 1 tablet by mouth daily.   Yes [provider]  ?  rosuvastatin (CRESTOR) 5 MG tablet TAKE 1 TABLET(5 MG) BY MOUTH DAILY 08/12/21  Yes Abonza, Maritza, PA-C  ?sertraline (ZOLOFT) 50 MG tablet TAKE 1 TABLET(50 MG) BY MOUTH DAILY 09/09/21  Yes Lorrene Reid, PA-C  ?Turmeric (QC TUMERIC COMPLEX PO) Take by mouth.   Yes [provider]  ?Blood Glucose Monitoring Suppl (CONTOUR NEXT ONE) KIT 1 each by Does not apply route 2 (two) times daily. Check fasting blood sugar in morning and then recheck blood  sugar after largest meal. 10/02/19   Opalski, Neoma Laming, DO  ?cyclobenzaprine (FLEXERIL) 10 MG tablet Take 1 tablet (10 mg total) by mouth at bedtime as needed for muscle spasms. 06/15/21   Lorrene Reid, PA-C  ?glucose blood test strip Use to check blood sugars three times daily 01/17/21   Abonza, Herb Grays, PA-C  ?ibuprofen (ADVIL) 800 MG tablet TAKE 1 TABLET(800 MG) BY MOUTH TWICE DAILY AS NEEDED FOR MODERATE PAIN 08/30/21   Lorrene Reid, PA-C  ?Insulin Pen Needle (PEN NEEDLES 31GX5/16") 31G X 8 MM MISC 1 each by Does not apply route daily. Use one needle daily with Victoza injection 06/10/19   Danford, Valetta Fuller D, NP  ?methocarbamol (ROBAXIN) 500 MG tablet TAKE 1 TABLET(500 MG) BY MOUTH EVERY 6 HOURS AS NEEDED 01/03/21   Mcarthur Rossetti, MD  ?triamcinolone cream (KENALOG) 0.1 % Apply 1 application topically 2 (two) times daily. 10/25/20   Lorrene Reid, PA-C  ?liraglutide (VICTOZA) 18 MG/3ML SOPN Inject 0.1 mLs (0.6 mg total) into the skin daily for 7 days, THEN 0.2 mLs (1.2 mg total) daily for 7 days, THEN 0.2 mLs (1.2 mg total) daily for 7 days. 04/29/19 07/18/19  Esaw Grandchild, NP  ? ? ?Family History ?Family History  ?Problem Relation Age of Onset  ? Hypertension Mother   ? Diabetes Daughter   ? Learning disabilities Maternal Grandmother   ? Learning disabilities Maternal Grandfather   ? ? ?Social History ?Social History  ? ?Tobacco Use  ? Smoking status: Former  ?  Types: Cigarettes  ?  Quit date: 11/07/2016  ?  Years since quitting: 4.9  ? Smokeless tobacco: Never  ?Vaping Use  ? Vaping Use: Never used  ?Substance Use Topics  ? Alcohol use: Yes  ?  Comment: rare  ? Drug use: No  ? ? ? ?Allergies   ?Cetirizine, Liraglutide, and Onglyza [saxagliptin] ? ? ?Review of Systems ?Review of Systems  ?Constitutional:  Positive for activity change. Negative for appetite change, fatigue and fever.  ?Respiratory:  Negative for cough and shortness of breath.   ?Cardiovascular:  Negative for chest pain.  ?Gastrointestinal:   Negative for abdominal pain, diarrhea, nausea and vomiting.  ?Skin:  Positive for color change and wound.  ?Neurological:  Negative for dizziness, light-headedness and headaches.  ? ? ?Physical Exam ?Triage Vital Signs ?ED Triage Vitals  ?Enc Vitals Group  ?   BP 10/01/21 0819 124/79  ?   Pulse Rate 10/01/21 0819 (!) 112  ?   Resp 10/01/21 0819 18  ?   Temp 10/01/21 0819 98.6 ?F (37 ?C)  ?   Temp Source 10/01/21 0819 Oral  ?   SpO2 10/01/21 0819 94 %  ?   Weight --   ?   Height --   ?   Head Circumference --   ?   Peak Flow --   ?   Pain Score 10/01/21 0821 3  ?   Pain Loc --   ?   Pain Edu? --   ?  Excl. in [place]? --   ? ?No data found. ? ?Updated Vital Signs ?BP 113/72   Pulse (!) 105   Temp 98.6 ?F (37 ?C) (Oral)   Resp 18   SpO2 94%  ? ?Visual Acuity ?Right Eye Distance:   ?Left Eye Distance:   ?Bilateral Distance:   ? ?Right Eye Near:   ?Left Eye Near:    ?Bilateral Near:    ? ?Physical Exam ?Vitals reviewed.  ?Constitutional:   ?   General: He is awake.  ?   Appearance: Normal appearance. He is well-developed. He is not ill-appearing.  ?   Comments: Very pleasant male appears stated age in no acute distress sitting comfortably in exam room  ?HENT:  ?   Head: Normocephalic and atraumatic.  ? ?   Comments: Small ulcerated lesion with surrounding erythema that is tender to touch.  No streaking or evidence of lymphangitis.  No active bleeding or drainage noted. ?   Mouth/Throat:  ?   Pharynx: No oropharyngeal exudate, posterior oropharyngeal erythema or uvula swelling.  ?Cardiovascular:  ?   Rate and Rhythm: Normal rate and regular rhythm.  ?   Heart sounds: Normal heart sounds, S1 normal and S2 normal. No murmur heard. ?Pulmonary:  ?   Effort: Pulmonary effort is normal.  ?   Breath sounds: Normal breath sounds. No stridor. No wheezing, rhonchi or rales.  ?   Comments: Clear to auscultation bilaterally ?Neurological:  ?   Mental Status: He is alert.  ?Psychiatric:     ?   Behavior: Behavior is cooperative.   ? ? ? ?UC Treatments / Results  ?Labs ?(all labs ordered are listed, but only abnormal results are displayed) ?Labs Reviewed - No data to display ? ?EKG ? ? ?Radiology ?No results found. ? ?Procedures ?Procedures (

## 2021-10-04 ENCOUNTER — Telehealth: Payer: Self-pay | Admitting: Physician Assistant

## 2021-10-04 ENCOUNTER — Other Ambulatory Visit: Payer: Self-pay

## 2021-10-04 DIAGNOSIS — F320 Major depressive disorder, single episode, mild: Secondary | ICD-10-CM

## 2021-10-04 MED ORDER — SERTRALINE HCL 100 MG PO TABS: ORAL_TABLET | ORAL | 0 refills | Status: DC

## 2021-10-04 NOTE — Telephone Encounter (Signed)
Refill sent and patient was advised.  ? ?

## 2021-10-04 NOTE — Telephone Encounter (Signed)
Patient left vm stating he needs refill of Zoloft he has ran out because it was increased to 100 mg instead of the 50. Please advise.  ?

## 2021-10-09 ENCOUNTER — Other Ambulatory Visit: Payer: Self-pay | Admitting: Physician Assistant

## 2021-10-09 DIAGNOSIS — F320 Major depressive disorder, single episode, mild: Secondary | ICD-10-CM

## 2021-10-14 ENCOUNTER — Other Ambulatory Visit: Payer: Self-pay | Admitting: Physician Assistant

## 2021-10-14 DIAGNOSIS — M545 Low back pain, unspecified: Secondary | ICD-10-CM

## 2021-10-17 ENCOUNTER — Ambulatory Visit (INDEPENDENT_AMBULATORY_CARE_PROVIDER_SITE_OTHER): Payer: BC Managed Care – PPO | Admitting: Physician Assistant

## 2021-10-17 ENCOUNTER — Encounter: Payer: Self-pay | Admitting: Physician Assistant

## 2021-10-17 VITALS — BP 118/75 | HR 96 | Temp 97.2°F | Ht 72.0 in | Wt 305.0 lb

## 2021-10-17 DIAGNOSIS — G8929 Other chronic pain: Secondary | ICD-10-CM

## 2021-10-17 DIAGNOSIS — F320 Major depressive disorder, single episode, mild: Secondary | ICD-10-CM

## 2021-10-17 DIAGNOSIS — I152 Hypertension secondary to endocrine disorders: Secondary | ICD-10-CM | POA: Diagnosis not present

## 2021-10-17 DIAGNOSIS — E785 Hyperlipidemia, unspecified: Secondary | ICD-10-CM

## 2021-10-17 DIAGNOSIS — M545 Low back pain, unspecified: Secondary | ICD-10-CM | POA: Diagnosis not present

## 2021-10-17 DIAGNOSIS — E1169 Type 2 diabetes mellitus with other specified complication: Secondary | ICD-10-CM

## 2021-10-17 DIAGNOSIS — E1159 Type 2 diabetes mellitus with other circulatory complications: Secondary | ICD-10-CM | POA: Diagnosis not present

## 2021-10-17 LAB — POCT GLYCOSYLATED HEMOGLOBIN (HGB A1C): Hemoglobin A1C: 6.9 % — AB (ref 4.0–5.6)

## 2021-10-17 NOTE — Assessment & Plan Note (Signed)
-  A1c mildly increased from 6.4 to 6.9, borderline at goal. Discussed with patient resuming diabetic diet. Continue ambulatory glucose monitoring. Discussed if A1c fails to improve then recommend treatment adjustments. Pt has hx of pancreatitis with GLP-1 therapy so will consider alternatives.  ?

## 2021-10-17 NOTE — Assessment & Plan Note (Signed)
>>  ASSESSMENT AND PLAN FOR HYPERLIPIDEMIA ASSOCIATED WITH TYPE 2 DIABETES MELLITUS (HCC) WRITTEN ON 10/17/2021  8:40 AM BY ABONZA, MARITZA, PA-C  -Last lipid panel: HDL 31, LDL 26 -Will repeat lipid panel and hepatic function today. -Continue current medication regimen. See med list. -Discussed low fat diet.

## 2021-10-17 NOTE — Assessment & Plan Note (Signed)
-  Last lipid panel: HDL 31, LDL 26 ?-Will repeat lipid panel and hepatic function today. ?-Continue current medication regimen. See med list. ?-Discussed low fat diet. ?

## 2021-10-17 NOTE — Progress Notes (Signed)
?Established patient visit ? ? ?Patient: Eddie Hancock   DOB: Oct 21, 1970   51 y.o. Male  MRN: [id number] ?Visit Date: 10/17/2021 ? ?Chief Complaint  ?Patient presents with  ? Follow-up  ? Diabetes  ? Hypertension  ? Hyperlipidemia  ? ?Subjective  ?  ?HPI  ?Patient presents for follow-up on diabetes mellitus, hypertension and hyperlipidemia. ?  ?Diabetes mellitus: Pt denies increased urination or thirst. Pt reports medication compliance. No hypoglycemic events. Checking glucose at home. FBS range from 110-140s. Patient reports has not been as good with monitoring starches and sweets.  ? ?HTN: Pt denies chest pain, palpitations, headache or lower extremity swelling. Taking medication as directed without side effects.  ? ?HLD: Pt taking medication as directed without issues. States diet has not been good.  ? ?Mood: Patient reports taking Sertraline 100 mg. Reports since dose was increased seems to be more involved with activities. Denies SI/HI. ? ? ?Medications: ?Outpatient Medications Prior to Visit  ?Medication Sig  ? allopurinol (ZYLOPRIM) 300 MG tablet TAKE 1 TABLET(300 MG) BY MOUTH DAILY  ? amLODipine (NORVASC) 5 MG tablet TAKE 1 TABLET(5 MG) BY MOUTH DAILY  ? Blood Glucose Monitoring Suppl (CONTOUR NEXT ONE) KIT 1 each by Does not apply route 2 (two) times daily. Check fasting blood sugar in morning and then recheck blood sugar after largest meal.  ? Cholecalciferol (VITAMIN D3 PO) Take 500 mg by mouth daily.  ? doxycycline (VIBRAMYCIN) 100 MG capsule Take 1 capsule (100 mg total) by mouth 2 (two) times daily.  ? ezetimibe (ZETIA) 10 MG tablet TAKE 1 TABLET BY MOUTH DAILY  ? Ginseng 100 MG CAPS Take by mouth.  ? glucose blood test strip Use to check blood sugars three times daily  ? ibuprofen (ADVIL) 800 MG tablet TAKE 1 TABLET(800 MG) BY MOUTH TWICE DAILY AS NEEDED FOR MODERATE PAIN  ? Insulin Pen Needle (PEN NEEDLES 31GX5/16") 31G X 8 MM MISC 1 each by Does not apply route daily. Use one needle daily with Victoza  injection  ? JARDIANCE 25 MG TABS tablet TAKE 1 TABLET(25 MG) BY MOUTH DAILY BEFORE BREAKFAST  ? lisinopril (ZESTRIL) 20 MG tablet Take 1 tablet (20 mg total) by mouth daily.  ? metFORMIN (GLUCOPHAGE) 1000 MG tablet TAKE 1 TABLET(1000 MG) BY MOUTH TWICE DAILY WITH A MEAL  ? Multiple Vitamin (MULTIVITAMIN) tablet Take 1 tablet by mouth daily.  ? Probiotic Product (PROBIOTIC DAILY PO) Take 1 tablet by mouth daily.  ? rosuvastatin (CRESTOR) 5 MG tablet TAKE 1 TABLET(5 MG) BY MOUTH DAILY  ? sertraline (ZOLOFT) 100 MG tablet TAKE 1 TABLET(50 MG) BY MOUTH DAILY  ? triamcinolone cream (KENALOG) 0.1 % Apply 1 application topically 2 (two) times daily.  ? Turmeric (QC TUMERIC COMPLEX PO) Take by mouth.  ? [DISCONTINUED] cyclobenzaprine (FLEXERIL) 10 MG tablet Take 1 tablet (10 mg total) by mouth at bedtime as needed for muscle spasms.  ? [DISCONTINUED] methocarbamol (ROBAXIN) 500 MG tablet TAKE 1 TABLET(500 MG) BY MOUTH EVERY 6 HOURS AS NEEDED  ? ?No facility-administered medications prior to visit.  ? ? ?Review of Systems ?Review of Systems:  ?A fourteen system review of systems was performed and found to be positive as per HPI. ? ? ?  Objective  ?  ?BP 118/75   Pulse 96   Temp (!) 97.2 ?F (36.2 ?C)   Ht 6' (1.829 m)   Wt (!) 305 lb (138.3 kg)   SpO2 98%   BMI 41.37 kg/m?  ?BP Readings from  Last 3 Encounters:  ?10/17/21 118/75  ?10/01/21 113/72  ?06/15/21 126/82  ? ?Wt Readings from Last 3 Encounters:  ?10/17/21 (!) 305 lb (138.3 kg)  ?06/15/21 (!) 302 lb 4.8 oz (137.1 kg)  ?01/24/21 296 lb 11.2 oz (134.6 kg)  ? ? ?Physical Exam  ?General:  Cooperative, in no acute distress, appropriate for stated age.  ?Neuro:  Alert and oriented,  extra-ocular muscles intact  ?HEENT:  Normocephalic, atraumatic, neck supple  ?Skin:  no gross rash, warm, pink. ?Cardiac:  RRR, S1 S2 ?Respiratory: CTA B/L  ?Vascular:  Ext warm, no cyanosis apprec.; cap RF less 2 sec. ?Psych:  No HI/SI, judgement and insight good, Euthymic mood. Full  Affect. ? ? ?Results for orders placed or performed in visit on 10/17/21  ?POCT glycosylated hemoglobin (Hb A1C)  ?Result Value Ref Range  ? Hemoglobin A1C 6.9 (A) 4.0 - 5.6 %  ? HbA1c POC (<> result, manual entry)    ? HbA1c, POC (prediabetic range)    ? HbA1c, POC (controlled diabetic range)    ? ? ?  10/17/2021  ?  8:16 AM 06/15/2021  ?  8:39 AM 01/24/2021  ? 10:12 AM 10/25/2020  ? 10:49 AM 06/14/2020  ?  3:55 PM  ?Depression screen PHQ 2/9  ?Decreased Interest 1 1 1  0 1  ?Down, Depressed, Hopeless 0 1 1 0 1  ?PHQ - 2 Score 1 2 2  0 2  ?Altered sleeping 0 1 0 0 0  ?Tired, decreased energy 1 1 1 1 1   ?Change in appetite 0 0 0 0 0  ?Feeling bad or failure about yourself  0 1 1 0 0  ?Trouble concentrating 0 1 0 0 1  ?Moving slowly or fidgety/restless 0 0 0 0 0  ?Suicidal thoughts 0 0 0 0 0  ?PHQ-9 Score 2 6 4 1 4   ?Difficult doing work/chores Somewhat difficult Somewhat difficult Somewhat difficult  Somewhat difficult  ? ? ?  10/17/2021  ?  8:16 AM 06/15/2021  ?  8:39 AM 01/24/2021  ? 10:12 AM  ?GAD 7 : Generalized Anxiety Score  ?Nervous, Anxious, on Edge 0 3 0  ?Control/stop worrying 0 3 0  ?Worry too much - different things 0 3 0  ?Trouble relaxing 1 3 0  ?Restless 0 1 0  ?Easily annoyed or irritable 0 1 0  ?Afraid - awful might happen 0 1 0  ?Total GAD 7 Score 1 15 0  ?Anxiety Difficulty Not difficult at all Somewhat difficult   ? ? ? ? Assessment & Plan  ?  ? ? ?Problem List Items Addressed This Visit   ? ?  ? Cardiovascular and Mediastinum  ? Hypertension associated with diabetes ([place])  ?  -Stable. ?-Continue current medication regimen. ?-Will collect CMP for medication monitoring. ?-Will continue to monitor. ?  ?  ? Relevant Orders  ? CBC with Differential/Platelet  ? Comprehensive metabolic panel  ?  ? Endocrine  ? Diabetes mellitus type II, controlled ([place]) - Primary  ?  -A1c mildly increased from 6.4 to 6.9, borderline at goal. Discussed with patient resuming diabetic diet. Continue ambulatory glucose monitoring.  Discussed if A1c fails to improve then recommend treatment adjustments. Pt has hx of pancreatitis with GLP-1 therapy so will consider alternatives.  ?  ?  ? Relevant Orders  ? POCT glycosylated hemoglobin (Hb A1C) (Completed)  ? CBC with Differential/Platelet  ? Hyperlipidemia associated with type 2 diabetes mellitus ([place])  ?  -Last lipid panel: HDL 31,  LDL 26 ?-Will repeat lipid panel and hepatic function today. ?-Continue current medication regimen. See med list. ?-Discussed low fat diet. ?  ?  ? Relevant Orders  ? CBC with Differential/Platelet  ? Comprehensive metabolic panel  ? Lipid panel  ?  ? Other  ? Chronic low back pain without sciatica  ?  -Will place new referral to pain management. Previous referral closed due to unable to get in contact with pt.  ?  ?  ? Relevant Orders  ? Ambulatory referral to Pain Clinic  ? Depression, major, single episode, mild ([place])  ?  -PHQ-9 score has improved from prior. Will continue sertraline 100 mg. Will continue to monitor. ?  ?  ? ? ?Return in about 4 months (around 02/16/2022) for DM, HTN, HLD .  ?   ? ? ? ?Lorrene Reid, PA-C  ?[facility] ?[phone] (phone) ?[phone] (fax) ? ?[facility] ?

## 2021-10-17 NOTE — Assessment & Plan Note (Signed)
-  Will place new referral to pain management. Previous referral closed due to unable to get in contact with pt.  ?

## 2021-10-17 NOTE — Patient Instructions (Addendum)
[id number] - Colonoscopy  ?Diabetes Mellitus and Nutrition, Adult ?When you have diabetes, or diabetes mellitus, it is very important to have healthy eating habits because your blood sugar (glucose) levels are greatly affected by what you eat and drink. Eating healthy foods in the right amounts, at about the same times every day, can help you: ?Manage your blood glucose. ?Lower your risk of heart disease. ?Improve your blood pressure. ?Reach or maintain a healthy weight. ?What can affect my meal plan? ?Every person with diabetes is different, and each person has different needs for a meal plan. Your health care provider may recommend that you work with a dietitian to make a meal plan that is best for you. Your meal plan may vary depending on factors such as: ?The calories you need. ?The medicines you take. ?Your weight. ?Your blood glucose, blood pressure, and cholesterol levels. ?Your activity level. ?Other health conditions you have, such as heart or kidney disease. ?How do carbohydrates affect me? ?Carbohydrates, also called carbs, affect your blood glucose level more than any other type of food. Eating carbs raises the amount of glucose in your blood. ?It is important to know how many carbs you can safely have in each meal. This is different for every person. Your dietitian can help you calculate how many carbs you should have at each meal and for each snack. ?How does alcohol affect me? ?Alcohol can cause a decrease in blood glucose (hypoglycemia), especially if you use insulin or take certain diabetes medicines by mouth. Hypoglycemia can be a life-threatening condition. Symptoms of hypoglycemia, such as sleepiness, dizziness, and confusion, are similar to symptoms of having too much alcohol. ?Do not drink alcohol if: ?Your health care provider tells you not to drink. ?You are pregnant, may be pregnant, or are planning to become pregnant. ?If you drink alcohol: ?Limit how much you have to: ?0-1 drink a day  for women. ?0-2 drinks a day for men. ?Know how much alcohol is in your drink. In the [place] one drink equals one 12 oz bottle of beer (355 mL), one 5 oz glass of wine (148 mL), or one 1? oz glass of hard liquor (44 mL). ?Keep yourself hydrated with water, diet soda, or unsweetened iced tea. Keep in mind that regular soda, juice, and other mixers may contain a lot of sugar and must be counted as carbs. ?What are tips for following this plan? ?Reading food labels ?Start by checking the serving size on the Nutrition Facts label of packaged foods and drinks. The number of calories and the amount of carbs, fats, and other nutrients listed on the label are based on one serving of the item. Many items contain more than one serving per package. ?Check the total grams (g) of carbs in one serving. ?Check the number of grams of saturated fats and trans fats in one serving. Choose foods that have a low amount or none of these fats. ?Check the number of milligrams (mg) of salt (sodium) in one serving. Most people should limit total sodium intake to less than 2,300 mg per day. ?Always check the nutrition information of foods labeled as "low-fat" or "nonfat." These foods may be higher in added sugar or refined carbs and should be avoided. ?Talk to your dietitian to identify your daily goals for nutrients listed on the label. ?Shopping ?Avoid buying canned, pre-made, or processed foods. These foods tend to be high in fat, sodium, and added sugar. ?Shop around the outside edge of the grocery store.  This is where you will most often find fresh fruits and vegetables, bulk grains, fresh meats, and fresh dairy products. ?Cooking ?Use low-heat cooking methods, such as baking, instead of high-heat cooking methods, such as deep frying. ?Cook using healthy oils, such as olive, canola, or sunflower oil. ?Avoid cooking with butter, cream, or high-fat meats. ?Meal planning ?Eat meals and snacks regularly, preferably at the same times every  day. Avoid going long periods of time without eating. ?Eat foods that are high in fiber, such as fresh fruits, vegetables, beans, and whole grains. ?Eat 4-6 oz (112-168 g) of lean protein each day, such as lean meat, chicken, fish, eggs, or tofu. One ounce (oz) (28 g) of lean protein is equal to: ?1 oz (28 g) of meat, chicken, or fish. ?1 egg. ?? cup (62 g) of tofu. ?Eat some foods each day that contain healthy fats, such as avocado, nuts, seeds, and fish. ?What foods should I eat? ?Fruits ?Berries. Apples. Oranges. Peaches. Apricots. Plums. Grapes. Mangoes. Papayas. Pomegranates. Kiwi. Cherries. ?Vegetables ?Leafy greens, including lettuce, spinach, kale, chard, collard greens, mustard greens, and cabbage. Beets. Cauliflower. Broccoli. Carrots. Green beans. Tomatoes. Peppers. Onions. Cucumbers. Brussels sprouts. ?Grains ?Whole grains, such as whole-wheat or whole-grain bread, crackers, tortillas, cereal, and pasta. Unsweetened oatmeal. Quinoa. Brown or wild rice. ?Meats and other proteins ?Seafood. Poultry without skin. Lean cuts of poultry and beef. Tofu. Nuts. Seeds. ?Dairy ?Low-fat or fat-free dairy products such as milk, yogurt, and cheese. ?The items listed above may not be a complete list of foods and beverages you can eat and drink. Contact a dietitian for more information. ?What foods should I avoid? ?Fruits ?Fruits canned with syrup. ?Vegetables ?Canned vegetables. Frozen vegetables with butter or cream sauce. ?Grains ?Refined white flour and flour products such as bread, pasta, snack foods, and cereals. Avoid all processed foods. ?Meats and other proteins ?Fatty cuts of meat. Poultry with skin. Breaded or fried meats. Processed meat. Avoid saturated fats. ?Dairy ?Full-fat yogurt, cheese, or milk. ?Beverages ?Sweetened drinks, such as soda or iced tea. ?The items listed above may not be a complete list of foods and beverages you should avoid. Contact a dietitian for more information. ?Questions to ask a  health care provider ?Do I need to meet with a certified diabetes care and education specialist? ?Do I need to meet with a dietitian? ?What number can I call if I have questions? ?When are the best times to check my blood glucose? ?Where to find more information: ?American Diabetes Association: diabetes.org ?Academy of Nutrition and Dietetics: eatright.org ?[employer] of Diabetes and Digestive and Kidney Diseases: AmenCredit.is ?Association of Diabetes Care & Education Specialists: diabeteseducator.org ?Summary ?It is important to have healthy eating habits because your blood sugar (glucose) levels are greatly affected by what you eat and drink. It is important to use alcohol carefully. ?A healthy meal plan will help you manage your blood glucose and lower your risk of heart disease. ?Your health care provider may recommend that you work with a dietitian to make a meal plan that is best for you. ?This information is not intended to replace advice given to you by your health care provider. Make sure you discuss any questions you have with your health care provider. ?Document Revised: 01/28/2020 Document Reviewed: 01/28/2020 ?Elsevier Patient Education ? [place]. ? ?

## 2021-10-17 NOTE — Assessment & Plan Note (Signed)
-  Stable. -Continue current medication regimen. -Will collect CMP for medication monitoring. -Will continue to monitor. 

## 2021-10-17 NOTE — Assessment & Plan Note (Signed)
-  PHQ-9 score has improved from prior. Will continue sertraline 100 mg. Will continue to monitor. ?

## 2021-10-18 ENCOUNTER — Other Ambulatory Visit: Payer: Self-pay

## 2021-10-18 DIAGNOSIS — E1169 Type 2 diabetes mellitus with other specified complication: Secondary | ICD-10-CM

## 2021-10-18 LAB — CBC WITH DIFFERENTIAL/PLATELET
Basophils Absolute: 0.1 10*3/uL (ref 0.0–0.2)
Basos: 1 %
EOS (ABSOLUTE): 0.3 10*3/uL (ref 0.0–0.4)
Eos: 4 %
Hematocrit: 47.3 % (ref 37.5–51.0)
Hemoglobin: 16.6 g/dL (ref 13.0–17.7)
Immature Grans (Abs): 0 10*3/uL (ref 0.0–0.1)
Immature Granulocytes: 0 %
Lymphocytes Absolute: 2.4 10*3/uL (ref 0.7–3.1)
Lymphs: 32 %
MCH: 29.9 pg (ref 26.6–33.0)
MCHC: 35.1 g/dL (ref 31.5–35.7)
MCV: 85 fL (ref 79–97)
Monocytes Absolute: 0.7 10*3/uL (ref 0.1–0.9)
Monocytes: 9 %
Neutrophils Absolute: 4.1 10*3/uL (ref 1.4–7.0)
Neutrophils: 54 %
Platelets: 279 10*3/uL (ref 150–450)
RBC: 5.56 x10E6/uL (ref 4.14–5.80)
RDW: 13.1 % (ref 11.6–15.4)
WBC: 7.6 10*3/uL (ref 3.4–10.8)

## 2021-10-18 LAB — COMPREHENSIVE METABOLIC PANEL
ALT: 40 IU/L (ref 0–44)
AST: 24 IU/L (ref 0–40)
Albumin/Globulin Ratio: 2.2 (ref 1.2–2.2)
Albumin: 4.6 g/dL (ref 4.0–5.0)
Alkaline Phosphatase: 79 IU/L (ref 44–121)
BUN/Creatinine Ratio: 12 (ref 9–20)
BUN: 13 mg/dL (ref 6–24)
Bilirubin Total: 0.5 mg/dL (ref 0.0–1.2)
CO2: 18 mmol/L — ABNORMAL LOW (ref 20–29)
Calcium: 9.4 mg/dL (ref 8.7–10.2)
Chloride: 105 mmol/L (ref 96–106)
Creatinine, Ser: 1.07 mg/dL (ref 0.76–1.27)
Globulin, Total: 2.1 g/dL (ref 1.5–4.5)
Glucose: 136 mg/dL — ABNORMAL HIGH (ref 70–99)
Potassium: 4.9 mmol/L (ref 3.5–5.2)
Sodium: 141 mmol/L (ref 134–144)
Total Protein: 6.7 g/dL (ref 6.0–8.5)
eGFR: 85 mL/min/{1.73_m2} (ref >59)

## 2021-10-18 LAB — LIPID PANEL
Chol/HDL Ratio: 1.9 ratio (ref 0.0–5.0)
Cholesterol, Total: 65 mg/dL — ABNORMAL LOW (ref 100–199)
HDL: 35 mg/dL — ABNORMAL LOW (ref >39)
LDL Chol Calc (NIH): 5 mg/dL (ref 0–99)
Triglycerides: 150 mg/dL — ABNORMAL HIGH (ref 0–149)
VLDL Cholesterol Cal: 25 mg/dL (ref 5–40)

## 2021-11-10 ENCOUNTER — Ambulatory Visit (AMBULATORY_SURGERY_CENTER): Payer: BC Managed Care – PPO

## 2021-11-10 VITALS — Ht 73.0 in | Wt 300.0 lb

## 2021-11-10 DIAGNOSIS — Z1211 Encounter for screening for malignant neoplasm of colon: Secondary | ICD-10-CM

## 2021-11-10 MED ORDER — NA SULFATE-K SULFATE-MG SULF 17.5-3.13-1.6 GM/177ML PO SOLN: 1.0000 | Freq: Once | ORAL | 0 refills | Status: AC

## 2021-11-10 NOTE — Progress Notes (Signed)
No egg or soy allergy known to patient  ?No issues known to pt with past sedation with any surgeries or procedures ?Patient denies ever being told they had issues or difficulty with intubation  ?No FH of Malignant Hyperthermia ?Pt is not on diet pills ?Pt is not on home 02  ?Pt is not on blood thinners  ?Pt denies issues with constipation  ?No A fib or A flutter ?NO PA's for preps discussed with pt in PV today  ?Discussed with pt there will be an out-of-pocket cost for prep and that varies from $0 to 70 + dollars - pt verbalized understanding  ?Pt instructed to use Singlecare.com or GoodRx for a price reduction on prep  ?PV completed over the phone. Pt verified name, DOB, address and insurance during PV today.  ?Pt mailed instruction packet with copy of consent form to read and not return, and instructions.  ?Pt encouraged to call with questions or issues.  ? ?Insurance confirmed with pt at [facility] today  ? ?

## 2021-12-08 ENCOUNTER — Encounter: Payer: Self-pay | Admitting: Gastroenterology

## 2021-12-08 ENCOUNTER — Other Ambulatory Visit: Payer: Self-pay | Admitting: Physician Assistant

## 2021-12-10 ENCOUNTER — Other Ambulatory Visit: Payer: Self-pay | Admitting: Physician Assistant

## 2021-12-10 DIAGNOSIS — E1159 Type 2 diabetes mellitus with other circulatory complications: Secondary | ICD-10-CM

## 2021-12-12 ENCOUNTER — Encounter: Payer: Self-pay | Admitting: Gastroenterology

## 2021-12-12 ENCOUNTER — Ambulatory Visit (AMBULATORY_SURGERY_CENTER): Payer: BC Managed Care – PPO | Admitting: Gastroenterology

## 2021-12-12 VITALS — BP 111/70 | HR 82 | Temp 97.8°F | Resp 16 | Ht 73.0 in | Wt 300.0 lb

## 2021-12-12 DIAGNOSIS — Z1211 Encounter for screening for malignant neoplasm of colon: Secondary | ICD-10-CM

## 2021-12-12 DIAGNOSIS — D125 Benign neoplasm of sigmoid colon: Secondary | ICD-10-CM

## 2021-12-12 DIAGNOSIS — D124 Benign neoplasm of descending colon: Secondary | ICD-10-CM | POA: Diagnosis not present

## 2021-12-12 DIAGNOSIS — K640 First degree hemorrhoids: Secondary | ICD-10-CM

## 2021-12-12 MED ORDER — SODIUM CHLORIDE 0.9 % IV SOLN: 500.0000 mL | Freq: Once | INTRAVENOUS | Status: DC

## 2021-12-12 NOTE — Progress Notes (Unsigned)
PT taken to PACU. Monitors in place. VSS. Report given to RN. 

## 2021-12-12 NOTE — Progress Notes (Signed)
Called to room to assist during endoscopic procedure.  Patient ID and intended procedure confirmed with present staff. Received instructions for my participation in the procedure from the performing physician.  

## 2021-12-12 NOTE — Progress Notes (Signed)
Pt's states no medical or surgical changes since previsit or office visit. 

## 2021-12-12 NOTE — Progress Notes (Unsigned)
GASTROENTEROLOGY PROCEDURE H&P NOTE   Primary Care Physician: Lorrene Reid, PA-C    Reason for Procedure:  Colon Cancer screening  Plan:    Colonoscopy  Patient is appropriate for endoscopic procedure(s) in the ambulatory ([place]) setting.  The nature of the procedure, as well as the risks, benefits, and alternatives were carefully and thoroughly reviewed with the patient. Ample time for discussion and questions allowed. The patient understood, was satisfied, and agreed to proceed.     HPI: Eddie Hancock is a 51 y.o. male who presents for colonoscopy for routine Colon Cancer screening.  No active GI symptoms.  No known family history of colon cancer or related malignancy.  Patient is otherwise without complaints or active issues today.  Past Medical History:  Diagnosis Date   Anxiety    situational   Arthritis    neck/spine   Diabetes mellitus without complication ([place])    on meds   GERD (gastroesophageal reflux disease)    on meds   Hyperlipidemia    not on meds at this time (11/10/2021)   Hypertension    on meds   Obesity    Seasonal allergies     Past Surgical History:  Procedure Laterality Date   APPENDECTOMY      Prior to Admission medications   Medication Sig Start Date End Date Taking? Authorizing Provider  allopurinol (ZYLOPRIM) 300 MG tablet TAKE 1 TABLET(300 MG) BY MOUTH DAILY 12/08/21   Abonza, Maritza, PA-C  amLODipine (NORVASC) 5 MG tablet TAKE 1 TABLET(5 MG) BY MOUTH DAILY 08/19/21   Abonza, Maritza, PA-C  Blood Glucose Monitoring Suppl (CONTOUR NEXT ONE) KIT 1 each by Does not apply route 2 (two) times daily. Check fasting blood sugar in morning and then recheck blood sugar after largest meal. 10/02/19   Opalski, Neoma Laming, DO  cyclobenzaprine (FLEXERIL) 10 MG tablet TAKE 1 TABLET(10 MG) BY MOUTH AT BEDTIME AS NEEDED FOR MUSCLE SPASMS 10/17/21   Abonza, Maritza, PA-C  empagliflozin (JARDIANCE) 25 MG TABS tablet TAKE 1 TABLET(25 MG) BY MOUTH DAILY BEFORE  BREAKFAST 12/08/21   Abonza, Maritza, PA-C  ezetimibe (ZETIA) 10 MG tablet TAKE 1 TABLET BY MOUTH DAILY 08/19/21   Abonza, Maritza, PA-C  Ginseng 100 MG CAPS Take by mouth.    [provider]  glucose blood test strip Use to check blood sugars three times daily 01/17/21   Abonza, Maritza, PA-C  ibuprofen (ADVIL) 800 MG tablet TAKE 1 TABLET(800 MG) BY MOUTH TWICE DAILY AS NEEDED FOR MODERATE PAIN 08/30/21   Abonza, Maritza, PA-C  Insulin Pen Needle (PEN NEEDLES 31GX5/16") 31G X 8 MM MISC 1 each by Does not apply route daily. Use one needle daily with Victoza injection 06/10/19   Danford, Valetta Fuller D, NP  lisinopril (ZESTRIL) 20 MG tablet Take 1 tablet (20 mg total) by mouth daily. 01/29/21   Lorrene Reid, PA-C  metFORMIN (GLUCOPHAGE) 1000 MG tablet TAKE 1 TABLET(1000 MG) BY MOUTH TWICE DAILY WITH A MEAL 12/08/21   Abonza, Maritza, PA-C  methocarbamol (ROBAXIN) 500 MG tablet Take 500 mg by mouth every 6 (six) hours as needed for muscle spasms.    [provider]  Multiple Vitamin (MULTIVITAMIN) tablet Take 1 tablet by mouth daily.    [provider]  Probiotic Product (PROBIOTIC DAILY PO) Take 1 tablet by mouth daily.    [provider]  sertraline (ZOLOFT) 100 MG tablet TAKE 1 TABLET(50 MG) BY MOUTH DAILY 10/04/21   Lorrene Reid, PA-C  Turmeric (QC TUMERIC COMPLEX PO)  Take by mouth.    [provider]  liraglutide (VICTOZA) 18 MG/3ML SOPN Inject 0.1 mLs (0.6 mg total) into the skin daily for 7 days, THEN 0.2 mLs (1.2 mg total) daily for 7 days, THEN 0.2 mLs (1.2 mg total) daily for 7 days. 04/29/19 07/18/19  Mina Marble D, NP    Current Outpatient Medications  Medication Sig Dispense Refill   allopurinol (ZYLOPRIM) 300 MG tablet TAKE 1 TABLET(300 MG) BY MOUTH DAILY 90 tablet 0   amLODipine (NORVASC) 5 MG tablet TAKE 1 TABLET(5 MG) BY MOUTH DAILY 90 tablet 1   Blood Glucose Monitoring Suppl (CONTOUR NEXT ONE) KIT 1 each by Does not apply route 2 (two) times  daily. Check fasting blood sugar in morning and then recheck blood sugar after largest meal. 1 kit 0   cyclobenzaprine (FLEXERIL) 10 MG tablet TAKE 1 TABLET(10 MG) BY MOUTH AT BEDTIME AS NEEDED FOR MUSCLE SPASMS 60 tablet 1   empagliflozin (JARDIANCE) 25 MG TABS tablet TAKE 1 TABLET(25 MG) BY MOUTH DAILY BEFORE BREAKFAST 90 tablet 0   ezetimibe (ZETIA) 10 MG tablet TAKE 1 TABLET BY MOUTH DAILY 90 tablet 1   Ginseng 100 MG CAPS Take by mouth.     glucose blood test strip Use to check blood sugars three times daily 300 each 4   ibuprofen (ADVIL) 800 MG tablet TAKE 1 TABLET(800 MG) BY MOUTH TWICE DAILY AS NEEDED FOR MODERATE PAIN 30 tablet 1   Insulin Pen Needle (PEN NEEDLES 31GX5/16") 31G X 8 MM MISC 1 each by Does not apply route daily. Use one needle daily with Victoza injection 100 each 0   lisinopril (ZESTRIL) 20 MG tablet Take 1 tablet (20 mg total) by mouth daily. 90 tablet 0   metFORMIN (GLUCOPHAGE) 1000 MG tablet TAKE 1 TABLET(1000 MG) BY MOUTH TWICE DAILY WITH A MEAL 180 tablet 0   methocarbamol (ROBAXIN) 500 MG tablet Take 500 mg by mouth every 6 (six) hours as needed for muscle spasms.     Multiple Vitamin (MULTIVITAMIN) tablet Take 1 tablet by mouth daily.     Probiotic Product (PROBIOTIC DAILY PO) Take 1 tablet by mouth daily.     sertraline (ZOLOFT) 100 MG tablet TAKE 1 TABLET(50 MG) BY MOUTH DAILY 90 tablet 0   Turmeric (QC TUMERIC COMPLEX PO) Take by mouth.     Current Facility-Administered Medications  Medication Dose Route Frequency Provider Last Rate Last Admin   0.9 %  sodium chloride infusion  500 mL Intravenous Once Vega Withrow V, DO        Allergies as of 12/12/2021 - Review Complete 12/12/2021  Allergen Reaction Noted   Cetirizine Shortness Of Breath 09/09/2015   Liraglutide Other (See Comments) 09/11/2019   Onglyza [saxagliptin] Other (See Comments) 09/11/2019    Family History  Problem Relation Age of Onset   Hypertension Mother    Learning disabilities  Maternal Grandmother    Learning disabilities Maternal Grandfather    Diabetes Daughter    Colon polyps Neg Hx    Colon cancer Neg Hx    Esophageal cancer Neg Hx    Stomach cancer Neg Hx    Rectal cancer Neg Hx     Social History   Socioeconomic History   Marital status: Married    Spouse name: Not on file   Number of children: Not on file   Years of education: Not on file   Highest education level: Not on file  Occupational History   Not on file  Tobacco Use   Smoking status: Former    Types: Cigarettes    Quit date: 11/07/2016    Years since quitting: 5.0   Smokeless tobacco: Never  Vaping Use   Vaping Use: Never used  Substance and Sexual Activity   Alcohol use: Yes    Comment: rare   Drug use: No   Sexual activity: Not on file  Other Topics Concern   Not on file  Social History Narrative   Not on file   Social Determinants of Health   Financial Resource Strain: Not on file  Food Insecurity: Not on file  Transportation Needs: Not on file  Physical Activity: Not on file  Stress: Not on file  Social Connections: Not on file  Intimate Partner Violence: Not on file    Physical Exam: Vital signs in last 24 hours: @There  were no vitals taken for this visit. GEN: NAD EYE: Sclerae anicteric ENT: MMM CV: Non-tachycardic Pulm: CTA b/l GI: Soft, NT/ND NEURO:  Alert & Oriented x 3   Gerrit Heck, DO [facility]   12/12/2021 8:32 AM

## 2021-12-12 NOTE — Op Note (Signed)
[facility] Patient Name: Eddie Hancock Procedure Date: 12/12/2021 8:32 AM MRN: [id number] Endoscopist: Gerrit Heck , MD Age: 51 Referring MD:  Date of Birth: 1971-01-18 Gender: Male Account #: [id number] Procedure:                Colonoscopy Indications:              Screening for colorectal malignant neoplasm, This                            is the patient's first colonoscopy Medicines:                Monitored Anesthesia Care Procedure:                Pre-Anesthesia Assessment:                           - Prior to the procedure, a History and Physical                            was performed, and patient medications and                            allergies were reviewed. The patient's tolerance of                            previous anesthesia was also reviewed. The risks                            and benefits of the procedure and the sedation                            options and risks were discussed with the patient.                            All questions were answered, and informed consent                            was obtained. Prior Anticoagulants: The patient has                            taken no previous anticoagulant or antiplatelet                            agents. ASA Grade Assessment: III - A patient with                            severe systemic disease. After reviewing the risks                            and benefits, the patient was deemed in                            satisfactory condition to undergo the procedure.  After obtaining informed consent, the colonoscope                            was passed under direct vision. Throughout the                            procedure, the patient's blood pressure, pulse, and                            oxygen saturations were monitored continuously. The                            CF [id number] #[id number] was introduced through the anus                            and advanced to the the  cecum, identified by the                            appendiceal orifice. The colonoscopy was performed                            without difficulty. The patient tolerated the                            procedure well. The quality of the bowel                            preparation was good. The ileocecal valve,                            appendiceal orifice, and rectum were photographed. Scope In: 8:49:57 AM Scope Out: 9:14:39 AM Scope Withdrawal Time: 0 hours 18 minutes 40 seconds  Total Procedure Duration: 0 hours 24 minutes 42 seconds  Findings:                 The perianal and digital rectal examinations were                            normal.                           Two sessile polyps were found in the sigmoid colon                            and descending colon. The polyps were 3 to 5 mm in                            size. These polyps were removed with a cold snare.                            Resection and retrieval were complete. Estimated                            blood loss was minimal.  Non-bleeding internal hemorrhoids were found during                            retroflexion. The hemorrhoids were small. Complications:            No immediate complications. Estimated Blood Loss:     Estimated blood loss was minimal. Impression:               - Two 3 to 5 mm polyps in the sigmoid colon and in                            the descending colon, removed with a cold snare.                            Resected and retrieved.                           - Non-bleeding internal hemorrhoids.                           - The remainder of the colon was normal appearing. Recommendation:           - Patient has a contact number available for                            emergencies. The signs and symptoms of potential                            delayed complications were discussed with the                            patient. Return to normal activities tomorrow.                             Written discharge instructions were provided to the                            patient.                           - Resume previous diet.                           - Continue present medications.                           - Await pathology results.                           - Repeat colonoscopy for surveillance based on                            pathology results.                           - Return to GI clinic PRN. Gerrit Heck, MD 12/12/2021 9:18:58 AM

## 2021-12-12 NOTE — Patient Instructions (Signed)
Hand out on polyps and diverticulosis given.   YOU HAD AN ENDOSCOPIC PROCEDURE TODAY AT [facility]:   Refer to the procedure report that was given to you for any specific questions about what was found during the examination.  If the procedure report does not answer your questions, please call your gastroenterologist to clarify.  If you requested that your care partner not be given the details of your procedure findings, then the procedure report has been included in a sealed envelope for you to review at your convenience later.  YOU SHOULD EXPECT: Some feelings of bloating in the abdomen. Passage of more gas than usual.  Walking can help get rid of the air that was put into your GI tract during the procedure and reduce the bloating. If you had a lower endoscopy (such as a colonoscopy or flexible sigmoidoscopy) you may notice spotting of blood in your stool or on the toilet paper. If you underwent a bowel prep for your procedure, you may not have a normal bowel movement for a few days.  Please Note:  You might notice some irritation and congestion in your nose or some drainage.  This is from the oxygen used during your procedure.  There is no need for concern and it should clear up in a day or so.  SYMPTOMS TO REPORT IMMEDIATELY:  Following lower endoscopy (colonoscopy or flexible sigmoidoscopy):  Excessive amounts of blood in the stool  Significant tenderness or worsening of abdominal pains  Swelling of the abdomen that is new, acute  Fever of 100F or higher   For urgent or emergent issues, a gastroenterologist can be reached at any hour by calling [phone]. Do not use MyChart messaging for urgent concerns.    DIET:  We do recommend a small meal at first, but then you may proceed to your regular diet.  Drink plenty of fluids but you should avoid alcoholic beverages for 24 hours.  ACTIVITY:  You should plan to take it easy for the rest of today and you should NOT  DRIVE or use heavy machinery until tomorrow (because of the sedation medicines used during the test).    FOLLOW UP: Our staff will call the number listed on your records 24-72 hours following your procedure to check on you and address any questions or concerns that you may have regarding the information given to you following your procedure. If we do not reach you, we will leave a message.  We will attempt to reach you two times.  During this call, we will ask if you have developed any symptoms of COVID 19. If you develop any symptoms (ie: fever, flu-like symptoms, shortness of breath, cough etc.) before then, please call [phone].  If you test positive for Covid 19 in the 2 weeks post procedure, please call and report this information to [place].    If any biopsies were taken you will be contacted by phone or by letter within the next 1-3 weeks.  Please call us at [phone] if you have not heard about the biopsies in 3 weeks.    SIGNATURES/CONFIDENTIALITY: You and/or your care partner have signed paperwork which will be entered into your electronic medical record.  These signatures attest to the fact that that the information above on your After Visit Summary has been reviewed and is understood.  Full responsibility of the confidentiality of this discharge information lies with you and/or your care-partner.

## 2021-12-13 ENCOUNTER — Telehealth: Payer: Self-pay | Admitting: *Deleted

## 2021-12-13 NOTE — Telephone Encounter (Signed)
First attempt, left VM.  

## 2021-12-13 NOTE — Telephone Encounter (Signed)
  Follow up Call-     12/12/2021    8:35 AM  Call back number  Post procedure Call Back phone  # [phone]  Permission to leave phone message Yes     Patient questions:  Do you have a fever, pain , or abdominal swelling? No. Pain Score  0 *  Have you tolerated food without any problems? Yes.    Have you been able to return to your normal activities? Yes.    Do you have any questions about your discharge instructions: Diet   No. Medications  No. Follow up visit  No.  Do you have questions or concerns about your Care? No.  Actions: * If pain score is 4 or above: No action needed, pain <4.

## 2021-12-20 ENCOUNTER — Encounter: Payer: Self-pay | Admitting: Gastroenterology

## 2021-12-30 ENCOUNTER — Other Ambulatory Visit: Payer: Self-pay | Admitting: Physician Assistant

## 2021-12-30 DIAGNOSIS — F320 Major depressive disorder, single episode, mild: Secondary | ICD-10-CM

## 2022-01-09 ENCOUNTER — Other Ambulatory Visit: Payer: Self-pay | Admitting: Orthopaedic Surgery

## 2022-01-22 ENCOUNTER — Other Ambulatory Visit: Payer: Self-pay | Admitting: Physician Assistant

## 2022-01-22 DIAGNOSIS — E1169 Type 2 diabetes mellitus with other specified complication: Secondary | ICD-10-CM

## 2022-02-09 ENCOUNTER — Other Ambulatory Visit: Payer: Self-pay | Admitting: Physician Assistant

## 2022-02-09 DIAGNOSIS — M545 Low back pain, unspecified: Secondary | ICD-10-CM

## 2022-02-18 ENCOUNTER — Other Ambulatory Visit: Payer: Self-pay | Admitting: Physician Assistant

## 2022-02-18 DIAGNOSIS — E1169 Type 2 diabetes mellitus with other specified complication: Secondary | ICD-10-CM

## 2022-02-20 ENCOUNTER — Ambulatory Visit (INDEPENDENT_AMBULATORY_CARE_PROVIDER_SITE_OTHER): Payer: BC Managed Care – PPO | Admitting: Physician Assistant

## 2022-02-20 ENCOUNTER — Encounter: Payer: Self-pay | Admitting: Physician Assistant

## 2022-02-20 VITALS — BP 135/87 | HR 100 | Temp 97.7°F | Ht 73.0 in | Wt 300.0 lb

## 2022-02-20 DIAGNOSIS — I152 Hypertension secondary to endocrine disorders: Secondary | ICD-10-CM | POA: Diagnosis not present

## 2022-02-20 DIAGNOSIS — F320 Major depressive disorder, single episode, mild: Secondary | ICD-10-CM | POA: Diagnosis not present

## 2022-02-20 DIAGNOSIS — E1159 Type 2 diabetes mellitus with other circulatory complications: Secondary | ICD-10-CM

## 2022-02-20 DIAGNOSIS — E1169 Type 2 diabetes mellitus with other specified complication: Secondary | ICD-10-CM

## 2022-02-20 DIAGNOSIS — E782 Mixed hyperlipidemia: Secondary | ICD-10-CM

## 2022-02-20 LAB — POCT GLYCOSYLATED HEMOGLOBIN (HGB A1C): Hemoglobin A1C: 6.2 % — AB (ref 4.0–5.6)

## 2022-02-20 NOTE — Patient Instructions (Signed)
Diabetes Mellitus and Foot Care Foot care is an important part of your health, especially when you have diabetes. Diabetes may cause you to have problems because of poor blood flow (circulation) to your feet and legs, which can cause your skin to: Become thinner and drier. Break more easily. Heal more slowly. Peel and crack. You may also have nerve damage (neuropathy) in your legs and feet, causing decreased feeling in them. This means that you may not notice minor injuries to your feet that could lead to more serious problems. Noticing and addressing any potential problems early is the best way to prevent future foot problems. How to care for your feet Foot hygiene  Wash your feet daily with warm water and mild soap. Do not use hot water. Then, pat your feet and the areas between your toes until they are completely dry. Do not soak your feet as this can dry your skin. Trim your toenails straight across. Do not dig under them or around the cuticle. File the edges of your nails with an emery board or nail file. Apply a moisturizing lotion or petroleum jelly to the skin on your feet and to dry, brittle toenails. Use lotion that does not contain alcohol and is unscented. Do not apply lotion between your toes. Shoes and socks Wear clean socks or stockings every day. Make sure they are not too tight. Do not wear knee-high stockings since they may decrease blood flow to your legs. Wear shoes that fit properly and have enough cushioning. Always look in your shoes before you put them on to be sure there are no objects inside. To break in new shoes, wear them for just a few hours a day. This prevents injuries on your feet. Wounds, scrapes, corns, and calluses  Check your feet daily for blisters, cuts, bruises, sores, and redness. If you cannot see the bottom of your feet, use a mirror or ask someone for help. Do not cut corns or calluses or try to remove them with medicine. If you find a minor scrape,  cut, or break in the skin on your feet, keep it and the skin around it clean and dry. You may clean these areas with mild soap and water. Do not clean the area with peroxide, alcohol, or iodine. If you have a wound, scrape, corn, or callus on your foot, look at it several times a day to make sure it is healing and not infected. Check for: Redness, swelling, or pain. Fluid or blood. Warmth. Pus or a bad smell. General tips Do not cross your legs. This may decrease blood flow to your feet. Do not use heating pads or hot water bottles on your feet. They may burn your skin. If you have lost feeling in your feet or legs, you may not know this is happening until it is too late. Protect your feet from hot and cold by wearing shoes, such as at the beach or on hot pavement. Schedule a complete foot exam at least once a year (annually) or more often if you have foot problems. Report any cuts, sores, or bruises to your health care provider immediately. Where to find more information American Diabetes Association: www.diabetes.org Association of Diabetes Care & Education Specialists: www.diabeteseducator.org Contact a health care provider if: You have a medical condition that increases your risk of infection and you have any cuts, sores, or bruises on your feet. You have an injury that is not healing. You have redness on your legs or feet. You   feel burning or tingling in your legs or feet. You have pain or cramps in your legs and feet. Your legs or feet are numb. Your feet always feel cold. You have pain around any toenails. Get help right away if: You have a wound, scrape, corn, or callus on your foot and: You have pain, swelling, or redness that gets worse. You have fluid or blood coming from the wound, scrape, corn, or callus. Your wound, scrape, corn, or callus feels warm to the touch. You have pus or a bad smell coming from the wound, scrape, corn, or callus. You have a fever. You have a red  line going up your leg. Summary Check your feet every day for blisters, cuts, bruises, sores, and redness. Apply a moisturizing lotion or petroleum jelly to the skin on your feet and to dry, brittle toenails. Wear shoes that fit properly and have enough cushioning. If you have foot problems, report any cuts, sores, or bruises to your health care provider immediately. Schedule a complete foot exam at least once a year (annually) or more often if you have foot problems. This information is not intended to replace advice given to you by your health care provider. Make sure you discuss any questions you have with your health care provider. Document Revised: 01/15/2020 Document Reviewed: 01/15/2020 Elsevier Patient Education  2023 Elsevier Inc.  

## 2022-02-20 NOTE — Assessment & Plan Note (Signed)
-  PHQ-9 score of 4. Stable. Continue current medication regimen. Recommend stress reduction techniques. Will continue to monitor.

## 2022-02-20 NOTE — Assessment & Plan Note (Signed)
-  A1c has improved from 6.9 to 6.2, will continue current medication regimen. Recommend to continue ambulatory glucose monitoring. Recommend annual diabetic eye exam. Will continue to monitor.
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
## 2022-02-20 NOTE — Assessment & Plan Note (Signed)
-  Last lipid panel: HDL 35, LDL 5 -Patient discontinued statin therapy to evaluate for side effects contributing to polyarthralgia which did not change so collecting lipid panel/hepatic function today and pending lab results recommend restarting rosuvastatin 5 mg daily. Continue Zetia 10 mg.

## 2022-02-20 NOTE — Assessment & Plan Note (Signed)
-  BP suboptimal today. Ambulatory BP readings stable. Will continue current medication regimen and will continue to monitor. Will collect CMP to monitor renal function and electrolytes.

## 2022-02-21 LAB — COMPREHENSIVE METABOLIC PANEL
ALT: 47 IU/L — ABNORMAL HIGH (ref 0–44)
AST: 22 IU/L (ref 0–40)
Albumin/Globulin Ratio: 2 (ref 1.2–2.2)
Albumin: 4.9 g/dL (ref 4.1–5.1)
Alkaline Phosphatase: 87 IU/L (ref 44–121)
BUN/Creatinine Ratio: 18 (ref 9–20)
BUN: 18 mg/dL (ref 6–24)
Bilirubin Total: 1 mg/dL (ref 0.0–1.2)
CO2: 15 mmol/L — ABNORMAL LOW (ref 20–29)
Calcium: 9.8 mg/dL (ref 8.7–10.2)
Chloride: 99 mmol/L (ref 96–106)
Creatinine, Ser: 1.02 mg/dL (ref 0.76–1.27)
Globulin, Total: 2.5 g/dL (ref 1.5–4.5)
Glucose: 153 mg/dL — ABNORMAL HIGH (ref 70–99)
Potassium: 4.7 mmol/L (ref 3.5–5.2)
Sodium: 134 mmol/L (ref 134–144)
Total Protein: 7.4 g/dL (ref 6.0–8.5)
eGFR: 90 mL/min/{1.73_m2} (ref >59)

## 2022-02-21 LAB — LIPID PANEL
Chol/HDL Ratio: 7.1 ratio — ABNORMAL HIGH (ref 0.0–5.0)
Cholesterol, Total: 213 mg/dL — ABNORMAL HIGH (ref 100–199)
HDL: 30 mg/dL — ABNORMAL LOW (ref >39)
LDL Chol Calc (NIH): 76 mg/dL (ref 0–99)
Triglycerides: 675 mg/dL (ref 0–149)
VLDL Cholesterol Cal: 107 mg/dL — ABNORMAL HIGH (ref 5–40)

## 2022-03-08 ENCOUNTER — Other Ambulatory Visit: Payer: Self-pay | Admitting: Physician Assistant

## 2022-03-11 ENCOUNTER — Other Ambulatory Visit: Payer: Self-pay | Admitting: Physician Assistant

## 2022-03-11 DIAGNOSIS — E1159 Type 2 diabetes mellitus with other circulatory complications: Secondary | ICD-10-CM

## 2022-03-30 ENCOUNTER — Other Ambulatory Visit: Payer: Self-pay | Admitting: Physician Assistant

## 2022-03-30 DIAGNOSIS — F320 Major depressive disorder, single episode, mild: Secondary | ICD-10-CM

## 2022-04-18 ENCOUNTER — Other Ambulatory Visit: Payer: Self-pay | Admitting: Physician Assistant

## 2022-04-18 DIAGNOSIS — E1169 Type 2 diabetes mellitus with other specified complication: Secondary | ICD-10-CM

## 2022-04-18 DIAGNOSIS — E1159 Type 2 diabetes mellitus with other circulatory complications: Secondary | ICD-10-CM

## 2022-06-13 ENCOUNTER — Other Ambulatory Visit: Payer: Self-pay

## 2022-06-13 DIAGNOSIS — I152 Hypertension secondary to endocrine disorders: Secondary | ICD-10-CM

## 2022-06-13 MED ORDER — LISINOPRIL 20 MG PO TABS: ORAL_TABLET | ORAL | 0 refills | Status: DC

## 2022-06-21 ENCOUNTER — Other Ambulatory Visit: Payer: Self-pay | Admitting: Nurse Practitioner

## 2022-06-21 ENCOUNTER — Other Ambulatory Visit: Payer: Self-pay

## 2022-06-21 DIAGNOSIS — M1A9XX0 Chronic gout, unspecified, without tophus (tophi): Secondary | ICD-10-CM

## 2022-06-21 DIAGNOSIS — M545 Low back pain, unspecified: Secondary | ICD-10-CM

## 2022-06-21 MED ORDER — CYCLOBENZAPRINE HCL 10 MG PO TABS: ORAL_TABLET | ORAL | 2 refills | Status: DC

## 2022-06-21 MED ORDER — ALLOPURINOL 300 MG PO TABS: ORAL_TABLET | ORAL | 1 refills | Status: DC

## 2022-06-26 ENCOUNTER — Ambulatory Visit: Payer: BC Managed Care – PPO | Admitting: Physician Assistant

## 2022-06-30 ENCOUNTER — Other Ambulatory Visit: Payer: Self-pay

## 2022-06-30 DIAGNOSIS — F320 Major depressive disorder, single episode, mild: Secondary | ICD-10-CM

## 2022-06-30 MED ORDER — SERTRALINE HCL 100 MG PO TABS: 100.0000 mg | ORAL_TABLET | Freq: Every day | ORAL | 0 refills | Status: DC

## 2022-06-30 MED ORDER — EMPAGLIFLOZIN 25 MG PO TABS: ORAL_TABLET | ORAL | 0 refills | Status: DC

## 2022-07-11 ENCOUNTER — Other Ambulatory Visit: Payer: Self-pay

## 2022-07-11 DIAGNOSIS — E1169 Type 2 diabetes mellitus with other specified complication: Secondary | ICD-10-CM

## 2022-07-11 MED ORDER — METFORMIN HCL 1000 MG PO TABS: ORAL_TABLET | ORAL | 0 refills | Status: DC

## 2022-07-25 ENCOUNTER — Ambulatory Visit: Payer: BC Managed Care – PPO | Admitting: Nurse Practitioner

## 2022-08-07 ENCOUNTER — Ambulatory Visit: Payer: BC Managed Care – PPO | Admitting: Nurse Practitioner

## 2022-08-11 ENCOUNTER — Other Ambulatory Visit: Payer: Self-pay | Admitting: Nurse Practitioner

## 2022-08-11 DIAGNOSIS — E1169 Type 2 diabetes mellitus with other specified complication: Secondary | ICD-10-CM

## 2022-08-11 NOTE — Telephone Encounter (Signed)
L.O.V: 02/20/22  N.O.V: 08/14/22  L.R.F: 07/11/22 Metformin 60 tab 0   Refills will be addressed at next OV

## 2022-08-14 ENCOUNTER — Other Ambulatory Visit: Payer: Self-pay

## 2022-08-14 ENCOUNTER — Encounter: Payer: Self-pay | Admitting: Nurse Practitioner

## 2022-08-14 ENCOUNTER — Ambulatory Visit (INDEPENDENT_AMBULATORY_CARE_PROVIDER_SITE_OTHER): Payer: BC Managed Care – PPO | Admitting: Nurse Practitioner

## 2022-08-14 VITALS — BP 131/87 | HR 106 | Resp 18 | Ht 73.0 in | Wt 293.0 lb

## 2022-08-14 DIAGNOSIS — E782 Mixed hyperlipidemia: Secondary | ICD-10-CM

## 2022-08-14 DIAGNOSIS — E1169 Type 2 diabetes mellitus with other specified complication: Secondary | ICD-10-CM | POA: Diagnosis not present

## 2022-08-14 DIAGNOSIS — M545 Low back pain, unspecified: Secondary | ICD-10-CM | POA: Diagnosis not present

## 2022-08-14 DIAGNOSIS — L409 Psoriasis, unspecified: Secondary | ICD-10-CM

## 2022-08-14 DIAGNOSIS — I152 Hypertension secondary to endocrine disorders: Secondary | ICD-10-CM

## 2022-08-14 DIAGNOSIS — E1159 Type 2 diabetes mellitus with other circulatory complications: Secondary | ICD-10-CM | POA: Diagnosis not present

## 2022-08-14 DIAGNOSIS — L739 Follicular disorder, unspecified: Secondary | ICD-10-CM

## 2022-08-14 DIAGNOSIS — G8929 Other chronic pain: Secondary | ICD-10-CM

## 2022-08-14 DIAGNOSIS — F320 Major depressive disorder, single episode, mild: Secondary | ICD-10-CM

## 2022-08-14 LAB — POCT GLYCOSYLATED HEMOGLOBIN (HGB A1C): HbA1c POC (<> result, manual entry): 7.2 % (ref 4.0–5.6)

## 2022-08-14 MED ORDER — DOXYCYCLINE HYCLATE 100 MG PO TABS: 100.0000 mg | ORAL_TABLET | Freq: Two times a day (BID) | ORAL | 0 refills | Status: DC

## 2022-08-14 MED ORDER — CYCLOBENZAPRINE HCL 10 MG PO TABS: ORAL_TABLET | ORAL | 2 refills | Status: DC

## 2022-08-14 MED ORDER — CLOBETASOL PROPIONATE 0.05 % EX CREA: 1.0000 | TOPICAL_CREAM | Freq: Two times a day (BID) | CUTANEOUS | 0 refills | Status: DC

## 2022-08-14 MED ORDER — ROSUVASTATIN CALCIUM 5 MG PO TABS: 5.0000 mg | ORAL_TABLET | Freq: Every day | ORAL | 0 refills | Status: DC

## 2022-08-14 MED ORDER — METFORMIN HCL 1000 MG PO TABS: ORAL_TABLET | ORAL | 2 refills | Status: DC

## 2022-08-14 NOTE — Progress Notes (Signed)
Established patient visit   Patient: Eddie Hancock   DOB: November 01, 1970   52 y.o. Male  MRN: [id number] Visit Date: 08/14/2022   Chief Complaint  Patient presents with   Follow-up   Diabetes   Hypertension   Hyperlipidemia   Subjective    HPI  Follow up  -hypertension  --still doing well without amlodipine for past 3 months  --heart rate a little elevated   Blood sugars doing ok  HgbA1c 7.2 today, up from 6.2 at the last check.   Rash  Started with upper right arm, covering right elbow, and right forearm.  --present for the last 18 months  --now having red, itchy, inflamed rash on the lower legs, below the sock like.   He denies chest pain, chest pressure, or shortness of breath. He denies headaches or visual disturbances. He denies abdominal pain, nausea, vomiting, or changes in bowel or bladder habits.      Medications: Outpatient Medications Prior to Visit  Medication Sig   allopurinol (ZYLOPRIM) 300 MG tablet Take 1 tablet po QD   Blood Glucose Monitoring Suppl (CONTOUR NEXT ONE) KIT 1 each by Does not apply route 2 (two) times daily. Check fasting blood sugar in morning and then recheck blood sugar after largest meal.   CONTOUR NEXT TEST test strip USE TABLET CHECK BLOOD SUGAR THREE TIMES DAILY   empagliflozin (JARDIANCE) 25 MG TABS tablet TAKE 1 TABLET(25 MG) BY MOUTH DAILY BEFORE BREAKFAST   ezetimibe (ZETIA) 10 MG tablet TAKE 1 TABLET BY MOUTH DAILY   Ginseng 100 MG CAPS Take by mouth.   ibuprofen (ADVIL) 800 MG tablet TAKE 1 TABLET(800 MG) BY MOUTH TWICE DAILY AS NEEDED FOR MODERATE PAIN   Insulin Pen Needle (PEN NEEDLES 31GX5/16") 31G X 8 MM MISC 1 each by Does not apply route daily. Use one needle daily with Victoza injection   lisinopril (ZESTRIL) 20 MG tablet TAKE 1 TABLET(20 MG) BY MOUTH DAILY   Multiple Vitamin (MULTIVITAMIN) tablet Take 1 tablet by mouth daily.   Probiotic Product (PROBIOTIC DAILY PO) Take 1 tablet by mouth daily.   sertraline (ZOLOFT) 100 MG  tablet Take 1 tablet (100 mg total) by mouth daily.   Turmeric (QC TUMERIC COMPLEX PO) Take by mouth.   [DISCONTINUED] cyclobenzaprine (FLEXERIL) 10 MG tablet TAKE 1 TABLET(10 MG) BY MOUTH AT BEDTIME AS NEEDED FOR MUSCLE SPASMS   [DISCONTINUED] metFORMIN (GLUCOPHAGE) 1000 MG tablet TAKE 1 TABLET(1000 MG) BY MOUTH TWICE DAILY WITH A MEAL   [DISCONTINUED] rosuvastatin (CRESTOR) 5 MG tablet TAKE 1 TABLET(5 MG) BY MOUTH DAILY   amLODipine (NORVASC) 5 MG tablet TAKE 1 TABLET(5 MG) BY MOUTH DAILY (Patient not taking: Reported on 08/14/2022)   No facility-administered medications prior to visit.    Review of Systems See HPI      Objective     Today's Vitals   08/14/22 1458  BP: 131/87  Pulse: (Abnormal) 106  Resp: 18  SpO2: 96%  Weight: 293 lb (132.9 kg)  Height: '6\' 1"'$  (1.854 m)   Body mass index is 38.66 kg/m.  BP Readings from Last 3 Encounters:  08/14/22 131/87  02/20/22 135/87  12/12/21 111/70    Wt Readings from Last 3 Encounters:  08/14/22 293 lb (132.9 kg)  02/20/22 300 lb (136.1 kg)  12/12/21 300 lb (136.1 kg)    Physical Exam Vitals and nursing note reviewed.  Constitutional:      Appearance: Normal appearance. He is well-developed.  HENT:     Head: Normocephalic  and atraumatic.     Nose: Nose normal.     Mouth/Throat:     Mouth: Mucous membranes are moist.     Pharynx: Oropharynx is clear.  Eyes:     Extraocular Movements: Extraocular movements intact.     Conjunctiva/sclera: Conjunctivae normal.     Pupils: Pupils are equal, round, and reactive to light.  Cardiovascular:     Rate and Rhythm: Normal rate and regular rhythm.     Pulses: Normal pulses.     Heart sounds: Normal heart sounds.  Pulmonary:     Effort: Pulmonary effort is normal.     Breath sounds: Normal breath sounds.  Abdominal:     Palpations: Abdomen is soft.  Musculoskeletal:        General: Normal range of motion.     Cervical back: Normal range of motion and neck supple.   Lymphadenopathy:     Cervical: No cervical adenopathy.  Skin:    General: Skin is warm and dry.     Capillary Refill: Capillary refill takes less than 2 seconds.  Neurological:     General: No focal deficit present.     Mental Status: He is alert and oriented to person, place, and time.  Psychiatric:        Mood and Affect: Mood normal.        Behavior: Behavior normal.        Thought Content: Thought content normal.        Judgment: Judgment normal.     Results for orders placed or performed in visit on 08/14/22  POCT HgB A1C  Result Value Ref Range   Hemoglobin A1C     HbA1c POC (<> result, manual entry) 7.2 4.0 - 5.6 %   HbA1c, POC (prediabetic range)     HbA1c, POC (controlled diabetic range)      Assessment & Plan    1. Controlled type 2 diabetes mellitus with other specified complication, without long-term current use of insulin (HCC) HgbA1c up at 7.9 today. Continue metformin 1000 mg twice daily (has  been taking this once  daily). Limit intake of sugar and carbohydrates. Recheck HgbA1c in 3 months.  - POCT HgB A1C - metFORMIN (GLUCOPHAGE) 1000 MG tablet; TAKE 1 TABLET(1000 MG) BY MOUTH TWICE DAILY WITH A MEAL  Dispense: 60 tablet; Refill: 2  2. Hypertension associated with diabetes ([place]) Blood pressure stable. Continue medication as prescribed   3. Mixed diabetic hyperlipidemia associated with type 2 diabetes mellitus (HCC) Stable. Continue zetia as prescribed   4. Chronic bilateral low back pain without sciatica May take fleseril 10 mg at bedtime if needed for muscle pain and spasms.  - cyclobenzaprine (FLEXERIL) 10 MG tablet; TAKE 1 TABLET(10 MG) BY MOUTH AT BEDTIME AS NEEDED FOR MUSCLE SPASMS  Dispense: 30 tablet; Refill: 2  5. Folliculitis Trial doxycycline 100 mg twice daily for 14 days.  - doxycycline (VIBRA-TABS) 100 MG tablet; Take 1 tablet (100 mg total) by mouth 2 (two) times daily.  Dispense: 28 tablet; Refill: 0  6. Psoriasis Apply clobetasol cream  to effected areas twice daily as needed.  - clobetasol cream (TEMOVATE) 0.05 %; Apply 1 Application topically 2 (two) times daily.  Dispense: 30 g; Refill: 0   Problem List Items Addressed This Visit       Cardiovascular and Mediastinum   Hypertension associated with diabetes ([place])   Relevant Medications   metFORMIN (GLUCOPHAGE) 1000 MG tablet     Endocrine   Diabetes mellitus type II,  controlled ([place]) - Primary   Relevant Medications   metFORMIN (GLUCOPHAGE) 1000 MG tablet   Other Relevant Orders   POCT HgB A1C (Completed)   Mixed diabetic hyperlipidemia associated with type 2 diabetes mellitus (HCC)   Relevant Medications   metFORMIN (GLUCOPHAGE) 1000 MG tablet     Musculoskeletal and Integument   Folliculitis   Relevant Medications   doxycycline (VIBRA-TABS) 100 MG tablet   Psoriasis   Relevant Medications   clobetasol cream (TEMOVATE) 0.05 %     Other   Chronic low back pain without sciatica   Relevant Medications   cyclobenzaprine (FLEXERIL) 10 MG tablet     Return in about 3 months (around 11/12/2022) for diabetes with HgbA1c check. willl get urine microalbumin. he can stay with me .         Ronnell Freshwater, NP  [facility] at [facility] [phone] (phone) [phone] (fax)  [place]

## 2022-09-10 ENCOUNTER — Other Ambulatory Visit: Payer: Self-pay | Admitting: Nurse Practitioner

## 2022-09-10 DIAGNOSIS — L409 Psoriasis, unspecified: Secondary | ICD-10-CM | POA: Insufficient documentation

## 2022-09-10 DIAGNOSIS — F320 Major depressive disorder, single episode, mild: Secondary | ICD-10-CM

## 2022-09-10 DIAGNOSIS — L739 Follicular disorder, unspecified: Secondary | ICD-10-CM | POA: Insufficient documentation

## 2022-09-11 ENCOUNTER — Other Ambulatory Visit: Payer: Self-pay

## 2022-09-11 DIAGNOSIS — I152 Hypertension secondary to endocrine disorders: Secondary | ICD-10-CM

## 2022-09-11 MED ORDER — LISINOPRIL 20 MG PO TABS: ORAL_TABLET | ORAL | 0 refills | Status: DC

## 2022-11-06 ENCOUNTER — Other Ambulatory Visit: Payer: Self-pay | Admitting: Nurse Practitioner

## 2022-11-06 DIAGNOSIS — E1169 Type 2 diabetes mellitus with other specified complication: Secondary | ICD-10-CM

## 2022-11-09 ENCOUNTER — Other Ambulatory Visit: Payer: Self-pay | Admitting: Nurse Practitioner

## 2022-11-09 DIAGNOSIS — E1169 Type 2 diabetes mellitus with other specified complication: Secondary | ICD-10-CM

## 2022-11-20 ENCOUNTER — Ambulatory Visit: Payer: BC Managed Care – PPO | Admitting: Nurse Practitioner

## 2022-12-02 ENCOUNTER — Other Ambulatory Visit: Payer: Self-pay | Admitting: Nurse Practitioner

## 2022-12-02 DIAGNOSIS — M1A9XX0 Chronic gout, unspecified, without tophus (tophi): Secondary | ICD-10-CM

## 2022-12-02 DIAGNOSIS — I152 Hypertension secondary to endocrine disorders: Secondary | ICD-10-CM

## 2022-12-02 DIAGNOSIS — F320 Major depressive disorder, single episode, mild: Secondary | ICD-10-CM

## 2022-12-05 ENCOUNTER — Other Ambulatory Visit: Payer: Self-pay

## 2022-12-05 DIAGNOSIS — E1169 Type 2 diabetes mellitus with other specified complication: Secondary | ICD-10-CM

## 2022-12-05 MED ORDER — EZETIMIBE 10 MG PO TABS: 10.0000 mg | ORAL_TABLET | Freq: Every day | ORAL | 0 refills | Status: DC

## 2022-12-08 ENCOUNTER — Other Ambulatory Visit: Payer: Self-pay

## 2022-12-08 DIAGNOSIS — E1159 Type 2 diabetes mellitus with other circulatory complications: Secondary | ICD-10-CM

## 2022-12-08 MED ORDER — AMLODIPINE BESYLATE 5 MG PO TABS: ORAL_TABLET | ORAL | 1 refills | Status: DC

## 2022-12-11 ENCOUNTER — Ambulatory Visit: Payer: BC Managed Care – PPO | Admitting: Nurse Practitioner

## 2023-01-01 ENCOUNTER — Other Ambulatory Visit: Payer: Self-pay | Admitting: Nurse Practitioner

## 2023-01-01 DIAGNOSIS — G8929 Other chronic pain: Secondary | ICD-10-CM

## 2023-01-01 MED ORDER — CYCLOBENZAPRINE HCL 10 MG PO TABS: ORAL_TABLET | ORAL | 2 refills | Status: DC

## 2023-01-09 ENCOUNTER — Other Ambulatory Visit: Payer: Self-pay | Admitting: Nurse Practitioner

## 2023-01-09 DIAGNOSIS — G8929 Other chronic pain: Secondary | ICD-10-CM

## 2023-01-10 DIAGNOSIS — H8113 Benign paroxysmal vertigo, bilateral: Secondary | ICD-10-CM | POA: Diagnosis not present

## 2023-01-31 ENCOUNTER — Other Ambulatory Visit: Payer: Self-pay | Admitting: Nurse Practitioner

## 2023-01-31 DIAGNOSIS — E1169 Type 2 diabetes mellitus with other specified complication: Secondary | ICD-10-CM

## 2023-01-31 NOTE — Telephone Encounter (Signed)
Patient has another PCP 01/31/2023

## 2023-01-31 NOTE — Telephone Encounter (Signed)
Looks like pt is seeing a  PCP at [facility]. LOV 01/10/23

## 2023-02-11 ENCOUNTER — Other Ambulatory Visit: Payer: Self-pay | Admitting: Nurse Practitioner

## 2023-02-11 DIAGNOSIS — E1169 Type 2 diabetes mellitus with other specified complication: Secondary | ICD-10-CM

## 2023-02-12 NOTE — Telephone Encounter (Signed)
Pt called back after pharmacy told her meds were denied.  Pt said the [facility] provider is from his employer. Pt scheduled appt for 02/19/23 at 10:10.   Prescription Request  02/12/2023  LOV: 08/14/2022  What is the name of the medication or equipment?  metFORMIN (GLUCOPHAGE) 1000 MG tablet   Have you contacted your pharmacy to request a refill? Yes   Which pharmacy would you like this sent to?  [id number] [place], [place] - [phone] AT [facility] OF [facility] & [address] [place] [address] Phone: [phone] Fax: [phone]   Patient notified that their request is being sent to the clinical staff for review and that they should receive a response within 2 business days.   Please advise at Mobile [phone] (mobile)

## 2023-02-13 ENCOUNTER — Ambulatory Visit
Admission: EM | Admit: 2023-02-13 | Discharge: 2023-02-13 | Disposition: A | Discharge status: Home / Self Care | Payer: BC Managed Care – PPO | Attending: Internal Medicine | Admitting: Internal Medicine

## 2023-02-13 DIAGNOSIS — J069 Acute upper respiratory infection, unspecified: Secondary | ICD-10-CM | POA: Diagnosis not present

## 2023-02-13 DIAGNOSIS — R051 Acute cough: Secondary | ICD-10-CM | POA: Diagnosis not present

## 2023-02-13 MED ORDER — FLUTICASONE PROPIONATE 50 MCG/ACT NA SUSP: 1.0000 | Freq: Every day | NASAL | 0 refills | Status: AC

## 2023-02-13 MED ORDER — BENZONATATE 100 MG PO CAPS: 100.0000 mg | ORAL_CAPSULE | Freq: Three times a day (TID) | ORAL | 0 refills | Status: DC | PRN

## 2023-02-13 MED ORDER — AMOXICILLIN-POT CLAVULANATE 875-125 MG PO TABS: 1.0000 | ORAL_TABLET | Freq: Two times a day (BID) | ORAL | 0 refills | Status: DC

## 2023-02-13 NOTE — ED Triage Notes (Signed)
Patient here today with c/o productive cough, headache, and PND X 1 week. Patient has taken Advil cold with some improvement. No sick contacts. No recent travel.

## 2023-02-13 NOTE — ED Provider Notes (Signed)
[facility]    CSN: [id number] Arrival date & time: 02/13/23  0813      History   Chief Complaint Chief Complaint  Patient presents with   Cough    HPI Eddie Hancock is a 52 y.o. male.   Patient presents with approximately 6-day history of headache, productive cough, nasal congestion, nasal drainage.  Reports that sinus pressure in his face has increased and his cough is productive.  Has taken over-the-counter cold and flu medication with only temporary improvement of symptoms.  Denies chest pain or shortness of breath.  Denies history of asthma or COPD.  Patient reports that he was a previous smoker but does not currently smoke cigarettes.  Denies any known sick contacts.   Cough   Past Medical History:  Diagnosis Date   Anxiety    situational   Arthritis    neck/spine   Diabetes mellitus without complication (HCC)    on meds   GERD (gastroesophageal reflux disease)    on meds   Hyperlipidemia    not on meds at this time (11/10/2021)   Hypertension    on meds   Obesity    Seasonal allergies     Patient Active Problem List   Diagnosis Date Noted   Folliculitis 09/10/2022   Psoriasis 09/10/2022   Depression, major, single episode, mild (HCC) 10/17/2021   Hypertension associated with diabetes (HCC) 09/11/2019   Mixed diabetic hyperlipidemia associated with type 2 diabetes mellitus (HCC) 09/11/2019   Acute pancreatitis 09/11/2019   BMI 40.0-44.9, adult (HCC) 07/15/2018   Morbid obesity (HCC) 07/15/2018   Insomnia 11/29/2017   Diabetes mellitus type II, controlled (HCC) 06/21/2017   Hyperlipidemia associated with type 2 diabetes mellitus (HCC) 06/21/2017   Healthcare maintenance 06/21/2017   Contact dermatitis 10/17/2016   Benign essential hypertension 11/04/2015   Chronic low back pain without sciatica 11/04/2015   Hyperglycemia 11/04/2015   Hypertriglyceridemia 11/04/2015   Seasonal allergic rhinitis due to pollen 11/04/2015    Past Surgical  History:  Procedure Laterality Date   APPENDECTOMY         Home Medications    Prior to Admission medications   Medication Sig Start Date End Date Taking? Authorizing Provider  allopurinol (ZYLOPRIM) 300 MG tablet TAKE 1 TABLET BY MOUTH EVERY DAY 12/05/22  Yes Boscia, Heather E, NP  amoxicillin-clavulanate (AUGMENTIN) 875-125 MG tablet Take 1 tablet by mouth every 12 (twelve) hours. 02/13/23  Yes Jaideep Pollack, Rolly Salter E, FNP  benzonatate (TESSALON) 100 MG capsule Take 1 capsule (100 mg total) by mouth every 8 (eight) hours as needed for cough. 02/13/23  Yes Tavia Stave, Rolly Salter E, FNP  cyclobenzaprine (FLEXERIL) 10 MG tablet TAKE 1 TABLET(10 MG) BY MOUTH AT BEDTIME AS NEEDED FOR MUSCLE SPASMS 01/01/23  Yes Boscia, Kathlynn Grate, NP  ezetimibe (ZETIA) 10 MG tablet Take 1 tablet (10 mg total) by mouth daily. 12/05/22  Yes Boscia, Heather E, NP  fluticasone (FLONASE) 50 MCG/ACT nasal spray Place 1 spray into both nostrils daily. 02/13/23  Yes Treniece Holsclaw, Rolly Salter E, FNP  Ginseng 100 MG CAPS Take by mouth.   Yes [provider]  ibuprofen (ADVIL) 800 MG tablet TAKE 1 TABLET(800 MG) BY MOUTH TWICE DAILY AS NEEDED FOR MODERATE PAIN 08/30/21  Yes Abonza, Maritza, PA-C  JARDIANCE 25 MG TABS tablet TAKE 1 TABLET(25 MG) BY MOUTH DAILY BEFORE BREAKFAST 12/05/22  Yes Boscia, Heather E, NP  lisinopril (ZESTRIL) 20 MG tablet TAKE 1 TABLET(20 MG) BY MOUTH DAILY 12/05/22  Yes Vincent Gros  E, NP  metFORMIN (GLUCOPHAGE) 1000 MG tablet TAKE 1 TABLET(1000 MG) BY MOUTH TWICE DAILY WITH A MEAL 02/13/23  Yes Sandre Kitty, MD  Multiple Vitamin (MULTIVITAMIN) tablet Take 1 tablet by mouth daily.   Yes [provider]  Probiotic Product (PROBIOTIC DAILY PO) Take 1 tablet by mouth daily.   Yes [provider]  rosuvastatin (CRESTOR) 5 MG tablet TAKE 1 TABLET(5 MG) BY MOUTH DAILY 11/09/22  Yes Boscia, Heather E, NP  sertraline (ZOLOFT) 100 MG tablet TAKE 1 TABLET(100 MG) BY MOUTH DAILY 12/05/22  Yes Boscia, Kathlynn Grate, NP   Turmeric (QC TUMERIC COMPLEX PO) Take by mouth.   Yes [provider]  amLODipine (NORVASC) 5 MG tablet TAKE 1 TABLET(5 MG) BY MOUTH DAILY 12/08/22   Carlean Jews, NP  Blood Glucose Monitoring Suppl (CONTOUR NEXT ONE) KIT 1 each by Does not apply route 2 (two) times daily. Check fasting blood sugar in morning and then recheck blood sugar after largest meal. 10/02/19   Opalski, Gavin Pound, DO  clobetasol cream (TEMOVATE) 0.05 % Apply 1 Application topically 2 (two) times daily. 08/14/22   Carlean Jews, NP  CONTOUR NEXT TEST test strip USE TABLET CHECK BLOOD SUGAR THREE TIMES DAILY 01/23/22   Mayer Masker, PA-C  Insulin Pen Needle (PEN NEEDLES 31GX5/16") 31G X 8 MM MISC 1 each by Does not apply route daily. Use one needle daily with Victoza injection 06/10/19   Danford, Orpha Bur D, NP  liraglutide (VICTOZA) 18 MG/3ML SOPN Inject 0.1 mLs (0.6 mg total) into the skin daily for 7 days, THEN 0.2 mLs (1.2 mg total) daily for 7 days, THEN 0.2 mLs (1.2 mg total) daily for 7 days. 04/29/19 07/18/19  William Hamburger D, NP    Family History Family History  Problem Relation Age of Onset   Hypertension Mother    Learning disabilities Maternal Grandmother    Learning disabilities Maternal Grandfather    Diabetes Daughter    Colon polyps Neg Hx    Colon cancer Neg Hx    Esophageal cancer Neg Hx    Stomach cancer Neg Hx    Rectal cancer Neg Hx     Social History Social History   Tobacco Use   Smoking status: Former    Current packs/day: 0.00    Types: Cigarettes    Quit date: 11/07/2016    Years since quitting: 6.2    Passive exposure: Never   Smokeless tobacco: Never  Vaping Use   Vaping status: Never Used  Substance Use Topics   Alcohol use: Yes    Comment: rare   Drug use: No     Allergies   Cetirizine, Liraglutide, and Onglyza [saxagliptin]   Review of Systems Review of Systems Per HPI  Physical Exam Triage Vital Signs ED Triage Vitals  Encounter Vitals Group     BP  02/13/23 0832 132/85     Systolic BP Percentile --      Diastolic BP Percentile --      Pulse Rate 02/13/23 0832 96     Resp 02/13/23 0832 16     Temp 02/13/23 0832 97.9 F (36.6 C)     Temp Source 02/13/23 0832 Oral     SpO2 02/13/23 0832 96 %     Weight 02/13/23 0832 293 lb (132.9 kg)     Height 02/13/23 0832 6\' 1"  (1.854 m)     Head Circumference --      Peak Flow --      Pain  Score 02/13/23 0831 4     Pain Loc --      Pain Education --      Exclude from Growth Chart --    No data found.  Updated Vital Signs BP 132/85 (BP Location: Left Arm)   Pulse 96   Temp 97.9 F (36.6 C) (Oral)   Resp 16   Ht 6\' 1"  (1.854 m)   Wt 293 lb (132.9 kg)   SpO2 96%   BMI 38.66 kg/m   Visual Acuity Right Eye Distance:   Left Eye Distance:   Bilateral Distance:    Right Eye Near:   Left Eye Near:    Bilateral Near:     Physical Exam Constitutional:      General: He is not in acute distress.    Appearance: Normal appearance. He is not toxic-appearing or diaphoretic.  HENT:     Head: Normocephalic and atraumatic.     Right Ear: Tympanic membrane and ear canal normal.     Left Ear: Tympanic membrane and ear canal normal.     Nose: Congestion present.     Mouth/Throat:     Mouth: Mucous membranes are moist.     Pharynx: No posterior oropharyngeal erythema.  Eyes:     Extraocular Movements: Extraocular movements intact.     Conjunctiva/sclera: Conjunctivae normal.     Pupils: Pupils are equal, round, and reactive to light.  Cardiovascular:     Rate and Rhythm: Normal rate and regular rhythm.     Pulses: Normal pulses.     Heart sounds: Normal heart sounds.  Pulmonary:     Effort: Pulmonary effort is normal. No respiratory distress.     Breath sounds: Normal breath sounds. No stridor. No wheezing, rhonchi or rales.  Abdominal:     General: Abdomen is flat. Bowel sounds are normal.     Palpations: Abdomen is soft.  Musculoskeletal:        General: Normal range of motion.      Cervical back: Normal range of motion.  Skin:    General: Skin is warm and dry.  Neurological:     General: No focal deficit present.     Mental Status: He is alert and oriented to person, place, and time. Mental status is at baseline.  Psychiatric:        Mood and Affect: Mood normal.        Behavior: Behavior normal.      UC Treatments / Results  Labs (all labs ordered are listed, but only abnormal results are displayed) Labs Reviewed - No data to display  EKG   Radiology No results found.  Procedures Procedures (including critical care time)  Medications Ordered in UC Medications - No data to display  Initial Impression / Assessment and Plan / UC Course  I have reviewed the triage vital signs and the nursing notes.  Pertinent labs & imaging results that were available during my care of the patient were reviewed by me and considered in my medical decision making (see chart for details).     Symptoms most likely started off as a viral illness but given duration of symptoms and increase in sinus pressure in face, will treat for sinus infection with Augmentin antibiotic.  Flonase and benzonatate prescribed to help alleviate symptoms as well.  Viral testing deferred given duration of symptoms as it would not change treatment.  There are no adventitious lung sounds on exam so do not think that chest imaging is necessary.  Advised strict return precautions.  Patient verbalized understanding and was agreeable with plan. Final Clinical Impressions(s) / UC Diagnoses   Final diagnoses:  Acute upper respiratory infection  Acute cough     Discharge Instructions      I have prescribed you an antibiotic, nasal spray, cough medication for upper respiratory infection.  Please follow-up if any symptoms persist or worsen.    ED Prescriptions     Medication Sig Dispense Auth. Provider   amoxicillin-clavulanate (AUGMENTIN) 875-125 MG tablet Take 1 tablet by mouth every 12  (twelve) hours. 14 tablet [place], [place] E, [place]   fluticasone [facility]) 50 MCG/ACT nasal spray Place 1 spray into both nostrils daily. 16 g Deaundra Kutzer, Rolly Salter E, [place]   benzonatate (TESSALON) 100 MG capsule Take 1 capsule (100 mg total) by mouth every 8 (eight) hours as needed for cough. 21 capsule [place], Acie Fredrickson, [place]      PDMP not reviewed this encounter.   Gustavus Bryant, [place] 02/13/23 [phone]

## 2023-02-13 NOTE — Discharge Instructions (Signed)
I have prescribed you an antibiotic, nasal spray, cough medication for upper respiratory infection.  Please follow-up if any symptoms persist or worsen.

## 2023-02-14 ENCOUNTER — Other Ambulatory Visit: Payer: Self-pay

## 2023-02-14 DIAGNOSIS — E1169 Type 2 diabetes mellitus with other specified complication: Secondary | ICD-10-CM

## 2023-02-14 MED ORDER — EZETIMIBE 10 MG PO TABS
10.0000 mg | ORAL_TABLET | Freq: Every day | ORAL | 0 refills | Status: DC
Start: 2023-02-14 — End: 2023-05-21

## 2023-02-18 NOTE — Progress Notes (Unsigned)
   Established Patient Office Visit  Subjective   Patient ID: Eddie Hancock, male    DOB: 01-08-1971  Age: 52 y.o. MRN: [id number]  No chief complaint on file.   HPI  DM2 - jardiance, metformin,   HLD - crestor, zetia  HTN - lisinopril, amlodipine   The 10-year ASCVD risk score (Arnett DK, et al., 2019) is: 31.4%  Health Maintenance Due  Topic Date Due   HIV Screening  Never done   Hepatitis C Screening  Never done   OPHTHALMOLOGY EXAM  09/03/2018   Zoster Vaccines- Shingrix (1 of 2) Never done   Diabetic kidney evaluation - Urine ACR  06/15/2022   FOOT EXAM  10/18/2022   INFLUENZA VACCINE  02/08/2023   HEMOGLOBIN A1C  02/12/2023   Diabetic kidney evaluation - eGFR measurement  02/21/2023      Objective:     There were no vitals taken for this visit. {Vitals History (Optional):23777}  Physical Exam   No results found for any visits on 02/19/23.      Assessment & Plan:   There are no diagnoses linked to this encounter.   No follow-ups on file.    Sandre Kitty, MD

## 2023-02-19 ENCOUNTER — Encounter: Payer: Self-pay | Admitting: Family Medicine

## 2023-02-19 ENCOUNTER — Ambulatory Visit: Payer: BC Managed Care – PPO | Admitting: Family Medicine

## 2023-02-19 VITALS — BP 125/87 | HR 89 | Resp 18 | Ht 73.0 in | Wt 298.0 lb

## 2023-02-19 DIAGNOSIS — B360 Pityriasis versicolor: Secondary | ICD-10-CM

## 2023-02-19 DIAGNOSIS — E1159 Type 2 diabetes mellitus with other circulatory complications: Secondary | ICD-10-CM | POA: Diagnosis not present

## 2023-02-19 DIAGNOSIS — M542 Cervicalgia: Secondary | ICD-10-CM

## 2023-02-19 DIAGNOSIS — E1169 Type 2 diabetes mellitus with other specified complication: Secondary | ICD-10-CM

## 2023-02-19 DIAGNOSIS — E782 Mixed hyperlipidemia: Secondary | ICD-10-CM

## 2023-02-19 DIAGNOSIS — Z6841 Body Mass Index (BMI) 40.0 and over, adult: Secondary | ICD-10-CM | POA: Diagnosis not present

## 2023-02-19 DIAGNOSIS — G8929 Other chronic pain: Secondary | ICD-10-CM

## 2023-02-19 DIAGNOSIS — I152 Hypertension secondary to endocrine disorders: Secondary | ICD-10-CM | POA: Diagnosis not present

## 2023-02-19 DIAGNOSIS — Z7984 Long term (current) use of oral hypoglycemic drugs: Secondary | ICD-10-CM

## 2023-02-19 LAB — POCT GLYCOSYLATED HEMOGLOBIN (HGB A1C): HbA1c POC (<> result, manual entry): 7.1 % (ref 4.0–5.6)

## 2023-02-19 MED ORDER — KETOCONAZOLE 2 % EX CREA: 1.0000 | TOPICAL_CREAM | Freq: Every day | CUTANEOUS | 2 refills | Status: DC
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
## 2023-02-19 NOTE — Assessment & Plan Note (Signed)
Bp good today.  Nott taking amlodipine. Continue lisinopril

## 2023-02-19 NOTE — Assessment & Plan Note (Signed)
Large rash on upper back seems consistent with tinea infection.   - ketoconazole 2% daily until resolves.

## 2023-02-19 NOTE — Assessment & Plan Note (Signed)
Elevated cholesterol last visit with significantly elevated triglycerides.  Endorses complaince with statin and zetia.  Recheck today.  Adjust medication as necessary.  Consider fibrate if still hypertriglyceridemic.

## 2023-02-19 NOTE — Patient Instructions (Addendum)
It was nice to see you today,  We addressed the following topics today: -We will recheck some of your labs today for cholesterol and kidney and liver function. - I will send in a referral to the dermatologist as well as referral to physical medicine rehab.  If you have not heard from either of these in 2 weeks let us know and we can give you their information so that you can reach out to them - I would like to see back in 3 months to recheck your A1c and cholesterol - I will send in a prescription for an antifungal cream I would like you to use this every day for at least 2 weeks.  Continue using until the rash resolves.  After the rash resolves you can switch to using it twice a week.  Have a great day,  Frederic Jericho, MD

## 2023-02-20 LAB — MICROALBUMIN / CREATININE URINE RATIO
Creatinine, Urine: 66.5 mg/dL
Microalb/Creat Ratio: <5 mg/g{creat} (ref 0–29)
Microalbumin, Urine: <3 ug/mL

## 2023-02-28 ENCOUNTER — Encounter: Payer: Self-pay | Admitting: Physical Medicine & Rehabilitation

## 2023-03-01 ENCOUNTER — Other Ambulatory Visit: Payer: Self-pay | Admitting: Nurse Practitioner

## 2023-03-01 DIAGNOSIS — I152 Hypertension secondary to endocrine disorders: Secondary | ICD-10-CM

## 2023-03-01 DIAGNOSIS — F320 Major depressive disorder, single episode, mild: Secondary | ICD-10-CM

## 2023-03-01 DIAGNOSIS — M1A9XX0 Chronic gout, unspecified, without tophus (tophi): Secondary | ICD-10-CM

## 2023-03-15 ENCOUNTER — Other Ambulatory Visit: Payer: Self-pay

## 2023-03-15 MED ORDER — EMPAGLIFLOZIN 25 MG PO TABS: 25.0000 mg | ORAL_TABLET | Freq: Every day | ORAL | 0 refills | Status: DC

## 2023-03-30 ENCOUNTER — Other Ambulatory Visit: Payer: Self-pay | Admitting: Family Medicine

## 2023-03-30 ENCOUNTER — Other Ambulatory Visit: Payer: Self-pay | Admitting: Nurse Practitioner

## 2023-03-30 DIAGNOSIS — G8929 Other chronic pain: Secondary | ICD-10-CM

## 2023-04-05 ENCOUNTER — Encounter: Payer: Self-pay | Admitting: Physical Medicine & Rehabilitation

## 2023-04-05 ENCOUNTER — Encounter
Payer: BC Managed Care – PPO | Attending: Physical Medicine & Rehabilitation | Admitting: Physical Medicine & Rehabilitation

## 2023-04-05 VITALS — BP 143/96 | Ht 73.0 in | Wt 297.0 lb

## 2023-04-05 DIAGNOSIS — F320 Major depressive disorder, single episode, mild: Secondary | ICD-10-CM | POA: Diagnosis not present

## 2023-04-05 DIAGNOSIS — M545 Low back pain, unspecified: Secondary | ICD-10-CM | POA: Diagnosis not present

## 2023-04-05 DIAGNOSIS — M1A9XX0 Chronic gout, unspecified, without tophus (tophi): Secondary | ICD-10-CM | POA: Insufficient documentation

## 2023-04-05 DIAGNOSIS — G8929 Other chronic pain: Secondary | ICD-10-CM | POA: Diagnosis not present

## 2023-04-05 DIAGNOSIS — M25562 Pain in left knee: Secondary | ICD-10-CM | POA: Diagnosis not present

## 2023-04-05 DIAGNOSIS — M25511 Pain in right shoulder: Secondary | ICD-10-CM | POA: Insufficient documentation

## 2023-04-05 DIAGNOSIS — M542 Cervicalgia: Secondary | ICD-10-CM | POA: Diagnosis not present

## 2023-04-05 MED ORDER — GABAPENTIN 300 MG PO CAPS: 300.0000 mg | ORAL_CAPSULE | Freq: Three times a day (TID) | ORAL | 1 refills | Status: DC

## 2023-04-30 ENCOUNTER — Other Ambulatory Visit: Payer: Self-pay | Admitting: Family Medicine

## 2023-04-30 DIAGNOSIS — E1169 Type 2 diabetes mellitus with other specified complication: Secondary | ICD-10-CM

## 2023-05-14 ENCOUNTER — Telehealth: Payer: Self-pay | Admitting: *Deleted

## 2023-05-14 ENCOUNTER — Other Ambulatory Visit: Payer: Self-pay | Admitting: Family Medicine

## 2023-05-14 DIAGNOSIS — E1169 Type 2 diabetes mellitus with other specified complication: Secondary | ICD-10-CM

## 2023-05-14 NOTE — Telephone Encounter (Signed)
Pt calling to see why Rx was denied, informed him that it looked like it was sent in on 10/21.  I contacted pharmacy and they had not received it so I called it in verbally.  Routing to PCP as an Financial planner.

## 2023-05-21 ENCOUNTER — Encounter: Payer: Self-pay | Admitting: Family Medicine

## 2023-05-21 ENCOUNTER — Ambulatory Visit (INDEPENDENT_AMBULATORY_CARE_PROVIDER_SITE_OTHER): Payer: BC Managed Care – PPO | Admitting: Family Medicine

## 2023-05-21 VITALS — BP 126/85 | HR 84 | Ht 73.0 in | Wt 303.8 lb

## 2023-05-21 DIAGNOSIS — E118 Type 2 diabetes mellitus with unspecified complications: Secondary | ICD-10-CM

## 2023-05-21 DIAGNOSIS — Z7984 Long term (current) use of oral hypoglycemic drugs: Secondary | ICD-10-CM

## 2023-05-21 DIAGNOSIS — E1169 Type 2 diabetes mellitus with other specified complication: Secondary | ICD-10-CM

## 2023-05-21 DIAGNOSIS — B360 Pityriasis versicolor: Secondary | ICD-10-CM

## 2023-05-21 DIAGNOSIS — E782 Mixed hyperlipidemia: Secondary | ICD-10-CM | POA: Diagnosis not present

## 2023-05-21 DIAGNOSIS — Z23 Encounter for immunization: Secondary | ICD-10-CM

## 2023-05-21 LAB — POCT GLYCOSYLATED HEMOGLOBIN (HGB A1C): HbA1c POC (<> result, manual entry): 7.6 % (ref 4.0–5.6)

## 2023-05-21 MED ORDER — ROSUVASTATIN CALCIUM 5 MG PO TABS: 5.0000 mg | ORAL_TABLET | Freq: Every day | ORAL | 3 refills | Status: DC

## 2023-05-21 MED ORDER — EZETIMIBE 10 MG PO TABS: 10.0000 mg | ORAL_TABLET | Freq: Every day | ORAL | 3 refills | Status: DC

## 2023-05-21 NOTE — Assessment & Plan Note (Signed)
Patient states rash resolved after treatment with ketoconazole.

## 2023-05-21 NOTE — Progress Notes (Signed)
   Established Patient Office Visit  Subjective   Patient ID: Eddie Hancock, male    DOB: 1971-04-13  Age: 52 y.o. MRN: [id number]  Chief Complaint  Patient presents with   Medical Management of Chronic Issues    HPI  DM2-patient has been taking his medication regularly.  No questions or concerns.  He states that sometimes he will notice some LE neuropathy at the end of the day when he is laying in bed at night.  This is not every night, only occasionally.  Patient's A1c was slightly worse.  He attributes this to worse eating habits recently.  He would like to adjust his diet prior to starting any new medications.   Patient states that the rash resolved after we used to the cream I prescribed.  Has not returned.  Hyperlipidemia-patient states that he is no longer taking his zetia or rosuvastatin because the last time he tried to have it refilled it was not renewed.  We discussed needing to continue these medications.  This was about 2.5 months ago.   The ASCVD Risk score (Arnett DK, et al., 2019) failed to calculate for the following reasons:   The valid total cholesterol range is 130 to 320 mg/dL  Health Maintenance Due  Topic Date Due   HIV Screening  Never done   Hepatitis C Screening  Never done   OPHTHALMOLOGY EXAM  09/03/2018   Zoster Vaccines- Shingrix (1 of 2) Never done      Objective:     BP 126/85   Pulse 84   Ht 6\' 1"  (1.854 m)   Wt (!) 303 lb 12.8 oz (137.8 kg)   SpO2 98%   BMI 40.08 kg/m    Physical Exam General: Alert, oriented Pulmonary: No respiratory stress Extremities: Normal foot exam.  Normal pulses bilaterally.  No ulcerations.   No results found for any visits on 05/21/23.      Assessment & Plan:   Controlled type 2 diabetes mellitus with complication, without long-term current use of insulin (HCC) Assessment & Plan: A1c on 7.6.  Slightly worse.  Patient attributes to worsening of his eating habits.  Patient will prefer to manage his  diabetes with his diet and the current medications he is taking And rechecking in 3 months before he decides to start a new medication.   Tinea versicolor Assessment & Plan: Patient states rash resolved after treatment with ketoconazole.   Morbid obesity (HCC)  Mixed diabetic hyperlipidemia associated with type 2 diabetes mellitus (HCC) Assessment & Plan: Patient has not been taking his cholesterol medicine for the last 2 and half months because somehow the refill got denied.  Discussed needing to continue this medication.  Refill sent in to last for 1 year.  Orders: -     Ezetimibe; Take 1 tablet (10 mg total) by mouth daily.  Dispense: 90 tablet; Refill: 3 -     Rosuvastatin Calcium; Take 1 tablet (5 mg total) by mouth daily.  Dispense: 90 tablet; Refill: 3  Need for influenza vaccination -     Flu vaccine trivalent PF, 6mos and older(Flulaval,Afluria,Fluarix,Fluzone)     Return in about 3 months (around 08/21/2023) for hld, DM.    Sandre Kitty, MD

## 2023-05-21 NOTE — Patient Instructions (Addendum)
It was nice to see you today,  We addressed the following topics today: -You should get your diabetic eye exam yearly the same way you do with your foot exam and urine protein testing. - Your first A1c was canceled out, we will let you know the results of your repeat test when we get it. - I have sent in a new prescription for both your Zetia and Crestor that we will have enough refills for 1 year.  Have a great day,  Frederic Jericho, MD

## 2023-05-21 NOTE — Assessment & Plan Note (Signed)
A1c on 7.6.  Slightly worse.  Patient attributes to worsening of his eating habits.  Patient will prefer to manage his diabetes with his diet and the current medications he is taking And rechecking in 3 months before he decides to start a new medication.

## 2023-05-21 NOTE — Assessment & Plan Note (Signed)
Patient has not been taking his cholesterol medicine for the last 2 and half months because somehow the refill got denied.  Discussed needing to continue this medication.  Refill sent in to last for 1 year.

## 2023-06-01 ENCOUNTER — Other Ambulatory Visit: Payer: Self-pay | Admitting: Family Medicine

## 2023-06-01 DIAGNOSIS — M1A9XX0 Chronic gout, unspecified, without tophus (tophi): Secondary | ICD-10-CM

## 2023-06-01 DIAGNOSIS — I152 Hypertension secondary to endocrine disorders: Secondary | ICD-10-CM

## 2023-06-04 ENCOUNTER — Encounter
Payer: BC Managed Care – PPO | Attending: Physical Medicine & Rehabilitation | Admitting: Physical Medicine & Rehabilitation

## 2023-06-04 DIAGNOSIS — M25562 Pain in left knee: Secondary | ICD-10-CM | POA: Insufficient documentation

## 2023-06-04 DIAGNOSIS — M545 Low back pain, unspecified: Secondary | ICD-10-CM | POA: Insufficient documentation

## 2023-06-04 DIAGNOSIS — M25511 Pain in right shoulder: Secondary | ICD-10-CM | POA: Insufficient documentation

## 2023-06-04 DIAGNOSIS — G8929 Other chronic pain: Secondary | ICD-10-CM | POA: Insufficient documentation

## 2023-06-04 DIAGNOSIS — M1A9XX0 Chronic gout, unspecified, without tophus (tophi): Secondary | ICD-10-CM | POA: Insufficient documentation

## 2023-06-04 DIAGNOSIS — M542 Cervicalgia: Secondary | ICD-10-CM | POA: Insufficient documentation

## 2023-06-04 DIAGNOSIS — F320 Major depressive disorder, single episode, mild: Secondary | ICD-10-CM | POA: Insufficient documentation

## 2023-06-09 ENCOUNTER — Ambulatory Visit (HOSPITAL_COMMUNITY)
Admission: RE | Admit: 2023-06-09 | Discharge: 2023-06-09 | Disposition: A | Discharge status: Home / Self Care | Payer: BC Managed Care – PPO | Source: Ambulatory Visit | Attending: Physical Medicine & Rehabilitation | Admitting: Physical Medicine & Rehabilitation

## 2023-06-09 ENCOUNTER — Other Ambulatory Visit: Payer: Self-pay | Admitting: Physical Medicine & Rehabilitation

## 2023-06-09 DIAGNOSIS — M542 Cervicalgia: Secondary | ICD-10-CM | POA: Diagnosis not present

## 2023-06-09 DIAGNOSIS — M4722 Other spondylosis with radiculopathy, cervical region: Secondary | ICD-10-CM | POA: Diagnosis not present

## 2023-06-09 DIAGNOSIS — M5013 Cervical disc disorder with radiculopathy, cervicothoracic region: Secondary | ICD-10-CM | POA: Diagnosis not present

## 2023-06-09 DIAGNOSIS — M4802 Spinal stenosis, cervical region: Secondary | ICD-10-CM | POA: Diagnosis not present

## 2023-06-09 DIAGNOSIS — M50122 Cervical disc disorder at C5-C6 level with radiculopathy: Secondary | ICD-10-CM | POA: Diagnosis not present

## 2023-06-11 ENCOUNTER — Telehealth: Payer: Self-pay | Admitting: Physical Medicine & Rehabilitation

## 2023-06-11 MED ORDER — GABAPENTIN 300 MG PO CAPS: 300.0000 mg | ORAL_CAPSULE | Freq: Three times a day (TID) | ORAL | 1 refills | Status: DC

## 2023-06-11 NOTE — Telephone Encounter (Signed)
Refill sent to [facility]. He has an appt 06/18/23

## 2023-06-11 NOTE — Telephone Encounter (Signed)
Patient called in requesting medication refill on gabapentin , patient states he has been out for 4 days now and pain is coming back

## 2023-06-11 NOTE — Addendum Note (Signed)
Addended by: Doreene Eland on: 06/11/2023 10:27 AM   Modules accepted: Orders

## 2023-06-18 ENCOUNTER — Encounter: Payer: Self-pay | Admitting: Physical Medicine & Rehabilitation

## 2023-06-18 ENCOUNTER — Encounter
Payer: BC Managed Care – PPO | Attending: Physical Medicine & Rehabilitation | Admitting: Physical Medicine & Rehabilitation

## 2023-06-18 VITALS — BP 117/77 | HR 107 | Ht 73.0 in | Wt 304.6 lb

## 2023-06-18 DIAGNOSIS — F320 Major depressive disorder, single episode, mild: Secondary | ICD-10-CM | POA: Insufficient documentation

## 2023-06-18 DIAGNOSIS — M25511 Pain in right shoulder: Secondary | ICD-10-CM | POA: Diagnosis not present

## 2023-06-18 DIAGNOSIS — M25562 Pain in left knee: Secondary | ICD-10-CM | POA: Insufficient documentation

## 2023-06-18 DIAGNOSIS — M542 Cervicalgia: Secondary | ICD-10-CM | POA: Insufficient documentation

## 2023-06-18 DIAGNOSIS — M545 Low back pain, unspecified: Secondary | ICD-10-CM | POA: Insufficient documentation

## 2023-06-18 DIAGNOSIS — G8929 Other chronic pain: Secondary | ICD-10-CM | POA: Diagnosis not present

## 2023-06-18 MED ORDER — GABAPENTIN 400 MG PO CAPS: 400.0000 mg | ORAL_CAPSULE | Freq: Three times a day (TID) | ORAL | 4 refills | Status: DC

## 2023-06-18 NOTE — Progress Notes (Signed)
Subjective:    Patient ID: Eddie Hancock, male    DOB: December 28, 1970, 52 y.o.   MRN: [id number]  HPI  Eddie Hancock is a 52 y.o. year old male  who  has a past medical history of Anxiety, Arthritis, Diabetes mellitus without complication (HCC), GERD (gastroesophageal reflux disease), Hyperlipidemia, Hypertension, Obesity, and Seasonal allergies.   They are presenting to PM&R clinic as a new patient for pain management evaluation. They were referred by Dr. Constance Goltz for treatment of chronic neck pain.  Patient reports his neck pain started about 3 years ago and then became severe about 2 years ago. He reports of gravel like feeling in his neck.  Patient is well his whole spine hurts has pain in his lower back however the neck is where the pain is the worst.  Sometimes the pain will shoot into his shoulders and upper arms.  He says sometimes his arms will fall asleep during the day.  He works a very physical job [place].  His back will often be sore after work.  Patient says he does not want medications that are too strong because he does not want them to affect his focus at work.  He reports he is seeing [facility] Dr. Magnus Ivan.  Patient says he was told he did not need surgery at that time.  Patient reports he also has had issues with pain in his left knee and left hip.  Pain in these areas comes and goes.  He also has elbow pain.  Furthermore he reports pain with his right shoulder due to the injury that he sustained as a child.  He has a history of gout and says that affects his ankles, knees and feet.  He reports history of chronic anxiety, on Zoloft.   Red flag symptoms: No red flags for back pain endorsed in Hx or ROS  Medications tried: Topical medications benzocaine- helps a little, icy hot- doesn thelp Nsaids Celebrex,Relafen, Ibuprofen- all help about the same. He takes ibuprofen as it is less expensive  Ibuprofen- takes 600 to 800 mg TID  Tylenol  Helps a  little, takes in the AM Opiates Denies  Gabapentin / Lyrica - denies  TCAs - Denies  SNRIs- Denies  Robaxin helps a little  Flexeril will help however he will take this after work because it can be sedating for him Turmeric helps  Other treatments: PT- helps for that day only, not long term benefit  TENs unit - helps  Injections - Denies     Interval history 06/18/2023 Eddie Hancock is here for follow-up of his chronic pain in his neck, lower back and several other joints.  Patient reports that since starting gabapentin his neck range of motion his significantly improved.  He is noticing been able to look left and right much further than he was able to previously.  Patient says he did not notice it until asked him today but he thinks pain in his joints reported previously is also decreased from prior visits.  He did develop some lower back pain about a week ago and he thinks this occurred when he was out of the gabapentin briefly.  He is not having any side effects with gabapentin.  Prior UDS results: No results found for: "LABOPIA", "COCAINSCRNUR", "LABBENZ", "AMPHETMU", "THCU", "LABBARB"   Pain Inventory Average Pain 4 Pain Right Now 6 My pain is sharp, stabbing, tingling, and aching  In the last 24 hours, has pain interfered with  the following? General activity 7 Relation with others 5 Enjoyment of life 7 What TIME of day is your pain at its worst? morning , daytime, and evening Sleep (in general) Fair  Pain is worse with: walking, bending, standing, and some activites Pain improves with: rest Relief from Meds: 0    Family History  Problem Relation Age of Onset   Hypertension Mother    Learning disabilities Maternal Grandmother    Learning disabilities Maternal Grandfather    Diabetes Daughter    Colon polyps Neg Hx    Colon cancer Neg Hx    Esophageal cancer Neg Hx    Stomach cancer Neg Hx    Rectal cancer Neg Hx    Social History   Socioeconomic History   Marital  status: Married    Spouse name: Not on file   Number of children: Not on file   Years of education: Not on file   Highest education level: Not on file  Occupational History   Not on file  Tobacco Use   Smoking status: Former    Current packs/day: 0.00    Types: Cigarettes    Quit date: 11/07/2016    Years since quitting: 6.6    Passive exposure: Never   Smokeless tobacco: Never  Vaping Use   Vaping status: Never Used  Substance and Sexual Activity   Alcohol use: Yes    Comment: rare   Drug use: No   Sexual activity: Not on file  Other Topics Concern   Not on file  Social History Narrative   Not on file   Social Determinants of Health   Financial Resource Strain: Not on file  Food Insecurity: Not on file  Transportation Needs: Not on file  Physical Activity: Not on file  Stress: Not on file  Social Connections: Not on file   Past Surgical History:  Procedure Laterality Date   APPENDECTOMY     Past Medical History:  Diagnosis Date   Anxiety    situational   Arthritis    neck/spine   Diabetes mellitus without complication (HCC)    on meds   GERD (gastroesophageal reflux disease)    on meds   Hyperlipidemia    not on meds at this time (11/10/2021)   Hypertension    on meds   Obesity    Seasonal allergies    BP 117/77   Pulse (!) 107   Ht 6\' 1"  (1.854 m)   Wt (!) 304 lb 9.6 oz (138.2 kg)   SpO2 97%   BMI 40.19 kg/m   Opioid Risk Score:   Fall Risk Score:  `1  Depression screen [facility] 2/9     06/18/2023   11:45 AM 05/21/2023    8:15 AM 04/05/2023    2:57 PM 02/19/2023   10:30 AM 08/14/2022    3:02 PM 02/20/2022    8:09 AM 10/17/2021    8:16 AM  Depression screen PHQ 2/9  Decreased Interest 0 0 0 0 1 0 1  Down, Depressed, Hopeless 0 0 0 0 1 0 0  PHQ - 2 Score 0 0 0 0 2 0 1  Altered sleeping  0 1 0 1 1 0  Tired, decreased energy  2 1 0 1 1 1   Change in appetite  0 0 0 1 0 0  Feeling bad or failure about yourself   0 0 0 1 1 0  Trouble concentrating   0 0 0 1 1 0  Moving slowly or  fidgety/restless  0 0 0 0 0 0  Suicidal thoughts  0 0 0 0 0 0  PHQ-9 Score  2 2 0 7 4 2   Difficult doing work/chores  Somewhat difficult Not difficult at all Not difficult at all Not difficult at all Somewhat difficult Somewhat difficult      Review of Systems  Constitutional: Negative.   HENT: Negative.    Respiratory: Negative.    Cardiovascular: Negative.   Gastrointestinal: Negative.   Endocrine: Negative.   Genitourinary: Negative.   Musculoskeletal:  Positive for back pain, gait problem and neck pain.       Right shoulder pain Left hip pain Leg knee pain   Skin: Negative.   Allergic/Immunologic: Negative.   Hematological: Negative.   Psychiatric/Behavioral: Negative.    All other systems reviewed and are negative.      Objective:   Physical Exam  Gen: no distress, normal appearing HEENT: oral mucosa pink and moist, NCAT Cardio: Reg rate Chest: normal effort, normal rate of breathing Abd: soft, non-distended Ext: no edema Psych: pleasant, normal affect Skin: intact Neuro:  Moving all 4 to gravity and resistance Sensory exam normal for light touch and pain in all 4 limbs. No limb ataxia or cerebellar signs. No abnormal tone appreciated.   Musculoskeletal:  C spine  with fair ROM flexion and extension, and right neck rotation- improved today Mild decreased L neck rotation - improved today Tenderness Cervical muscles on R sternocleidomastoid- not present today No L spine paraspinal tenderness  C spine xray 11/18/20 2 views of the cervical spine shows significant arthritic changes of the  lower cervical spine with several levels of significant degenerative disc  disease and particular osteophytes.  There is also loss of the cervical  lordosis of the lower level.     Assessment & Plan    1) Chronic neck pain, with radicular pain into b/l scapula and upper arm.  Patient reports this is the primary cause of his pain at this  time. 2) Hx depression/anxiety on sertraline 3) Gout, on allopurinol  4) Pain in the R shoulder, R elbow, L hip, L knee pain.  Suspect this may be related to OA 5) Chronic lower back pain  Plan -Increase gabapentin to 400mg  TID, could consider increase to 600mg  TID next if not sufficient results  -Xray with spondylosis C spine, will order MRI for further evaluation and consideration possible interventional options-results pending -Consider trigger point injections R SCM if this worsens again -Continue ibuprofen -Patient reports he had limited benefit with physical therapy in the past -Continue flexeril PRN -Consider Duloxitine , would likely need to DC sertraline first  -Foods for pain list -Continue tumaric -MRI completed results pending  -He has to take off 1-2 days 2-3 times a month due to his pain

## 2023-07-01 ENCOUNTER — Other Ambulatory Visit: Payer: Self-pay | Admitting: Family Medicine

## 2023-07-01 DIAGNOSIS — F320 Major depressive disorder, single episode, mild: Secondary | ICD-10-CM

## 2023-07-04 ENCOUNTER — Other Ambulatory Visit: Payer: Self-pay | Admitting: Family Medicine

## 2023-07-04 DIAGNOSIS — M545 Low back pain, unspecified: Secondary | ICD-10-CM

## 2023-07-09 ENCOUNTER — Other Ambulatory Visit: Payer: Self-pay

## 2023-07-09 DIAGNOSIS — E1169 Type 2 diabetes mellitus with other specified complication: Secondary | ICD-10-CM

## 2023-07-09 MED ORDER — CONTOUR NEXT TEST VI STRP: ORAL_STRIP | 3 refills | Status: DC

## 2023-07-17 ENCOUNTER — Telehealth: Payer: Self-pay | Admitting: Physical Medicine & Rehabilitation

## 2023-07-17 NOTE — Telephone Encounter (Signed)
 Called to f/u on MRI C spine. Left discrete VM

## 2023-08-14 ENCOUNTER — Other Ambulatory Visit: Payer: Self-pay | Admitting: Family Medicine

## 2023-08-14 DIAGNOSIS — E1169 Type 2 diabetes mellitus with other specified complication: Secondary | ICD-10-CM

## 2023-08-14 MED ORDER — METFORMIN HCL 1000 MG PO TABS: ORAL_TABLET | ORAL | 2 refills | Status: DC

## 2023-08-14 NOTE — Telephone Encounter (Signed)
 Copied from CRM [id number]. Topic: Clinical - Medication Refill >> Aug 14, 2023  3:11 PM Deleta HERO wrote: Most Recent Primary Care Visit:  Provider: CHANDRA TORIBIO POUR  Department: [facility]  Visit Type: FOLLOW UP 20  Date: 05/21/2023  Medication: metFORMIN  (GLUCOPHAGE ) 1000 MG tablet  Has the patient contacted their pharmacy? Yes, they informed the patient that they faxed a request was sent on 02/03. (Agent: If no, request that the patient contact the pharmacy for the refill. If patient does not wish to contact the pharmacy document the reason why and proceed with request.) (Agent: If yes, when and what did the pharmacy advise?)  Is this the correct pharmacy for this prescription? Yes If no, delete pharmacy and type the correct one.  This is the patient's preferred pharmacy:  [id number] [place], [place] - [phone] AT [facility] OF [facility] & [address] [place] [address] Phone: [phone] Fax: [phone]   Has the prescription been filled recently? No  Is the patient out of the medication? Yes  Has the patient been seen for an appointment in the last year OR does the patient have an upcoming appointment? Yes  Can we respond through MyChart? No  Agent: Please be advised that Rx refills may take up to 3 business days. We ask that you follow-up with your pharmacy.

## 2023-08-20 ENCOUNTER — Encounter: Payer: Self-pay | Admitting: Physical Medicine & Rehabilitation

## 2023-08-20 ENCOUNTER — Encounter
Payer: BC Managed Care – PPO | Attending: Physical Medicine & Rehabilitation | Admitting: Physical Medicine & Rehabilitation

## 2023-08-20 VITALS — BP 121/84 | HR 92 | Ht 73.0 in | Wt 299.0 lb

## 2023-08-20 DIAGNOSIS — G894 Chronic pain syndrome: Secondary | ICD-10-CM | POA: Insufficient documentation

## 2023-08-20 DIAGNOSIS — M542 Cervicalgia: Secondary | ICD-10-CM | POA: Insufficient documentation

## 2023-08-20 DIAGNOSIS — Z79891 Long term (current) use of opiate analgesic: Secondary | ICD-10-CM | POA: Insufficient documentation

## 2023-08-20 DIAGNOSIS — Z5181 Encounter for therapeutic drug level monitoring: Secondary | ICD-10-CM | POA: Diagnosis not present

## 2023-08-20 DIAGNOSIS — G8929 Other chronic pain: Secondary | ICD-10-CM | POA: Diagnosis not present

## 2023-08-20 DIAGNOSIS — M545 Low back pain, unspecified: Secondary | ICD-10-CM | POA: Insufficient documentation

## 2023-08-20 MED ORDER — GABAPENTIN 600 MG PO TABS: 600.0000 mg | ORAL_TABLET | Freq: Three times a day (TID) | ORAL | 3 refills | Status: DC

## 2023-08-20 NOTE — Progress Notes (Addendum)
 Subjective:    Patient ID: Eddie Hancock, male    DOB: 03/04/1971, 53 y.o.   MRN: [id number]  HPI  Eddie Hancock is a 53 y.o. year old male  who  has a past medical history of Anxiety, Arthritis, Diabetes mellitus without complication (HCC), GERD (gastroesophageal reflux disease), Hyperlipidemia, Hypertension, Obesity, and Seasonal allergies.   They are presenting to PM&R clinic as a new patient for pain management evaluation. They were referred by Dr. Constance Goltz for treatment of chronic neck pain.  Patient reports his neck pain started about 3 years ago and then became severe about 2 years ago. He reports of gravel like feeling in his neck.  Patient is well his whole spine hurts has pain in his lower back however the neck is where the pain is the worst.  Sometimes the pain will shoot into his shoulders and upper arms.  He says sometimes his arms will fall asleep during the day.  He works a very physical job [place].  His back will often be sore after work.  Patient says he does not want medications that are too strong because he does not want them to affect his focus at work.  He reports he is seeing [facility] Dr. Magnus Ivan.  Patient says he was told he did not need surgery at that time.  Patient reports he also has had issues with pain in his left knee and left hip.  Pain in these areas comes and goes.  He also has elbow pain.  Furthermore he reports pain with his right shoulder due to the injury that he sustained as a child.  He has a history of gout and says that affects his ankles, knees and feet.  He reports history of chronic anxiety, on Zoloft.   Red flag symptoms: No red flags for back pain endorsed in Hx or ROS  Medications tried: Topical medications benzocaine- helps a little, icy hot- doesn thelp Nsaids Celebrex,Relafen, Ibuprofen- all help about the same. He takes ibuprofen as it is less expensive  Ibuprofen- takes 600 to 800 mg TID  Tylenol  Helps a  little, takes in the AM Opiates Denies  Gabapentin / Lyrica - denies  TCAs - Denies  SNRIs- Denies  Robaxin helps a little  Flexeril will help however he will take this after work because it can be sedating for him Turmeric helps  Other treatments: PT- helps for that day only, not long term benefit  TENs unit - helps  Injections - Denies     Interval history 06/18/2023 Eddie Hancock is here for follow-up of his chronic pain in his neck, lower back and several other joints.  Patient reports that since starting gabapentin his neck range of motion his significantly improved.  He is noticing been able to look left and right much further than he was able to previously.  Patient says he did not notice it until asked him today but he thinks pain in his joints reported previously is also decreased from prior visits.  He did develop some lower back pain about a week ago and he thinks this occurred when he was out of the gabapentin briefly.  He is not having any side effects with gabapentin.  Interval history 08/20/2023 Eddie Hancock is here for follow-up regarding his chronic pain.  Pain is the worst in his neck and lower back.  He also has pain in other peripheral joints but this is not as severe recently.  We  reviewed MRI indicating multiple levels of cervical spinal spondylosis.  Patient reports gabapentin is helping his pain a lot most days, He will occasionally have exacerbations a couple times a month when the pain is very severe.  Prior UDS results: No results found for: "LABOPIA", "COCAINSCRNUR", "LABBENZ", "AMPHETMU", "THCU", "LABBARB"   Pain Inventory Average Pain 4 Pain Right Now 4 My pain is intermittent, constant, sharp, and aching  In the last 24 hours, has pain interfered with the following? General activity 0 Relation with others 0 Enjoyment of life 6 What TIME of day is your pain at its worst? morning  and night Sleep (in general) Good  Pain is worse with: bending Pain improves with:  rest, medication, and heat Relief from Meds:4    Family History  Problem Relation Age of Onset   Hypertension Mother    Learning disabilities Maternal Grandmother    Learning disabilities Maternal Grandfather    Diabetes Daughter    Colon polyps Neg Hx    Colon cancer Neg Hx    Esophageal cancer Neg Hx    Stomach cancer Neg Hx    Rectal cancer Neg Hx    Social History   Socioeconomic History   Marital status: Married    Spouse name: Not on file   Number of children: Not on file   Years of education: Not on file   Highest education level: Not on file  Occupational History   Not on file  Tobacco Use   Smoking status: Former    Current packs/day: 0.00    Types: Cigarettes    Quit date: 11/07/2016    Years since quitting: 6.7    Passive exposure: Never   Smokeless tobacco: Never  Vaping Use   Vaping status: Never Used  Substance and Sexual Activity   Alcohol use: Yes    Comment: rare   Drug use: No   Sexual activity: Not on file  Other Topics Concern   Not on file  Social History Narrative   Not on file   Social Drivers of Health   Financial Resource Strain: Not on file  Food Insecurity: Not on file  Transportation Needs: Not on file  Physical Activity: Not on file  Stress: Not on file  Social Connections: Not on file   Past Surgical History:  Procedure Laterality Date   APPENDECTOMY     Past Medical History:  Diagnosis Date   Anxiety    situational   Arthritis    neck/spine   Diabetes mellitus without complication (HCC)    on meds   GERD (gastroesophageal reflux disease)    on meds   Hyperlipidemia    not on meds at this time (11/10/2021)   Hypertension    on meds   Obesity    Seasonal allergies    There were no vitals taken for this visit.  Opioid Risk Score:   Fall Risk Score:  `1  Depression screen [facility] 2/9     06/18/2023   11:45 AM 05/21/2023    8:15 AM 04/05/2023    2:57 PM 02/19/2023   10:30 AM 08/14/2022    3:02 PM 02/20/2022     8:09 AM 10/17/2021    8:16 AM  Depression screen PHQ 2/9  Decreased Interest 0 0 0 0 1 0 1  Down, Depressed, Hopeless 0 0 0 0 1 0 0  PHQ - 2 Score 0 0 0 0 2 0 1  Altered sleeping  0 1 0 1 1 0  Tired, decreased energy  2 1 0 1 1 1   Change in appetite  0 0 0 1 0 0  Feeling bad or failure about yourself   0 0 0 1 1 0  Trouble concentrating  0 0 0 1 1 0  Moving slowly or fidgety/restless  0 0 0 0 0 0  Suicidal thoughts  0 0 0 0 0 0  PHQ-9 Score  2 2 0 7 4 2   Difficult doing work/chores  Somewhat difficult Not difficult at all Not difficult at all Not difficult at all Somewhat difficult Somewhat difficult      Review of Systems  Constitutional: Negative.   HENT: Negative.    Respiratory: Negative.    Cardiovascular: Negative.   Gastrointestinal: Negative.   Endocrine: Negative.   Genitourinary: Negative.   Musculoskeletal:  Positive for back pain and neck pain. Negative for gait problem.       Right shoulder pain Left hip pain Leg knee pain   Skin: Negative.   Allergic/Immunologic: Negative.   Hematological: Negative.   Psychiatric/Behavioral: Negative.    All other systems reviewed and are negative.      Objective:   Physical Exam  Gen: no distress, normal appearing HEENT: oral mucosa pink and moist, NCAT Chest: normal effort, normal rate of breathing Abd: soft, non-distended Ext: no edema Psych: pleasant, normal affect Skin: No skin breakdown noted in visible portion Neuro:  Moving all 4 to gravity and resistance Sensory exam normal for light touch and pain in all 4 limbs. No limb ataxia or cerebellar signs. No abnormal tone appreciated.   Musculoskeletal:  C-spine with fair ROM Spurling's test negative Minimal C-spine TTP today No significant L-spine TTP today Slump test negative today  C spine xray 11/18/20 2 views of the cervical spine shows significant arthritic changes of the  lower cervical spine with several levels of significant degenerative disc   disease and particular osteophytes.  There is also loss of the cervical  lordosis of the lower level.   MRI L spine 06/08/24  FINDINGS: Alignment: No static listhesis. Loss of the normal cervical lordosis with straightening.   Vertebrae: No acute fracture, evidence of discitis, or aggressive bone lesion.   Cord: Normal signal and morphology.   Posterior Fossa, vertebral arteries, paraspinal tissues: Posterior fossa demonstrates no focal abnormality. Vertebral artery flow voids are maintained. Paraspinal soft tissues are unremarkable.   Disc levels:   Discs: Severe disc height loss at C6-7 and C7-T1. Mild disc height loss at T1-2.   C2-3: No significant disc bulge. No neural foraminal stenosis. No central canal stenosis.   C3-4: No disc protrusion. Mild bilateral foraminal narrowing. No central canal stenosis.   C4-5: No disc protrusion. Moderate bilateral foraminal stenosis. No central canal stenosis.   C5-6: Mild disc bulge. Severe left facet arthropathy. Bilateral uncovertebral degenerative changes. Moderate right foraminal stenosis. Severe left foraminal stenosis.   C6-7: No disc protrusion. Bilateral uncovertebral degenerative changes. Severe bilateral foraminal stenosis. No central canal stenosis.   C7-T1: Mild disc bulge. Bilateral uncovertebral degenerative changes. Severe bilateral foraminal stenosis. No central canal stenosis.   T1-2: Broad disc bulge. Bilateral uncovertebral degenerative changes. Moderate bilateral foraminal narrowing.   IMPRESSION: 1. Diffuse cervical spine spondylosis as described above. 2. No acute osseous injury of the cervical spine.      Assessment & Plan    1) Chronic neck pain, with radicular pain into b/l scapula and upper arm.  Patient reports this is the primary cause of his  pain at this time.  -C-spine MRI with multilevel spondylosis, multilevel bilateral foraminal stenosis, C5-6 severe left facet arthropathy 2) Hx  depression/anxiety on sertraline 3) Gout, on allopurinol  4) Pain in the R shoulder, R elbow, L hip, L knee pain.  Suspect this may be related to OA 5) Chronic lower back pain  Plan -Increase gabapentin to 600 mg 3 times daily -Consider trigger point injections R SCM if this worsens again -Continue ibuprofen -Patient reports he had limited benefit with physical therapy in the past -Continue flexeril PRN -Consider Duloxitine , would likely need to DC sertraline first  -Foods for pain list -Continue tumaric -UDS today, pain agreement-consider starting tramadol 50 mg twice daily as needed for use during acute pain exacerbations pending results -Consider interventional options later visit  UDS negative- tramadol ordered #60 as above

## 2023-08-21 LAB — UNLABELED: Test Ordered On Req: 9397593976

## 2023-08-22 ENCOUNTER — Other Ambulatory Visit: Payer: Self-pay

## 2023-08-22 DIAGNOSIS — G894 Chronic pain syndrome: Secondary | ICD-10-CM

## 2023-08-22 DIAGNOSIS — Z79891 Long term (current) use of opiate analgesic: Secondary | ICD-10-CM

## 2023-08-22 DIAGNOSIS — Z5181 Encounter for therapeutic drug level monitoring: Secondary | ICD-10-CM

## 2023-08-27 ENCOUNTER — Ambulatory Visit (INDEPENDENT_AMBULATORY_CARE_PROVIDER_SITE_OTHER): Payer: BC Managed Care – PPO | Admitting: Family Medicine

## 2023-08-27 ENCOUNTER — Encounter: Payer: Self-pay | Admitting: Family Medicine

## 2023-08-27 VITALS — BP 119/84 | HR 83 | Ht 73.0 in | Wt 303.0 lb

## 2023-08-27 DIAGNOSIS — E1169 Type 2 diabetes mellitus with other specified complication: Secondary | ICD-10-CM | POA: Diagnosis not present

## 2023-08-27 DIAGNOSIS — E118 Type 2 diabetes mellitus with unspecified complications: Secondary | ICD-10-CM | POA: Diagnosis not present

## 2023-08-27 DIAGNOSIS — Z7984 Long term (current) use of oral hypoglycemic drugs: Secondary | ICD-10-CM

## 2023-08-27 DIAGNOSIS — E782 Mixed hyperlipidemia: Secondary | ICD-10-CM | POA: Diagnosis not present

## 2023-08-27 DIAGNOSIS — Z23 Encounter for immunization: Secondary | ICD-10-CM

## 2023-08-27 LAB — POCT GLYCOSYLATED HEMOGLOBIN (HGB A1C): HbA1c POC (<> result, manual entry): 6.8 % (ref 4.0–5.6)

## 2023-08-27 MED ORDER — KETOCONAZOLE 2 % EX CREA: 1.0000 | TOPICAL_CREAM | Freq: Every day | CUTANEOUS | 2 refills | Status: AC

## 2023-08-27 NOTE — Assessment & Plan Note (Signed)
Patient is taking his Zetia and statin.  Is agreeable to getting lipid panel today.  Continue current medication.

## 2023-08-27 NOTE — Patient Instructions (Signed)
It was nice to see you today,  We addressed the following topics today: -I have sent in the ketoconazole cream with some refills. - We will let you know the results of your cholesterol test when we get it - Continue your current medications.  Have a great day,  Frederic Jericho, MD

## 2023-08-27 NOTE — Progress Notes (Signed)
   Established Patient Office Visit  Subjective   Patient ID: Eddie Hancock, male    DOB: 1971/06/28  Age: 53 y.o. MRN: [id number]  Chief Complaint  Patient presents with   Medical Management of Chronic Issues    HPI  DM 2-patient states that he has been doing well with his blood sugar since last time I saw him.  We discussed his A1c that was improved to 6.8 today.  He is taking his Jardiance and metformin as directed.  Does not see an optometrist, knows he should schedule an appointment with one.  Hyperlipidemia-patient is taking his rosuvastatin and Zetia.  No questions or concerns regarding this medication.  Patient recently increased his gabapentin to 600 3 times daily from 400 3 times daily.  We discussed the shingles vaccination.  Patient agreeable to having this done today.  The ASCVD Risk score (Arnett DK, et al., 2019) failed to calculate for the following reasons:   The valid total cholesterol range is 130 to 320 mg/dL  Health Maintenance Due  Topic Date Due   HIV Screening  Never done   Hepatitis C Screening  Never done   Pneumococcal Vaccine 77-15 Years old (2 of 2 - PCV) 06/21/2018   OPHTHALMOLOGY EXAM  09/03/2018   Zoster Vaccines- Shingrix (1 of 2) Never done   COVID-19 Vaccine (4 - 2024-25 season) 03/11/2023      Objective:     BP 119/84   Pulse 83   Ht 6\' 1"  (1.854 m)   Wt (!) 303 lb (137.4 kg)   SpO2 99%   BMI 39.98 kg/m    Physical Exam General: Alert, oriented CV: Regular rate and rhythm Pulmonary: Lungs clear bilaterally Psych: Pleasant affect   No results found for any visits on 08/27/23.      Assessment & Plan:   Controlled type 2 diabetes mellitus with complication, without long-term current use of insulin (HCC) Assessment & Plan: Improved, 6.8 today.  Follow-up in 3 months.  Continue Jardiance and metformin max dose.  Orders: -     POCT glycosylated hemoglobin (Hb A1C)  Mixed diabetic hyperlipidemia associated with type 2  diabetes mellitus (HCC) Assessment & Plan: Patient is taking his Zetia and statin.  Is agreeable to getting lipid panel today.  Continue current medication.  Orders: -     Lipid panel  Other orders -     Ketoconazole; Apply 1 Application topically daily. Apply to rash daily.  Dispense: 60 g; Refill: 2     Return in about 3 months (around 11/24/2023) for DM.    Sandre Kitty, MD

## 2023-08-27 NOTE — Assessment & Plan Note (Signed)
Improved, 6.8 today.  Follow-up in 3 months.  Continue Jardiance and metformin max dose.

## 2023-08-28 ENCOUNTER — Encounter: Payer: Self-pay | Admitting: Family Medicine

## 2023-08-28 LAB — LIPID PANEL
Chol/HDL Ratio: 3.5 {ratio} (ref 0.0–5.0)
Cholesterol, Total: 131 mg/dL (ref 100–199)
HDL: 37 mg/dL — ABNORMAL LOW (ref >39)
LDL Chol Calc (NIH): 45 mg/dL (ref 0–99)
Triglycerides: 321 mg/dL — ABNORMAL HIGH (ref 0–149)
VLDL Cholesterol Cal: 49 mg/dL — ABNORMAL HIGH (ref 5–40)

## 2023-08-29 LAB — DRUG TOX MONITOR 1 W/CONF, ORAL FLD

## 2023-08-29 LAB — PAT ID TIQ DOC

## 2023-08-29 LAB — DRUG TOX ALC METAB W/CON, ORAL FLD: Alcohol Metabolite: NEGATIVE ng/mL (ref <25)

## 2023-08-30 MED ORDER — TRAMADOL HCL 50 MG PO TABS: 50.0000 mg | ORAL_TABLET | Freq: Two times a day (BID) | ORAL | 0 refills | Status: DC | PRN

## 2023-08-30 NOTE — Addendum Note (Signed)
Addended by: Fanny Dance on: 08/30/2023 03:56 PM   Modules accepted: Orders

## 2023-09-02 ENCOUNTER — Other Ambulatory Visit: Payer: Self-pay | Admitting: Family Medicine

## 2023-09-02 DIAGNOSIS — M1A9XX0 Chronic gout, unspecified, without tophus (tophi): Secondary | ICD-10-CM

## 2023-09-02 DIAGNOSIS — I152 Hypertension secondary to endocrine disorders: Secondary | ICD-10-CM

## 2023-09-03 ENCOUNTER — Telehealth: Payer: Self-pay

## 2023-09-03 NOTE — Telephone Encounter (Signed)
 PA FOR Tramadol 50 mg submitted in Cover My Meds.

## 2023-09-05 NOTE — Telephone Encounter (Signed)
 Outcome N/A on February 24 by [facility] 2017 No Authorization Required.No Authorization Required.Verified paid test claim on 09/03/2023 for a 7-day supply/quantity. Please contact the pharmacy to have them process accordingly and perform an override. Thank you  I spoke with pharmacy and they say they have the Rx for him, I have let him know and if we need to send new Rx to let us know.

## 2023-10-01 ENCOUNTER — Encounter: Payer: BC Managed Care – PPO | Admitting: Physical Medicine & Rehabilitation

## 2023-10-02 ENCOUNTER — Other Ambulatory Visit: Payer: Self-pay | Admitting: Family Medicine

## 2023-10-02 DIAGNOSIS — M545 Low back pain, unspecified: Secondary | ICD-10-CM

## 2023-10-03 ENCOUNTER — Other Ambulatory Visit: Payer: Self-pay | Admitting: Family Medicine

## 2023-10-03 DIAGNOSIS — F320 Major depressive disorder, single episode, mild: Secondary | ICD-10-CM

## 2023-10-21 ENCOUNTER — Ambulatory Visit
Admission: EM | Admit: 2023-10-21 | Discharge: 2023-10-21 | Disposition: A | Discharge status: Home / Self Care | Attending: Physician Assistant | Admitting: Physician Assistant

## 2023-10-21 DIAGNOSIS — J209 Acute bronchitis, unspecified: Secondary | ICD-10-CM

## 2023-10-21 DIAGNOSIS — E118 Type 2 diabetes mellitus with unspecified complications: Secondary | ICD-10-CM

## 2023-10-21 MED ORDER — PREDNISONE 20 MG PO TABS: 40.0000 mg | ORAL_TABLET | Freq: Every day | ORAL | 0 refills | Status: AC

## 2023-10-21 NOTE — ED Triage Notes (Signed)
"  This started with a Cough Wednesday afternoon, progressed into Thursday with sore throat and congestion, now all continues with a more productive cough now". No fever.

## 2023-10-21 NOTE — ED Provider Notes (Signed)
 [facility]    CSN: [id number] Arrival date & time: 10/21/23  0803      History   Chief Complaint Chief Complaint  Patient presents with   Cough   Sore Throat   Nasal Congestion    HPI Eddie Hancock is a 53 y.o. male.   Patient presents today for 5-day history of congestion, sore throat and cough.  He reports his ears feel full today.  He notes cough has become somewhat more productive.  He reports some chest tightness with cough as well.  He denies any fever.  He does have known allergies at baseline and states he was taking an over-the-counter medication but this was not helpful so he discontinued.  The history is provided by the patient.    Past Medical History:  Diagnosis Date   Anxiety    situational   Arthritis    neck/spine   Diabetes mellitus without complication (HCC)    on meds   GERD (gastroesophageal reflux disease)    on meds   Hyperlipidemia    not on meds at this time (11/10/2021)   Hypertension    on meds   Obesity    Seasonal allergies     Patient Active Problem List   Diagnosis Date Noted   Tinea versicolor 02/19/2023   Chronic neck pain 02/19/2023   Folliculitis 09/10/2022   Depression, major, single episode, mild (HCC) 10/17/2021   BMI 40.0-44.9, adult (HCC) 07/15/2018   Morbid obesity (HCC) 07/15/2018   Insomnia 11/29/2017   Diabetes mellitus type II, controlled (HCC) 06/21/2017   Healthcare maintenance 06/21/2017   Mixed diabetic hyperlipidemia associated with type 2 diabetes mellitus (HCC) 06/21/2017   Chronic low back pain without sciatica 11/04/2015   Hyperglycemia 11/04/2015   Hypertriglyceridemia 11/04/2015   Seasonal allergic rhinitis due to pollen 11/04/2015   Hypertension associated with diabetes (HCC) 11/04/2015   Benign essential hypertension 11/04/2015    Past Surgical History:  Procedure Laterality Date   APPENDECTOMY         Home Medications    Prior to Admission medications   Medication Sig  Start Date End Date Taking? Authorizing Provider  predniSONE (DELTASONE) 20 MG tablet Take 2 tablets (40 mg total) by mouth daily with breakfast for 5 days. 10/21/23 10/26/23 Yes Vernestine Gondola, PA-C  allopurinol (ZYLOPRIM) 300 MG tablet TAKE 1 TABLET BY MOUTH EVERY DAY 09/03/23   Laneta Pintos, MD  Blood Glucose Monitoring Suppl (CONTOUR NEXT ONE) KIT 1 each by Does not apply route 2 (two) times daily. Check fasting blood sugar in morning and then recheck blood sugar after largest meal. 10/02/19   Opalski, Bernardo Bridgeman, DO  clobetasol cream (TEMOVATE) 0.05 % Apply 1 Application topically 2 (two) times daily. 08/14/22   Boscia, Heather E, NP  cyclobenzaprine (FLEXERIL) 10 MG tablet TAKE 1 TABLET(10 MG) BY MOUTH AT BEDTIME AS NEEDED FOR MUSCLE SPASMS 10/02/23   Laneta Pintos, MD  ezetimibe (ZETIA) 10 MG tablet Take 1 tablet (10 mg total) by mouth daily. 05/21/23   Olson, Daniel K, MD  fluticasone (FLONASE) 50 MCG/ACT nasal spray Place 1 spray into both nostrils daily. 02/13/23   Dodson Freestone, FNP  gabapentin (NEURONTIN) 600 MG tablet Take 1 tablet (600 mg total) by mouth 3 (three) times daily. 08/20/23   Lylia Sand, MD  Ginseng 100 MG CAPS Take by mouth.    [provider]  glucose blood (CONTOUR NEXT TEST) test strip Use as instructed 07/09/23   Arabella Beach,  Daniel K, MD  ibuprofen (ADVIL) 800 MG tablet TAKE 1 TABLET(800 MG) BY MOUTH TWICE DAILY AS NEEDED FOR MODERATE PAIN 08/30/21   Abonza, Maritza, PA-C  Insulin Pen Needle (PEN NEEDLES 31GX5/16") 31G X 8 MM MISC 1 each by Does not apply route daily. Use one needle daily with Victoza injection 06/10/19   Danford, Acie Holiday D, NP  JARDIANCE 25 MG TABS tablet TAKE 1 TABLET(25 MG) BY MOUTH DAILY 09/03/23   Olson, Daniel K, MD  ketoconazole (NIZORAL) 2 % cream Apply 1 Application topically daily. Apply to rash daily. 08/27/23   Laneta Pintos, MD  lisinopril (ZESTRIL) 20 MG tablet TAKE 1 TABLET(20 MG) BY MOUTH DAILY 09/03/23   Laneta Pintos, MD  metFORMIN  (GLUCOPHAGE) 1000 MG tablet TAKE 1 TABLET(1000 MG) BY MOUTH TWICE DAILY WITH A MEAL 08/14/23   Laneta Pintos, MD  Multiple Vitamin (MULTIVITAMIN) tablet Take 1 tablet by mouth daily.    [provider]  Probiotic Product (PROBIOTIC DAILY PO) Take 1 tablet by mouth daily.    [provider]  rosuvastatin (CRESTOR) 5 MG tablet Take 1 tablet (5 mg total) by mouth daily. 05/21/23   Laneta Pintos, MD  sertraline (ZOLOFT) 100 MG tablet TAKE 1 TABLET(100 MG) BY MOUTH DAILY 10/03/23   Laneta Pintos, MD  traMADol (ULTRAM) 50 MG tablet Take 1 tablet (50 mg total) by mouth every 12 (twelve) hours as needed. 08/30/23   Lylia Sand, MD  Turmeric (QC TUMERIC COMPLEX PO) Take by mouth.    [provider]  liraglutide (VICTOZA) 18 MG/3ML SOPN Inject 0.1 mLs (0.6 mg total) into the skin daily for 7 days, THEN 0.2 mLs (1.2 mg total) daily for 7 days, THEN 0.2 mLs (1.2 mg total) daily for 7 days. 04/29/19 07/18/19  Napoleon Backer D, NP    Family History Family History  Problem Relation Age of Onset   Hypertension Mother    Learning disabilities Maternal Grandmother    Learning disabilities Maternal Grandfather    Diabetes Daughter    Colon polyps Neg Hx    Colon cancer Neg Hx    Esophageal cancer Neg Hx    Stomach cancer Neg Hx    Rectal cancer Neg Hx     Social History Social History   Tobacco Use   Smoking status: Former    Current packs/day: 0.00    Types: Cigarettes    Quit date: 11/07/2016    Years since quitting: 6.9    Passive exposure: Never   Smokeless tobacco: Never  Vaping Use   Vaping status: Never Used  Substance Use Topics   Alcohol use: Yes    Comment: rare   Drug use: No     Allergies   Cetirizine, Liraglutide, and Onglyza [saxagliptin]   Review of Systems Review of Systems  Constitutional:  Negative for chills and fever.  HENT:  Positive for congestion and sore throat. Negative for ear pain.   Eyes:  Negative for discharge and redness.   Respiratory:  Positive for cough. Negative for shortness of breath.   Gastrointestinal:  Negative for abdominal pain, nausea and vomiting.     Physical Exam Triage Vital Signs ED Triage Vitals  Encounter Vitals Group     BP      Systolic BP Percentile      Diastolic BP Percentile      Pulse      Resp      Temp      Temp src  SpO2      Weight      Height      Head Circumference      Peak Flow      Pain Score      Pain Loc      Pain Education      Exclude from Growth Chart    No data found.  Updated Vital Signs BP 121/78 (BP Location: Right Arm)   Pulse 84   Temp 98.1 F (36.7 C) (Oral)   Resp 18   Ht 6\' 1"  (1.854 m)   Wt 300 lb (136.1 kg)   SpO2 96%   BMI 39.58 kg/m   Visual Acuity Right Eye Distance:   Left Eye Distance:   Bilateral Distance:    Right Eye Near:   Left Eye Near:    Bilateral Near:     Physical Exam Vitals and nursing note reviewed.  Constitutional:      General: He is not in acute distress.    Appearance: Normal appearance. He is not ill-appearing.  HENT:     Head: Normocephalic and atraumatic.     Right Ear: Tympanic membrane normal.     Left Ear: Tympanic membrane normal.     Nose: Congestion present.     Mouth/Throat:     Mouth: Mucous membranes are moist.     Pharynx: Oropharynx is clear. No oropharyngeal exudate or posterior oropharyngeal erythema.  Eyes:     Conjunctiva/sclera: Conjunctivae normal.  Cardiovascular:     Rate and Rhythm: Normal rate and regular rhythm.     Heart sounds: Normal heart sounds. No murmur heard. Pulmonary:     Effort: Pulmonary effort is normal. No respiratory distress.     Breath sounds: Normal breath sounds. No wheezing, rhonchi or rales.  Skin:    General: Skin is warm and dry.  Neurological:     Mental Status: He is alert.  Psychiatric:        Mood and Affect: Mood normal.        Thought Content: Thought content normal.      UC Treatments / Results  Labs (all labs ordered are  listed, but only abnormal results are displayed) Labs Reviewed - No data to display  EKG   Radiology No results found.  Procedures Procedures (including critical care time)  Medications Ordered in UC Medications - No data to display  Initial Impression / Assessment and Plan / UC Course  I have reviewed the triage vital signs and the nursing notes.  Pertinent labs & imaging results that were available during my care of the patient were reviewed by me and considered in my medical decision making (see chart for details).    Suspect most likely bronchitis and recommended steroid burst for treatment.  Patient is a type II diabetic but A1c reviewed and is well-controlled.  Advised that he monitor his glucose levels while taking steroid.  Encouraged follow-up with any further concerns or worsening symptoms.  Patient expressed understanding.  Final Clinical Impressions(s) / UC Diagnoses   Final diagnoses:  Acute bronchitis, unspecified organism  Controlled type 2 diabetes mellitus with complication, without long-term current use of insulin [facility])   Discharge Instructions   None    ED Prescriptions     Medication Sig Dispense Auth. Provider   predniSONE (DELTASONE) 20 MG tablet Take 2 tablets (40 mg total) by mouth daily with breakfast for 5 days. 10 tablet Vernestine Gondola, PA-C      PDMP not reviewed this  encounter.   Vernestine Gondola, PA-C 10/21/23 0900

## 2023-10-21 NOTE — ED Triage Notes (Signed)
"  I did an at home COVID19 test and it was Negative".

## 2023-10-22 ENCOUNTER — Encounter: Admitting: Physical Medicine & Rehabilitation

## 2023-10-29 ENCOUNTER — Other Ambulatory Visit: Payer: Self-pay | Admitting: Family Medicine

## 2023-10-29 DIAGNOSIS — E1169 Type 2 diabetes mellitus with other specified complication: Secondary | ICD-10-CM

## 2023-11-26 ENCOUNTER — Ambulatory Visit: Payer: BC Managed Care – PPO | Admitting: Family Medicine

## 2023-11-26 ENCOUNTER — Encounter: Payer: Self-pay | Admitting: Family Medicine

## 2023-11-26 VITALS — BP 135/82 | HR 82 | Ht 73.0 in | Wt 303.4 lb

## 2023-11-26 DIAGNOSIS — E782 Mixed hyperlipidemia: Secondary | ICD-10-CM | POA: Diagnosis not present

## 2023-11-26 DIAGNOSIS — Z23 Encounter for immunization: Secondary | ICD-10-CM | POA: Diagnosis not present

## 2023-11-26 DIAGNOSIS — E118 Type 2 diabetes mellitus with unspecified complications: Secondary | ICD-10-CM | POA: Diagnosis not present

## 2023-11-26 DIAGNOSIS — E1169 Type 2 diabetes mellitus with other specified complication: Secondary | ICD-10-CM | POA: Diagnosis not present

## 2023-11-26 DIAGNOSIS — R5383 Other fatigue: Secondary | ICD-10-CM | POA: Diagnosis not present

## 2023-11-26 DIAGNOSIS — L600 Ingrowing nail: Secondary | ICD-10-CM | POA: Insufficient documentation

## 2023-11-26 DIAGNOSIS — Z7985 Long-term (current) use of injectable non-insulin antidiabetic drugs: Secondary | ICD-10-CM

## 2023-11-26 LAB — POCT GLYCOSYLATED HEMOGLOBIN (HGB A1C): HbA1c POC (<> result, manual entry): 7.7 % (ref 4.0–5.6)

## 2023-11-26 MED ORDER — TIRZEPATIDE 2.5 MG/0.5ML ~~LOC~~ SOAJ: 2.5000 mg | SUBCUTANEOUS | 0 refills | Status: DC

## 2023-11-26 NOTE — Assessment & Plan Note (Signed)
 Sending in referral to podiatry. No evidence of infection at this time.

## 2023-11-26 NOTE — Progress Notes (Signed)
 Established Patient Office Visit  Subjective   Patient ID: Eddie Hancock, male    DOB: [birth year]  Age: 53 y.o. MRN: [id number]  Chief Complaint  Patient presents with   Medical Management of Chronic Issues    HPI  Subjective - Elevated A1C due to steroid treatment for bronchitis - Reports blood glucose levels were "in the 400s" for a week during steroid treatment - Completed 10-day keto cycle to help lower blood sugars - Reports feeling better now - Bronchitis has resolved - Complains of fatigue, lack of energy, difficulty exercising after work - Interested in testosterone testing due to family history (brother on testosterone replacement with good results) - Inquiring about weight loss medications - discussed mounjaro.  Previously took victoza  for 1 week, had abd pain and stopped taking it.  States it was uncertain if he had pancreatitis.   - Reports history of recurrent ingrown toenails since childhood.  - usually digs out the corners of the nails on his own. Is open to seeing a podiatrist.   Medications: rosuvastatin  (Crestor ), ezetimibe  (Zetia ), metformin , Jardiance   PMH, PSH, FH, Social Hx: Type 2 diabetes, hyperlipidemia with elevated triglycerides (300 three months ago), has daughter with Type 1 diabetes  ROS: Denies vision changes. Reports fatigue.  The 10-year ASCVD risk score (Arnett DK, et al., 2019) is: 8%  Health Maintenance Due  Topic Date Due   HIV Screening  Never done   Hepatitis C Screening  Never done   Pneumococcal Vaccine 72-20 Years old (2 of 2 - PCV) 06/21/2018   OPHTHALMOLOGY EXAM  09/03/2018   COVID-19 Vaccine (4 - 2024-25 season) 03/11/2023      Objective:     BP 135/82   Pulse 82   Ht 6\' 1"  (1.854 m)   Wt (!) 303 lb 6.4 oz (137.6 kg)   SpO2 99%   BMI 40.03 kg/m    Physical Exam Gen: alert, oriented Cv: rrr Pulm: lctab Ext: ingrown nail of great toe b/l.  Right toe= medial and lateral neail .   Results for orders placed or  performed in visit on 11/26/23  POCT glycosylated hemoglobin (Hb A1C)  Result Value Ref Range   Hemoglobin A1C     HbA1c POC (<> result, manual entry) 7.7 4.0 - 5.6 %   HbA1c, POC (prediabetic range)     HbA1c, POC (controlled diabetic range)          Assessment & Plan:   Controlled type 2 diabetes mellitus with complication, without long-term current use of insulin (HCC) Assessment & Plan: On max dose metformin  and jardiance .  Recently took steroids which likely explained the incrased A1c.  Pt agreeable to trying mounjaro.  Did not tolerate victoza  in the past but only took it one week.  Uncertain if he had pancreatitis, had RUQ u/s and his lipase was only slightly elevated ~100,  - sending in mounjaro rx.   - foot exam normal except for ingrown nail.  Will send in referral to podiatry - uacr today  Orders: -     POCT glycosylated hemoglobin (Hb A1C) -     Microalbumin / creatinine urine ratio -     Ambulatory referral to Podiatry -     Tirzepatide; Inject 2.5 mg into the skin once a week.  Dispense: 2 mL; Refill: 0  Mixed diabetic hyperlipidemia associated with type 2 diabetes mellitus (HCC) -     POCT glycosylated hemoglobin (Hb A1C)  Other fatigue -     Testosterone;  Future  Need for shingles vaccine -     Varicella-zoster vaccine IM  Ingrown toenail of both feet Assessment & Plan: Sending in referral to podiatry. No evidence of infection at this time.   Orders: -     Ambulatory referral to Podiatry     Return in about 3 months (around 02/26/2024) for DM.    Laneta Pintos, MD

## 2023-11-26 NOTE — Assessment & Plan Note (Signed)
 On max dose metformin  and jardiance .  Recently took steroids which likely explained the incrased A1c.  Pt agreeable to trying mounjaro.  Did not tolerate victoza  in the past but only took it one week.  Uncertain if he had pancreatitis, had RUQ u/s and his lipase was only slightly elevated ~100,  - sending in mounjaro rx.   - foot exam normal except for ingrown nail.  Will send in referral to podiatry - uacr today

## 2023-11-26 NOTE — Patient Instructions (Signed)
 It was nice to see you today,  We addressed the following topics today: -I am sending in a prescription for the Mounjaro.  You will take this once a week. - I am sending in an order for testosterone labs.  You can schedule this at your earliest convenience as long as the appointment is before 10 AM. - I am sending in a referral to podiatry.  Someone should call you to schedule this. - Is also important to get your vision checked for diabetic retinopathy.  Have a great day,  Etha Henle, MD

## 2023-11-27 ENCOUNTER — Ambulatory Visit: Payer: Self-pay | Admitting: Family Medicine

## 2023-11-27 DIAGNOSIS — R7989 Other specified abnormal findings of blood chemistry: Secondary | ICD-10-CM

## 2023-11-27 LAB — MICROALBUMIN / CREATININE URINE RATIO
Creatinine, Urine: 65.1 mg/dL
Microalb/Creat Ratio: <5 mg/g{creat} (ref 0–29)
Microalbumin, Urine: <3 ug/mL

## 2023-12-01 ENCOUNTER — Other Ambulatory Visit: Payer: Self-pay | Admitting: Family Medicine

## 2023-12-01 DIAGNOSIS — E1159 Type 2 diabetes mellitus with other circulatory complications: Secondary | ICD-10-CM

## 2023-12-01 DIAGNOSIS — M1A9XX0 Chronic gout, unspecified, without tophus (tophi): Secondary | ICD-10-CM

## 2023-12-07 ENCOUNTER — Ambulatory Visit
Admission: RE | Admit: 2023-12-07 | Discharge: 2023-12-07 | Disposition: A | Discharge status: Home / Self Care | Source: Ambulatory Visit | Attending: Physical Medicine & Rehabilitation | Admitting: Physical Medicine & Rehabilitation

## 2023-12-07 ENCOUNTER — Encounter: Payer: Self-pay | Admitting: Physical Medicine & Rehabilitation

## 2023-12-07 ENCOUNTER — Encounter: Attending: Physical Medicine & Rehabilitation | Admitting: Physical Medicine & Rehabilitation

## 2023-12-07 ENCOUNTER — Other Ambulatory Visit: Payer: Self-pay

## 2023-12-07 VITALS — BP 123/81 | HR 86 | Ht 73.0 in | Wt 302.0 lb

## 2023-12-07 DIAGNOSIS — Z79891 Long term (current) use of opiate analgesic: Secondary | ICD-10-CM | POA: Diagnosis not present

## 2023-12-07 DIAGNOSIS — G894 Chronic pain syndrome: Secondary | ICD-10-CM | POA: Insufficient documentation

## 2023-12-07 DIAGNOSIS — M542 Cervicalgia: Secondary | ICD-10-CM | POA: Diagnosis not present

## 2023-12-07 DIAGNOSIS — G8929 Other chronic pain: Secondary | ICD-10-CM | POA: Diagnosis not present

## 2023-12-07 DIAGNOSIS — M545 Low back pain, unspecified: Secondary | ICD-10-CM | POA: Diagnosis not present

## 2023-12-07 DIAGNOSIS — Z5181 Encounter for therapeutic drug level monitoring: Secondary | ICD-10-CM | POA: Diagnosis not present

## 2023-12-07 NOTE — Progress Notes (Signed)
 Subjective:    Patient ID: Eddie Hancock, male    DOB: 04-13-1971, 53 y.o.   MRN: [id number]  HPI  Eddie Hancock is a 53 y.o. year old male  who  has a past medical history of Anxiety, Arthritis, Diabetes mellitus without complication (HCC), GERD (gastroesophageal reflux disease), Hyperlipidemia, Hypertension, Obesity, and Seasonal allergies.   They are presenting to PM&R clinic as a new patient for pain management evaluation. They were referred by Dr. Arabella Beach for treatment of chronic neck pain.  Patient reports his neck pain started about 3 years ago and then became severe about 2 years ago. He reports of gravel like feeling in his neck.  Patient is well his whole spine hurts has pain in his lower back however the neck is where the pain is the worst.  Sometimes the pain will shoot into his shoulders and upper arms.  He says sometimes his arms will fall asleep during the day.  He works a very physical job [place].  His back will often be sore after work.  Patient says he does not want medications that are too strong because he does not want them to affect his focus at work.  He reports he is seeing [facility] Dr. Lucienne Ryder.  Patient says he was told he did not need surgery at that time.  Patient reports he also has had issues with pain in his left knee and left hip.  Pain in these areas comes and goes.  He also has elbow pain.  Furthermore he reports pain with his right shoulder due to the injury that he sustained as a child.  He has a history of gout and says that affects his ankles, knees and feet.  He reports history of chronic anxiety, on Zoloft .   Red flag symptoms: No red flags for back pain endorsed in Hx or ROS  Medications tried: Topical medications benzocaine- helps a little, icy hot- doesn thelp Nsaids Celebrex ,Relafen , Ibuprofen - all help about the same. He takes ibuprofen  as it is less expensive  Ibuprofen - takes 600 to 800 mg TID  Tylenol  Helps a  little, takes in the AM Opiates Denies  Gabapentin  / Lyrica - denies  TCAs - Denies  SNRIs- Denies  Robaxin  helps a little  Flexeril  will help however he will take this after work because it can be sedating for him Turmeric helps  Other treatments: PT- helps for that day only, not long term benefit  TENs unit - helps  Injections - Denies     Interval history 06/18/2023 [name] is here for follow-up of his chronic pain in his neck, lower back and several other joints.  Patient reports that since starting gabapentin  his neck range of motion his significantly improved.  He is noticing been able to look left and right much further than he was able to previously.  Patient says he did not notice it until asked him today but he thinks pain in his joints reported previously is also decreased from prior visits.  He did develop some lower back pain about a week ago and he thinks this occurred when he was out of the gabapentin  briefly.  He is not having any side effects with gabapentin .  Interval history 08/20/2023 [name] is here for follow-up regarding his chronic pain.  Pain is the worst in his neck and lower back.  He also has pain in other peripheral joints but this is not as severe recently.  We  reviewed MRI indicating multiple levels of cervical spinal spondylosis.  Patient reports gabapentin  is helping his pain a lot most days, He will occasionally have exacerbations a couple times a month when the pain is very severe.  Interval history 12/07/23 [name] reports his neck pain and other joint pain is doing much better since he started gabapentin .  Currently also his pain is in his lower back.  He has had back pain for many years and has been gradually worsening.  It started when he pulled his back at work many years ago.  Pain is primarily axial and is not having significant issues with sciatica.  Sometimes laying on a hard surface will help his low back.  Tramadol  takes the edge off his pain, he  has been using it sparingly only when the pain is very severe.  No side effects with the medication.  Prior UDS results: No results found for: "LABOPIA", "COCAINSCRNUR", "LABBENZ", "AMPHETMU", "THCU", "LABBARB"   Pain Inventory Average Pain 3 Pain Right Now 3 My pain is intermittent, constant, and aching  In the last 24 hours, has pain interfered with the following? General activity 3 Relation with others 0 Enjoyment of life 3 What TIME of day is your pain at its worst? morning  Sleep (in general) Good  Pain is worse with: bending, sitting, standing, and some activites Pain improves with: medication and heat Relief from Meds: 5    Family History  Problem Relation Age of Onset   Hypertension Mother    Learning disabilities Maternal Grandmother    Learning disabilities Maternal Grandfather    Diabetes Daughter    Colon polyps Neg Hx    Colon cancer Neg Hx    Esophageal cancer Neg Hx    Stomach cancer Neg Hx    Rectal cancer Neg Hx    Social History   Socioeconomic History   Marital status: Married    Spouse name: Not on file   Number of children: Not on file   Years of education: Not on file   Highest education level: Not on file  Occupational History   Not on file  Tobacco Use   Smoking status: Former    Current packs/day: 0.00    Types: Cigarettes    Quit date: 11/07/2016    Years since quitting: 7.0    Passive exposure: Never   Smokeless tobacco: Never  Vaping Use   Vaping status: Never Used  Substance and Sexual Activity   Alcohol use: Yes    Comment: rare   Drug use: No   Sexual activity: Not Currently  Other Topics Concern   Not on file  Social History Narrative   Not on file   Social Drivers of Health   Financial Resource Strain: Not on file  Food Insecurity: Not on file  Transportation Needs: Not on file  Physical Activity: Not on file  Stress: Not on file  Social Connections: Not on file   Past Surgical History:  Procedure Laterality Date    APPENDECTOMY     Past Medical History:  Diagnosis Date   Anxiety    situational   Arthritis    neck/spine   Diabetes mellitus without complication (HCC)    on meds   GERD (gastroesophageal reflux disease)    on meds   Hyperlipidemia    not on meds at this time (11/10/2021)   Hypertension    on meds   Obesity    Seasonal allergies    There were no vitals  taken for this visit.  Opioid Risk Score:   Fall Risk Score:  `1  Depression screen [facility] 2/9     11/26/2023   10:17 AM 08/27/2023    8:14 AM 08/20/2023    9:35 AM 06/18/2023   11:45 AM 05/21/2023    8:15 AM 04/05/2023    2:57 PM 02/19/2023   10:30 AM  Depression screen PHQ 2/9  Decreased Interest 0 0 0 0 0 0 0  Down, Depressed, Hopeless 0 0 0 0 0 0 0  PHQ - 2 Score 0 0 0 0 0 0 0  Altered sleeping 0 0   0 1 0  Tired, decreased energy 2 0   2 1 0  Change in appetite 0 0   0 0 0  Feeling bad or failure about yourself  0 0   0 0 0  Trouble concentrating 0 0   0 0 0  Moving slowly or fidgety/restless 0 0   0 0 0  Suicidal thoughts 0 0   0 0 0  PHQ-9 Score 2 0   2 2 0  Difficult doing work/chores  Not difficult at all   Somewhat difficult Not difficult at all Not difficult at all      Review of Systems  Constitutional: Negative.   HENT: Negative.    Respiratory: Negative.    Cardiovascular: Negative.   Gastrointestinal: Negative.   Endocrine: Negative.   Genitourinary: Negative.   Musculoskeletal:  Positive for back pain and neck pain. Negative for gait problem.       Right shoulder pain Left hip pain Leg knee pain   Skin: Negative.   Allergic/Immunologic: Negative.   Hematological: Negative.   Psychiatric/Behavioral: Negative.    All other systems reviewed and are negative.      Objective:   Physical Exam  Gen: no distress, normal appearing HEENT: oral mucosa pink and moist, NCAT Chest: normal effort, normal rate of breathing Abd: soft, non-distended Ext: no edema Psych: pleasant, normal  affect Skin: No skin breakdown noted in visible portion Neuro:  Moving all 4 to gravity and resistance Sensory exam normal for light touch and pain in all 4 limbs. No limb ataxia or cerebellar signs. No abnormal tone appreciated.   Musculoskeletal:  C-spine with fair ROM Minimal C-spine TTP today L-spine bilateral TTP today Slump test negative Facet loading positive, pain with spinal extension  C spine xray 11/18/20 2 views of the cervical spine shows significant arthritic changes of the  lower cervical spine with several levels of significant degenerative disc  disease and particular osteophytes.  There is also loss of the cervical  lordosis of the lower level.   MRI Cpine 06/09/23  FINDINGS: Alignment: No static listhesis. Loss of the normal cervical lordosis with straightening.   Vertebrae: No acute fracture, evidence of discitis, or aggressive bone lesion.   Cord: Normal signal and morphology.   Posterior Fossa, vertebral arteries, paraspinal tissues: Posterior fossa demonstrates no focal abnormality. Vertebral artery flow voids are maintained. Paraspinal soft tissues are unremarkable.   Disc levels:   Discs: Severe disc height loss at C6-7 and C7-T1. Mild disc height loss at T1-2.   C2-3: No significant disc bulge. No neural foraminal stenosis. No central canal stenosis.   C3-4: No disc protrusion. Mild bilateral foraminal narrowing. No central canal stenosis.   C4-5: No disc protrusion. Moderate bilateral foraminal stenosis. No central canal stenosis.   C5-6: Mild disc bulge. Severe left facet arthropathy. Bilateral uncovertebral degenerative changes. Moderate  right foraminal stenosis. Severe left foraminal stenosis.   C6-7: No disc protrusion. Bilateral uncovertebral degenerative changes. Severe bilateral foraminal stenosis. No central canal stenosis.   C7-T1: Mild disc bulge. Bilateral uncovertebral degenerative changes. Severe bilateral foraminal  stenosis. No central canal stenosis.   T1-2: Broad disc bulge. Bilateral uncovertebral degenerative changes. Moderate bilateral foraminal narrowing.   IMPRESSION: 1. Diffuse cervical spine spondylosis as described above. 2. No acute osseous injury of the cervical spine.      Assessment & Plan    1) Chronic neck pain, with radicular pain into b/l scapula and upper arm.  Patient reports this is the primary cause of his pain at this time.  -C-spine MRI with multilevel spondylosis, multilevel bilateral foraminal stenosis, C5-6 severe left facet arthropathy  - Neck pain has improved since starting gabapentin  2) Hx depression/anxiety on sertraline  3) Gout, on allopurinol   4) Pain in the R shoulder, R elbow, L hip, L knee pain.  Suspect this may be related to OA.  Pain in these joints is doing better 5) Chronic lower back pain  - Suspect may be facet joint mediated  Plan -Continue gabapentin  to 600 mg 3 times daily -Continue ibuprofen  as needed -Patient reports he had limited benefit with physical therapy in the past -Continue flexeril  PRN -Consider Duloxitine , would likely need to DC sertraline  first  -Foods for pain list -Continue tumaric -Continue UDS and pill counts.  Continue PDMP monitoring.  Pain contract completed prior visit. -Discussed bringing pill bottle with any medications to 2 appointments -Consider interventional options later visit.  Suspect may be facet joint mediated could consider MBB/RFA -X-ray L-spine ordered - Continue tramadol  50 mg twice daily as needed, okay to increase to 1-2 tabs as needed  Nursing had ordered repeat routine UDS but I did not see before patient left and did not give it to them today, will need to do it differently routine random visit

## 2023-12-10 ENCOUNTER — Other Ambulatory Visit: Payer: Self-pay | Admitting: Physical Medicine & Rehabilitation

## 2023-12-12 ENCOUNTER — Other Ambulatory Visit: Payer: Self-pay | Admitting: Family Medicine

## 2023-12-12 DIAGNOSIS — E1169 Type 2 diabetes mellitus with other specified complication: Secondary | ICD-10-CM

## 2023-12-12 MED ORDER — EZETIMIBE 10 MG PO TABS
10.0000 mg | ORAL_TABLET | Freq: Every day | ORAL | 3 refills | Status: DC
Start: 2023-12-12 — End: 2024-02-19

## 2023-12-24 ENCOUNTER — Other Ambulatory Visit: Payer: Self-pay | Admitting: Family Medicine

## 2023-12-24 DIAGNOSIS — E118 Type 2 diabetes mellitus with unspecified complications: Secondary | ICD-10-CM

## 2023-12-31 ENCOUNTER — Other Ambulatory Visit

## 2023-12-31 DIAGNOSIS — R5383 Other fatigue: Secondary | ICD-10-CM | POA: Diagnosis not present

## 2024-01-01 LAB — TESTOSTERONE: Testosterone: 219 ng/dL — ABNORMAL LOW (ref 264–916)

## 2024-01-02 ENCOUNTER — Other Ambulatory Visit: Payer: Self-pay | Admitting: Family Medicine

## 2024-01-02 DIAGNOSIS — F320 Major depressive disorder, single episode, mild: Secondary | ICD-10-CM

## 2024-01-14 ENCOUNTER — Other Ambulatory Visit

## 2024-01-14 DIAGNOSIS — R7989 Other specified abnormal findings of blood chemistry: Secondary | ICD-10-CM

## 2024-01-15 ENCOUNTER — Ambulatory Visit: Payer: Self-pay | Admitting: Family Medicine

## 2024-01-15 ENCOUNTER — Telehealth: Payer: Self-pay

## 2024-01-15 DIAGNOSIS — R7989 Other specified abnormal findings of blood chemistry: Secondary | ICD-10-CM

## 2024-01-15 LAB — TESTOSTERONE,FREE AND TOTAL
Testosterone, Free: 15.6 pg/mL (ref 7.2–24.0)
Testosterone: 193 ng/dL — ABNORMAL LOW (ref 264–916)

## 2024-01-15 LAB — FSH/LH
FSH: 2.6 m[IU]/mL (ref 1.5–12.4)
LH: 1.7 m[IU]/mL (ref 1.7–8.6)

## 2024-01-15 NOTE — Telephone Encounter (Signed)
 PA FOR TRAMADOL  50 MG sent in Cover My meds

## 2024-01-15 NOTE — Telephone Encounter (Signed)
 Approved today by [facility] 2017 Approved. Effective Date: 01/15/2024 Authorization Expiration Date: 02/14/2024

## 2024-01-17 ENCOUNTER — Telehealth: Payer: Self-pay | Admitting: *Deleted

## 2024-01-17 LAB — PROLACTIN: Prolactin: 16 ng/mL (ref 3.6–25.2)

## 2024-01-17 LAB — SPECIMEN STATUS REPORT

## 2024-01-17 NOTE — Telephone Encounter (Signed)
 Copied from CRM [id number]. Topic: Clinical - Medication Question >> Jan 17, 2024 10:44 AM Powell HERO wrote: Reason for CRM: sertraline  (ZOLOFT ) 100 MG tablet, patient states he has been feeling down and unmotivated. Thinking he needs to go up on his meds, states feeling the same way he did when he first was prescribed the meds. Declined speaking with triage nurse. Just wanting to know what he should do, if he needs to be seen, etc. Please advise.

## 2024-01-18 ENCOUNTER — Other Ambulatory Visit: Payer: Self-pay | Admitting: Family Medicine

## 2024-01-18 DIAGNOSIS — G8929 Other chronic pain: Secondary | ICD-10-CM

## 2024-01-19 NOTE — Telephone Encounter (Signed)
 Please let the patient know I think it would be more effective to add buproprion once a day to his sertraline  than to go up on the sertraline  anymore.  If he is fine with that I can send in the prescription for 150mg  once a day.

## 2024-01-21 ENCOUNTER — Other Ambulatory Visit: Payer: Self-pay | Admitting: Family Medicine

## 2024-01-21 MED ORDER — BUPROPION HCL ER (XL) 150 MG PO TB24: 150.0000 mg | ORAL_TABLET | Freq: Every day | ORAL | 1 refills | Status: DC

## 2024-01-21 NOTE — Telephone Encounter (Signed)
 Pt is agreeable to start the bupropion .  Please send in the RX.

## 2024-01-30 ENCOUNTER — Other Ambulatory Visit: Payer: Self-pay | Admitting: Family Medicine

## 2024-01-30 DIAGNOSIS — E1169 Type 2 diabetes mellitus with other specified complication: Secondary | ICD-10-CM

## 2024-02-01 ENCOUNTER — Encounter: Payer: Self-pay | Admitting: Physical Medicine & Rehabilitation

## 2024-02-01 ENCOUNTER — Encounter: Attending: Physical Medicine & Rehabilitation | Admitting: Physical Medicine & Rehabilitation

## 2024-02-01 VITALS — BP 111/78 | HR 88 | Ht 73.0 in | Wt 290.0 lb

## 2024-02-01 DIAGNOSIS — M542 Cervicalgia: Secondary | ICD-10-CM | POA: Diagnosis not present

## 2024-02-01 DIAGNOSIS — Z5181 Encounter for therapeutic drug level monitoring: Secondary | ICD-10-CM | POA: Insufficient documentation

## 2024-02-01 DIAGNOSIS — G8929 Other chronic pain: Secondary | ICD-10-CM | POA: Diagnosis not present

## 2024-02-01 DIAGNOSIS — G894 Chronic pain syndrome: Secondary | ICD-10-CM | POA: Insufficient documentation

## 2024-02-01 DIAGNOSIS — M545 Low back pain, unspecified: Secondary | ICD-10-CM | POA: Insufficient documentation

## 2024-02-01 DIAGNOSIS — Z79891 Long term (current) use of opiate analgesic: Secondary | ICD-10-CM | POA: Diagnosis not present

## 2024-02-01 MED ORDER — TRAMADOL HCL 50 MG PO TABS: 50.0000 mg | ORAL_TABLET | Freq: Two times a day (BID) | ORAL | 2 refills | Status: DC | PRN

## 2024-02-01 NOTE — Progress Notes (Signed)
 Subjective:    Patient ID: Eddie Hancock, male    DOB: Jun 20, 1971, 53 y.o.   MRN: [id number]  HPI  Eddie Hancock is a 53 y.o. year old male  who  has a past medical history of Anxiety, Arthritis, Diabetes mellitus without complication (HCC), GERD (gastroesophageal reflux disease), Hyperlipidemia, Hypertension, Obesity, and Seasonal allergies.   They are presenting to PM&R clinic as a new patient for pain management evaluation. They were referred by Dr. Chandra for treatment of chronic neck pain.  Patient reports his neck pain started about 3 years ago and then became severe about 2 years ago. He reports of gravel like feeling in his neck.  Patient is well his whole spine hurts has pain in his lower back however the neck is where the pain is the worst.  Sometimes the pain will shoot into his shoulders and upper arms.  He says sometimes his arms will fall asleep during the day.  He works a very physical job [place].  His back will often be sore after work.  Patient says he does not want medications that are too strong because he does not want them to affect his focus at work.  He reports he is seeing [facility] Dr. Vernetta.  Patient says he was told he did not need surgery at that time.  Patient reports he also has had issues with pain in his left knee and left hip.  Pain in these areas comes and goes.  He also has elbow pain.  Furthermore he reports pain with his right shoulder due to the injury that he sustained as a child.  He has a history of gout and says that affects his ankles, knees and feet.  He reports history of chronic anxiety, on Zoloft .   Red flag symptoms: No red flags for back pain endorsed in Hx or ROS  Medications tried: Topical medications benzocaine- helps a little, icy hot- doesn thelp Nsaids Celebrex ,Relafen , Ibuprofen - all help about the same. He takes ibuprofen  as it is less expensive  Ibuprofen - takes 600 to 800 mg TID  Tylenol  Helps a  little, takes in the AM Opiates Denies  Gabapentin  / Lyrica - denies  TCAs - Denies  SNRIs- Denies  Robaxin  helps a little  Flexeril  will help however he will take this after work because it can be sedating for him Turmeric helps  Other treatments: PT- helps for that day only, not long term benefit  TENs unit - helps  Injections - Denies     Interval history 06/18/2023 Eddie Hancock is here for follow-up of his chronic pain in his neck, lower back and several other joints.  Patient reports that since starting gabapentin  his neck range of motion his significantly improved.  He is noticing been able to look left and right much further than he was able to previously.  Patient says he did not notice it until asked him today but he thinks pain in his joints reported previously is also decreased from prior visits.  He did develop some lower back pain about a week ago and he thinks this occurred when he was out of the gabapentin  briefly.  He is not having any side effects with gabapentin .  Interval history 08/20/2023 Eddie Hancock is here for follow-up regarding his chronic pain.  Pain is the worst in his neck and lower back.  He also has pain in other peripheral joints but this is not as severe recently.  We  reviewed MRI indicating multiple levels of cervical spinal spondylosis.  Patient reports gabapentin  is helping his pain a lot most days, He will occasionally have exacerbations a couple times a month when the pain is very severe.  Interval history 12/07/23 Eddie Hancock reports his neck pain and other joint pain is doing much better since he started gabapentin .  Currently also his pain is in his lower back.  He has had back pain for many years and has been gradually worsening.  It started when he pulled his back at work many years ago.  Pain is primarily axial and is not having significant issues with sciatica.  Sometimes laying on a hard surface will help his low back.  Tramadol  takes the edge off his pain, he  has been using it sparingly only when the pain is very severe.  No side effects with the medication.  Interval history 02/01/24 Patient reports neck pain continues to be improved since starting the gabapentin .  Continues to have pain in his lower back, pain is primarily axial.  Tramadol  is helping reduce his pain.  He reports he tried getting a refill for this and had an issue, he thought it was declined.  This does help his back pain especially takes 100 mg dose.  No side effects of the medication.   Prior UDS results: No results found for: LABOPIA, COCAINSCRNUR, LABBENZ, AMPHETMU, THCU, LABBARB   Pain Inventory Average Pain 4 Pain Right Now 6 My pain is intermittent, constant, and aching  In the last 24 hours, has pain interfered with the following? General activity 6 Relation with others 0 Enjoyment of life 6 What TIME of day is your pain at its worst? morning  Sleep (in general) Good  Pain is worse with: bending and some activites Pain improves with: rest, heat/ice, medication, and heat Relief from Meds: 5    Family History  Problem Relation Age of Onset   Hypertension Mother    Learning disabilities Maternal Grandmother    Learning disabilities Maternal Grandfather    Diabetes Daughter    Colon polyps Neg Hx    Colon cancer Neg Hx    Esophageal cancer Neg Hx    Stomach cancer Neg Hx    Rectal cancer Neg Hx    Social History   Socioeconomic History   Marital status: Married    Spouse name: Not on file   Number of children: Not on file   Years of education: Not on file   Highest education level: Not on file  Occupational History   Not on file  Tobacco Use   Smoking status: Former    Current packs/day: 0.00    Types: Cigarettes    Quit date: 11/07/2016    Years since quitting: 7.2    Passive exposure: Never   Smokeless tobacco: Never  Vaping Use   Vaping status: Never Used  Substance and Sexual Activity   Alcohol use: Yes    Comment: rare   Drug  use: No   Sexual activity: Not Currently  Other Topics Concern   Not on file  Social History Narrative   Not on file   Social Drivers of Health   Financial Resource Strain: Not on file  Food Insecurity: Not on file  Transportation Needs: Not on file  Physical Activity: Not on file  Stress: Not on file  Social Connections: Not on file   Past Surgical History:  Procedure Laterality Date   APPENDECTOMY     Past Medical History:  Diagnosis Date   Anxiety    situational   Arthritis    neck/spine   Diabetes mellitus without complication (HCC)    on meds   GERD (gastroesophageal reflux disease)    on meds   Hyperlipidemia    not on meds at this time (11/10/2021)   Hypertension    on meds   Obesity    Seasonal allergies    BP 111/78   Pulse 88   Ht 6' 1 (1.854 m)   Wt 290 lb (131.5 kg)   SpO2 94%   BMI 38.26 kg/m   Opioid Risk Score:   Fall Risk Score:  `1  Depression screen [facility] 2/9     02/01/2024    1:56 PM 12/07/2023    1:47 PM 11/26/2023   10:17 AM 08/27/2023    8:14 AM 08/20/2023    9:35 AM 06/18/2023   11:45 AM 05/21/2023    8:15 AM  Depression screen PHQ 2/9  Decreased Interest 0 0 0 0 0 0 0  Down, Depressed, Hopeless 0 0 0 0 0 0 0  PHQ - 2 Score 0 0 0 0 0 0 0  Altered sleeping   0 0   0  Tired, decreased energy   2 0   2  Change in appetite   0 0   0  Feeling bad or failure about yourself    0 0   0  Trouble concentrating   0 0   0  Moving slowly or fidgety/restless   0 0   0  Suicidal thoughts   0 0   0  PHQ-9 Score   2 0   2  Difficult doing work/chores    Not difficult at all   Somewhat difficult      Review of Systems  Constitutional: Negative.   HENT: Negative.    Respiratory: Negative.    Cardiovascular: Negative.   Gastrointestinal: Negative.   Endocrine: Negative.   Genitourinary: Negative.   Musculoskeletal:  Positive for back pain and neck pain. Negative for gait problem.       Right shoulder pain Left hip pain Leg knee pain    Skin: Negative.   Allergic/Immunologic: Negative.   Hematological: Negative.   Psychiatric/Behavioral: Negative.    All other systems reviewed and are negative.      Objective:   Physical Exam  Gen: no distress, normal appearing HEENT: oral mucosa pink and moist, NCAT Chest: normal effort, normal rate of breathing Abd: soft, non-distended Ext: no edema Psych: pleasant, normal affect Skin: No skin breakdown noted in visible portion Neuro:  Moving all 4 to gravity and resistance Sensory exam normal for light touch and pain in all 4 limbs. No limb ataxia or cerebellar signs. No abnormal tone appreciated.   Musculoskeletal:  TTP lumbar spine paraspinal muscles primarily in the mid lumbar spine Facet loading positive impingement extension Slump test negative bilaterally  C spine xray 11/18/20 2 views of the cervical spine shows significant arthritic changes of the  lower cervical spine with several levels of significant degenerative disc  disease and particular osteophytes.  There is also loss of the cervical  lordosis of the lower level.   MRI Cpine 06/09/23  FINDINGS: Alignment: No static listhesis. Loss of the normal cervical lordosis with straightening.   Vertebrae: No acute fracture, evidence of discitis, or aggressive bone lesion.   Cord: Normal signal and morphology.   Posterior Fossa, vertebral arteries, paraspinal tissues: Posterior fossa demonstrates no focal abnormality.  Vertebral artery flow voids are maintained. Paraspinal soft tissues are unremarkable.   Disc levels:   Discs: Severe disc height loss at C6-7 and C7-T1. Mild disc height loss at T1-2.   C2-3: No significant disc bulge. No neural foraminal stenosis. No central canal stenosis.   C3-4: No disc protrusion. Mild bilateral foraminal narrowing. No central canal stenosis.   C4-5: No disc protrusion. Moderate bilateral foraminal stenosis. No central canal stenosis.   C5-6: Mild disc bulge.  Severe left facet arthropathy. Bilateral uncovertebral degenerative changes. Moderate right foraminal stenosis. Severe left foraminal stenosis.   C6-7: No disc protrusion. Bilateral uncovertebral degenerative changes. Severe bilateral foraminal stenosis. No central canal stenosis.   C7-T1: Mild disc bulge. Bilateral uncovertebral degenerative changes. Severe bilateral foraminal stenosis. No central canal stenosis.   T1-2: Broad disc bulge. Bilateral uncovertebral degenerative changes. Moderate bilateral foraminal narrowing.   IMPRESSION: 1. Diffuse cervical spine spondylosis as described above. 2. No acute osseous injury of the cervical spine.   12/07/2023 lumbar spine x-ray FINDINGS: Mild retrolisthesis of L2 on L3 and L3 on L4. Extensive multilevel degenerative disc disease of the lower thoracic and lumbar spine most pronounced L2-3, L3-4 and L5-S1. Lower lumbar spine facet degenerative changes. SI joints are unremarkable.   IMPRESSION: Extensive multilevel degenerative disc and facet disease.     Assessment & Plan    1) Chronic neck pain, with radicular pain into b/l scapula and upper arm.  Patient reports this is the primary cause of his pain at this time.  -C-spine MRI with multilevel spondylosis, multilevel bilateral foraminal stenosis, C5-6 severe left facet arthropathy  - Neck pain has improved since starting gabapentin  2) Hx depression/anxiety on sertraline  3) Gout, on allopurinol   4) Pain in the R shoulder, R elbow, L hip, L knee pain.  Suspect this may be related to OA.  Pain in these joints is doing better 5) Chronic lower back pain  - Suspect may be facet joint mediated-x-rays indicate disc and facet joint degenerative changes  Plan -Continue gabapentin  to 600 mg 3 times daily -Continue ibuprofen  as needed -Patient reports he had limited benefit with physical therapy in the past -Provided list of home exercises to complete 3 times a week -Continue flexeril   PRN -Consider Duloxitine , would likely need to DC sertraline  first  -Foods for pain list -Continue tumaric -Continue UDS and pill counts.  Continue PDMP monitoring.  Pain contract completed prior visit. -Discussed bringing pill bottle with any medications to appointments -Consider MBB/RFA in mid lumbar spine - Continue tramadol  50 mg 1-2 tabs twice daily as needed

## 2024-02-10 ENCOUNTER — Other Ambulatory Visit: Payer: Self-pay | Admitting: Physical Medicine & Rehabilitation

## 2024-02-19 ENCOUNTER — Other Ambulatory Visit: Payer: Self-pay | Admitting: Family Medicine

## 2024-02-19 DIAGNOSIS — E1169 Type 2 diabetes mellitus with other specified complication: Secondary | ICD-10-CM

## 2024-02-27 ENCOUNTER — Other Ambulatory Visit: Payer: Self-pay | Admitting: Family Medicine

## 2024-02-27 DIAGNOSIS — E118 Type 2 diabetes mellitus with unspecified complications: Secondary | ICD-10-CM

## 2024-03-03 ENCOUNTER — Encounter: Payer: Self-pay | Admitting: Family Medicine

## 2024-03-03 ENCOUNTER — Ambulatory Visit: Admitting: Family Medicine

## 2024-03-03 ENCOUNTER — Ambulatory Visit (INDEPENDENT_AMBULATORY_CARE_PROVIDER_SITE_OTHER): Admitting: Family Medicine

## 2024-03-03 VITALS — BP 135/88 | HR 103 | Ht 73.0 in | Wt 282.0 lb

## 2024-03-03 DIAGNOSIS — Z23 Encounter for immunization: Secondary | ICD-10-CM | POA: Diagnosis not present

## 2024-03-03 DIAGNOSIS — E782 Mixed hyperlipidemia: Secondary | ICD-10-CM

## 2024-03-03 DIAGNOSIS — M545 Low back pain, unspecified: Secondary | ICD-10-CM

## 2024-03-03 DIAGNOSIS — E1169 Type 2 diabetes mellitus with other specified complication: Secondary | ICD-10-CM

## 2024-03-03 DIAGNOSIS — Z7984 Long term (current) use of oral hypoglycemic drugs: Secondary | ICD-10-CM

## 2024-03-03 DIAGNOSIS — E118 Type 2 diabetes mellitus with unspecified complications: Secondary | ICD-10-CM

## 2024-03-03 DIAGNOSIS — G8929 Other chronic pain: Secondary | ICD-10-CM

## 2024-03-03 LAB — POCT GLYCOSYLATED HEMOGLOBIN (HGB A1C): HbA1c POC (<> result, manual entry): 6 % (ref 4.0–5.6)

## 2024-03-03 NOTE — Patient Instructions (Signed)
 It was nice to see you today,  We addressed the following topics today: -Your A1c was 6.0.  This is much better. - I will hold off increasing your Mounjaro  until you feel like it is not as effective.  You are losing weight currently so there is no need to increase it especially if your A1c is controlled. - You will need to get some labs next weeks and then again before you see us  in January. - Try to schedule a eye appointment for diabetic retinopathy screening.  Have a great day,  Rolan Slain, MD

## 2024-03-03 NOTE — Progress Notes (Unsigned)
 Established Patient Office Visit  Subjective   Patient ID: Eddie Hancock, male    DOB: Dec 31, 1970  Age: 53 y.o. MRN: [id number]  Chief Complaint  Patient presents with   Medical Management of Chronic Issues    HPI  Subjective - Follow-up for diabetes and weight management. Reports no new issues or concerns. - Reports 20-pound weight loss over the last 3 months, which is considered a healthy rate of loss. - Mounjaro  is curbing appetite and has noticed a decreased craving for sugary drinks. - Reports some forgetfulness and feeling spacey, which is attributed to Gabapentin . - Complains of pain along the entire spine secondary to degenerative disc disease.  Medications Continues Mounjaro  at current dose, reports it is working well. Continues Metformin . Continues Bupropion , which was started recently and may be contributing to decreased cravings. Continues Gabapentin  1800 mg daily for spinal pain. Has a recent refill of Mounjaro .  PMH, PSH, FH, Social Hx PMHx: Type 2 diabetes (last A1C was 6.0, down from 7.7), degenerative disc disease. History of steroid use which elevated A1C previously. Social Hx: Experiences difficulty with medication stock at [employer].  ROS Constitutional: Reports weight loss. Endocrine: Reports decreased appetite and cravings for sweets. No polydipsia or polyuria mentioned. Psychiatric: Reports forgetfulness/spaciness. MSK: Reports spinal pain.  Objective General: No acute distress.  Assessment and Plan Type 2 Diabetes Mellitus, controlled - A1C has improved to 6.0 from 7.7. Also on Mounjaro  for weight loss, which has been successful with a 20-pound loss over 3 months. Appetite and cravings are well-controlled. - Continue Mounjaro  at current dose. May increase if it becomes less effective; advised to send a message if this occurs before next appointment. - Continue Metformin . - Plan for fasting labs (CMP, lipid panel) in the next few weeks to  check kidney/liver function and cholesterol. - Follow up in January for annual diabetes review, including foot exam and urine studies. - Advised on need for annual diabetic eye exam.  Spinal Pain - Chronic pain along the spine secondary to degenerative disc disease. Managed with Gabapentin  1800 mg daily. - Reports side effect of forgetfulness from Gabapentin . - No changes to management at this time.    The 10-year ASCVD risk score (Arnett DK, et al., 2019) is: 8%  Health Maintenance Due  Topic Date Due   HIV Screening  Never done   Hepatitis C Screening  Never done   Hepatitis B Vaccines 19-59 Average Risk (1 of 3 - 19+ 3-dose series) Never done   Pneumococcal Vaccine: 50+ Years (2 of 2 - PCV) 06/21/2018   OPHTHALMOLOGY EXAM  09/03/2018   COVID-19 Vaccine (4 - 2024-25 season) 03/11/2023   Diabetic kidney evaluation - eGFR measurement  02/19/2024   INFLUENZA VACCINE  02/08/2024      Objective:     BP 135/88   Pulse (!) 103   Ht 6' 1 (1.854 m)   Wt 282 lb (127.9 kg)   SpO2 97%   BMI 37.21 kg/m  {Vitals History (Optional):23777}  Physical Exam   Results for orders placed or performed in visit on 03/03/24  POCT glycosylated hemoglobin (Hb A1C)  Result Value Ref Range   Hemoglobin A1C     HbA1c POC (<> result, manual entry) 6.0 4.0 - 5.6 %   HbA1c, POC (prediabetic range)     HbA1c, POC (controlled diabetic range)          Assessment & Plan:   Controlled type 2 diabetes mellitus with complication, without long-term current use  of insulin (HCC) -     POCT glycosylated hemoglobin (Hb A1C)  Mixed diabetic hyperlipidemia associated with type 2 diabetes mellitus (HCC) -     POCT glycosylated hemoglobin (Hb A1C)     No follow-ups on file.    Toribio MARLA Slain, MD

## 2024-03-04 ENCOUNTER — Other Ambulatory Visit: Payer: Self-pay | Admitting: Family Medicine

## 2024-03-04 DIAGNOSIS — M1A9XX0 Chronic gout, unspecified, without tophus (tophi): Secondary | ICD-10-CM

## 2024-03-04 NOTE — Assessment & Plan Note (Signed)
-   Chronic pain along the spine secondary to degenerative disc disease. Managed with Gabapentin  1800 mg daily. - Reports side effect of forgetfulness from Gabapentin . - No changes to management at this time.

## 2024-03-04 NOTE — Assessment & Plan Note (Signed)
 Continue mounjaro  5mg .  Has lost ~ 2 lbs a week since starting this medication.

## 2024-03-04 NOTE — Assessment & Plan Note (Signed)
-   A1C has improved to 6.0 from 7.7. Also on Mounjaro  for weight loss, which has been successful with a 20-pound loss over 3 months. Appetite and cravings are well-controlled. - Continue Mounjaro  at current dose. May increase if it becomes less effective; advised to send a message if this occurs before next appointment. - Continue Metformin . - Plan for fasting labs (CMP, lipid panel) in the next few weeks to check kidney/liver function and cholesterol. - Follow up in January for annual diabetes review, including foot exam and urine studies. - Advised on need for annual diabetic eye exam.

## 2024-03-13 ENCOUNTER — Telehealth: Payer: Self-pay | Admitting: *Deleted

## 2024-03-13 NOTE — Telephone Encounter (Signed)
 LVM for pt to call office to schedule a lab appt.  At the last visit Dr. Chandra wanted labs in a week or 2 after his last appt and then again a week before his physical.  The physical and the labs for that were scheduled but not the other lab appt.  Please assist patient in getting this scheduled at your earliest convenience. It would be a fasting lab.

## 2024-03-17 NOTE — Telephone Encounter (Signed)
This was scheduled

## 2024-03-17 NOTE — Telephone Encounter (Signed)
 Copied from CRM [id number]. Topic: Clinical - Lab/Test Results >> Mar 17, 2024  3:13 PM Tiffini S wrote: Reason for CRM: Patient called to schedule fasting labs for 03/19/2024- please schedule a lab order

## 2024-03-19 ENCOUNTER — Other Ambulatory Visit

## 2024-03-19 DIAGNOSIS — E118 Type 2 diabetes mellitus with unspecified complications: Secondary | ICD-10-CM

## 2024-03-19 DIAGNOSIS — E1169 Type 2 diabetes mellitus with other specified complication: Secondary | ICD-10-CM | POA: Diagnosis not present

## 2024-03-19 DIAGNOSIS — R7989 Other specified abnormal findings of blood chemistry: Secondary | ICD-10-CM

## 2024-03-19 DIAGNOSIS — E782 Mixed hyperlipidemia: Secondary | ICD-10-CM | POA: Diagnosis not present

## 2024-03-20 ENCOUNTER — Ambulatory Visit: Payer: Self-pay | Admitting: Family Medicine

## 2024-03-25 LAB — COMPREHENSIVE METABOLIC PANEL WITH GFR
ALT: 26 IU/L (ref 0–44)
AST: 29 IU/L (ref 0–40)
Albumin: 5 g/dL — ABNORMAL HIGH (ref 3.8–4.9)
Alkaline Phosphatase: 70 IU/L (ref 44–121)
BUN/Creatinine Ratio: 16 (ref 9–20)
BUN: 18 mg/dL (ref 6–24)
Bilirubin Total: 0.9 mg/dL (ref 0.0–1.2)
Calcium: 9.3 mg/dL (ref 8.7–10.2)
Chloride: 104 mmol/L (ref 96–106)
Creatinine, Ser: 1.15 mg/dL (ref 0.76–1.27)
Globulin, Total: 2.1 g/dL (ref 1.5–4.5)
Glucose: 102 mg/dL — ABNORMAL HIGH (ref 70–99)
Potassium: 5.3 mmol/L — ABNORMAL HIGH (ref 3.5–5.2)
Sodium: 140 mmol/L (ref 134–144)
Total Protein: 7.1 g/dL (ref 6.0–8.5)
eGFR: 77 mL/min/1.73 (ref >59)

## 2024-03-25 LAB — LIPID PANEL
Chol/HDL Ratio: 2.8 ratio (ref 0.0–5.0)
Cholesterol, Total: 73 mg/dL — ABNORMAL LOW (ref 100–199)
HDL: 26 mg/dL — ABNORMAL LOW (ref >39)
LDL Chol Calc (NIH): 18 mg/dL (ref 0–99)
Triglycerides: 178 mg/dL — ABNORMAL HIGH (ref 0–149)
VLDL Cholesterol Cal: 29 mg/dL (ref 5–40)

## 2024-04-01 ENCOUNTER — Other Ambulatory Visit: Payer: Self-pay

## 2024-04-01 DIAGNOSIS — F320 Major depressive disorder, single episode, mild: Secondary | ICD-10-CM

## 2024-04-03 ENCOUNTER — Other Ambulatory Visit: Payer: Self-pay | Admitting: Family Medicine

## 2024-04-03 DIAGNOSIS — G8929 Other chronic pain: Secondary | ICD-10-CM

## 2024-04-18 MED ORDER — GABAPENTIN 600 MG PO TABS: 600.0000 mg | ORAL_TABLET | Freq: Three times a day (TID) | ORAL | 5 refills | Status: DC

## 2024-04-18 NOTE — Addendum Note (Signed)
 Addended by: URBANO ALBRIGHT on: 04/18/2024 05:19 PM   Modules accepted: Orders

## 2024-04-25 ENCOUNTER — Encounter: Attending: Physical Medicine & Rehabilitation | Admitting: Physical Medicine & Rehabilitation

## 2024-04-25 ENCOUNTER — Encounter: Payer: Self-pay | Admitting: Physical Medicine & Rehabilitation

## 2024-04-25 VITALS — BP 104/73 | HR 91 | Ht 73.0 in | Wt 271.0 lb

## 2024-04-25 DIAGNOSIS — M546 Pain in thoracic spine: Secondary | ICD-10-CM | POA: Diagnosis not present

## 2024-04-25 DIAGNOSIS — M542 Cervicalgia: Secondary | ICD-10-CM | POA: Insufficient documentation

## 2024-04-25 DIAGNOSIS — Z5181 Encounter for therapeutic drug level monitoring: Secondary | ICD-10-CM | POA: Insufficient documentation

## 2024-04-25 DIAGNOSIS — G8929 Other chronic pain: Secondary | ICD-10-CM | POA: Insufficient documentation

## 2024-04-25 DIAGNOSIS — G894 Chronic pain syndrome: Secondary | ICD-10-CM | POA: Diagnosis not present

## 2024-04-25 DIAGNOSIS — M545 Low back pain, unspecified: Secondary | ICD-10-CM | POA: Diagnosis not present

## 2024-04-25 MED ORDER — MELOXICAM 7.5 MG PO TABS: 7.5000 mg | ORAL_TABLET | Freq: Every day | ORAL | 3 refills | Status: DC | PRN

## 2024-04-25 MED ORDER — LIDOCAINE 4 % EX PTCH: 1.0000 | MEDICATED_PATCH | CUTANEOUS | 4 refills | Status: AC

## 2024-04-25 NOTE — Progress Notes (Signed)
 Subjective:    Patient ID: Eddie Hancock, male    DOB: Apr 15, 1971, 53 y.o.   MRN: [id number]  HPI  Eddie Hancock is a 53 y.o. year old male  who  has a past medical history of Anxiety, Arthritis, Diabetes mellitus without complication (HCC), GERD (gastroesophageal reflux disease), Hyperlipidemia, Hypertension, Obesity, and Seasonal allergies.   They are presenting to PM&R clinic as a new patient for pain management evaluation. They were referred by Dr. Chandra for treatment of chronic neck pain.  Patient reports his neck pain started about 3 years ago and then became severe about 2 years ago. He reports of gravel like feeling in his neck.  Patient is well his whole spine hurts has pain in his lower back however the neck is where the pain is the worst.  Sometimes the pain will shoot into his shoulders and upper arms.  He says sometimes his arms will fall asleep during the day.  He works a very physical [place].  His back will often be sore after work.  Patient says he does not want medications that are too strong because he does not want them to affect his focus at work.  He reports he is seeing [facility] Dr. Vernetta.  Patient says he was told he did not need surgery at that time.  Patient reports he also has had issues with pain in his left knee and left hip.  Pain in these areas comes and goes.  He also has elbow pain.  Furthermore he reports pain with his right shoulder due to the injury that he sustained as a child.  He has a history of gout and says that affects his ankles, knees and feet.  He reports history of chronic anxiety, on Zoloft .   Red flag symptoms: No red flags for back pain endorsed in Hx or ROS  Medications tried: Topical medications benzocaine- helps a little, icy hot- doesn thelp Nsaids Celebrex ,Relafen , Ibuprofen - all help about the same. He takes ibuprofen  as it is less expensive  Ibuprofen - takes 600 to 800 mg TID  Tylenol  Helps a  little, takes in the AM Opiates Denies  Gabapentin  / Lyrica - denies  TCAs - Denies  SNRIs- Denies  Robaxin  helps a little  Flexeril  will help however he will take this after work because it can be sedating for him Turmeric helps  Other treatments: PT- helps for that day only, not long term benefit  TENs unit - helps  Injections - Denies     Interval history 06/18/2023 Eddie Hancock is here for follow-up of his chronic pain in his neck, lower back and several other joints.  Patient reports that since starting gabapentin  his neck range of motion his significantly improved.  He is noticing been able to look left and right much further than he was able to previously.  Patient says he did not notice it until asked him today but he thinks pain in his joints reported previously is also decreased from prior visits.  He did develop some lower back pain about a week ago and he thinks this occurred when he was out of the gabapentin  briefly.  He is not having any side effects with gabapentin .  Interval history 08/20/2023 Eddie Hancock is here for follow-up regarding his chronic pain.  Pain is the worst in his neck and lower back.  He also has pain in other peripheral joints but this is not as severe recently.  We  reviewed MRI indicating multiple levels of cervical spinal spondylosis.  Patient reports gabapentin  is helping his pain a lot most days, He will occasionally have exacerbations a couple times a month when the pain is very severe.  Interval history 12/07/23 Eddie Hancock reports his neck pain and other joint pain is doing much better since he started gabapentin .  Currently also his pain is in his lower back.  He has had back pain for many years and has been gradually worsening.  It started when he pulled his back at work many years ago.  Pain is primarily axial and is not having significant issues with sciatica.  Sometimes laying on a hard surface will help his low back.  Tramadol  takes the edge off his pain, he  has been using it sparingly only when the pain is very severe.  No side effects with the medication.  Interval history 02/01/24 Patient reports neck pain continues to be improved since starting the gabapentin .  Continues to have pain in his lower back, pain is primarily axial.  Tramadol  is helping reduce his pain.  He reports he tried getting a refill for this and had an issue, he thought it was declined.  This does help his back pain especially takes 100 mg dose.  No side effects of the medication.  Interval history 04/25/24 Reports new lower/mid-thoracic back pain for the last couple of weeks. Describes it as a constant ache with sharp, stabbing pain on twisting movements, such as getting out of bed. Rates pain as significant. The new pain is located higher than prior pain he had around his L spine.  Neck pain is significantly improved and is no longer a primary concern. Reports nearly full range of motion with some tightness on extreme rotation. Lower back pain is also improved and feels more under control.  Reports that home exercises from the last visit did not provide significant relief, though stretching helps. Finds some relief by lying flat on their back for 10 minutes after work.  Medications Current medication regimen provides reasonable control, aside from the new mid-back pain. - Tramadol : Reports it helps with function by reducing the perception of pain without causing sedation or cognitive impairment. Uses 1-2 tablets PRN, sometimes every 6 hours on weekends, but stays within the prescribed maximum of 4 tablets per day. - Gabapentin : Continues use. - Flexeril : Continues use PRN at night. - TENS unit: Continues to use with some benefit. - NSAIDs: Currently using naproxen but feels it is not very effective and plans to switch back to ibuprofen . Reports taking Tylenol Arthritis in the mornings.  Inquired about using meloxicam  with tramadol .  Prior UDS results: No results found for:  LABOPIA, COCAINSCRNUR, LABBENZ, AMPHETMU, THCU, LABBARB   Pain Inventory Average Pain 4 Pain Right Now 6 My pain is constant, sharp, and dull  In the last 24 hours, has pain interfered with the following? General activity 6 Relation with others 0 Enjoyment of life 6 What TIME of day is your pain at its worst? morning , daytime, evening, and night Sleep (in general) Good  Pain is worse with: bending and standing Pain improves with: rest, heat/ice, and TENS Relief from Meds: 5    Family History  Problem Relation Age of Onset   Hypertension Mother    Learning disabilities Maternal Grandmother    Learning disabilities Maternal Grandfather    Diabetes Daughter    Colon polyps Neg Hx    Colon cancer Neg Hx    Esophageal cancer Neg  Hx    Stomach cancer Neg Hx    Rectal cancer Neg Hx    Social History   Socioeconomic History   Marital status: Married    Spouse name: Not on file   Number of children: Not on file   Years of education: Not on file   Highest education level: Not on file  Occupational History   Not on file  Tobacco Use   Smoking status: Former    Current packs/day: 0.00    Types: Cigarettes    Quit date: 11/07/2016    Years since quitting: 7.4    Passive exposure: Never   Smokeless tobacco: Never  Vaping Use   Vaping status: Never Used  Substance and Sexual Activity   Alcohol use: Yes    Comment: rare   Drug use: No   Sexual activity: Not Currently  Other Topics Concern   Not on file  Social History Narrative   Not on file   Social Drivers of Health   Financial Resource Strain: Not on file  Food Insecurity: Not on file  Transportation Needs: Not on file  Physical Activity: Not on file  Stress: Not on file  Social Connections: Not on file   Past Surgical History:  Procedure Laterality Date   APPENDECTOMY     Past Medical History:  Diagnosis Date   Anxiety    situational   Arthritis    neck/spine   Diabetes mellitus without  complication (HCC)    on meds   GERD (gastroesophageal reflux disease)    on meds   Hyperlipidemia    not on meds at this time (11/10/2021)   Hypertension    on meds   Obesity    Seasonal allergies    BP 104/73   Pulse 91   Ht 6' 1 (1.854 m)   Wt 271 lb (122.9 kg)   SpO2 97%   BMI 35.75 kg/m   Opioid Risk Score:   Fall Risk Score:  `1  Depression screen [facility] 2/9     04/25/2024    2:59 PM 03/03/2024    2:40 PM 02/01/2024    1:56 PM 12/07/2023    1:47 PM 11/26/2023   10:17 AM 08/27/2023    8:14 AM 08/20/2023    9:35 AM  Depression screen PHQ 2/9  Decreased Interest 0 1 0 0 0 0 0  Down, Depressed, Hopeless 0 2 0 0 0 0 0  PHQ - 2 Score 0 3 0 0 0 0 0  Altered sleeping  1   0 0   Tired, decreased energy  1   2 0   Change in appetite  0   0 0   Feeling bad or failure about yourself   0   0 0   Trouble concentrating  0   0 0   Moving slowly or fidgety/restless  0   0 0   Suicidal thoughts  0   0 0   PHQ-9 Score  5   2 0   Difficult doing work/chores  Somewhat difficult    Not difficult at all       Review of Systems  Constitutional: Negative.   HENT: Negative.    Respiratory: Negative.    Cardiovascular: Negative.   Gastrointestinal: Negative.   Endocrine: Negative.   Genitourinary: Negative.   Musculoskeletal:  Positive for back pain.       Right shoulder pain Left hip pain Leg knee pain   Skin: Negative.   Allergic/Immunologic:  Negative.   Hematological: Negative.   Psychiatric/Behavioral: Negative.    All other systems reviewed and are negative.      Objective:   Physical Exam  Gen: no distress, normal appearing HEENT: oral mucosa pink and moist, NCAT Chest: normal effort, normal rate of breathing Abd: soft, non-distended Ext: no edema Psych: pleasant, normal affect Skin: No skin breakdown noted in visible portion Neuro:  Moving all 4 to gravity and resistance Sensory exam normal for light touch and pain in all 4 limbs. No limb ataxia or  cerebellar signs. No abnormal tone appreciated.   Musculoskeletal:  Examination focused on the back. Tenderness to palpation over the mid-thoracic spine. Axial loading through the shoulders reproduces the mid-thoracic pain. Slump test negative bilaterally  C spine xray 11/18/20 2 views of the cervical spine shows significant arthritic changes of the  lower cervical spine with several levels of significant degenerative disc  disease and particular osteophytes.  There is also loss of the cervical  lordosis of the lower level.   MRI [facility] 06/09/23  FINDINGS: Alignment: No static listhesis. Loss of the normal cervical lordosis with straightening.   Vertebrae: No acute fracture, evidence of discitis, or aggressive bone lesion.   Cord: Normal signal and morphology.   Posterior Fossa, vertebral arteries, paraspinal tissues: Posterior fossa demonstrates no focal abnormality. Vertebral artery flow voids are maintained. Paraspinal soft tissues are unremarkable.   Disc levels:   Discs: Severe disc height loss at C6-7 and C7-T1. Mild disc height loss at T1-2.   C2-3: No significant disc bulge. No neural foraminal stenosis. No central canal stenosis.   C3-4: No disc protrusion. Mild bilateral foraminal narrowing. No central canal stenosis.   C4-5: No disc protrusion. Moderate bilateral foraminal stenosis. No central canal stenosis.   C5-6: Mild disc bulge. Severe left facet arthropathy. Bilateral uncovertebral degenerative changes. Moderate right foraminal stenosis. Severe left foraminal stenosis.   C6-7: No disc protrusion. Bilateral uncovertebral degenerative changes. Severe bilateral foraminal stenosis. No central canal stenosis.   C7-T1: Mild disc bulge. Bilateral uncovertebral degenerative changes. Severe bilateral foraminal stenosis. No central canal stenosis.   T1-2: Broad disc bulge. Bilateral uncovertebral degenerative changes. Moderate bilateral foraminal  narrowing.   IMPRESSION: 1. Diffuse cervical spine spondylosis as described above. 2. No acute osseous injury of the cervical spine.   12/07/2023 lumbar spine x-ray FINDINGS: Mild retrolisthesis of L2 on L3 and L3 on L4. Extensive multilevel degenerative disc disease of the lower thoracic and lumbar spine most pronounced L2-3, L3-4 and L5-S1. Lower lumbar spine facet degenerative changes. SI joints are unremarkable.   IMPRESSION: Extensive multilevel degenerative disc and facet disease.     Assessment & Plan    1) Chronic neck pain, with radicular pain into b/l scapula and upper arm.  Patient reports this is the primary cause of his pain at this time.  -C-spine MRI with multilevel spondylosis, multilevel bilateral foraminal stenosis, C5-6 severe left facet arthropathy  - Neck pain has improved since starting gabapentin  2) Hx depression/anxiety on sertraline  3) Gout, on allopurinol   4) Pain in the R shoulder, R elbow, L hip, L knee pain.  Suspect this may be related to OA.  Pain in these joints is doing better 5) Chronic lower back pain  - Suspect may be facet joint mediated-x-rays indicate disc and facet joint degenerative changes 6)New-onset mid-thoracic back pain, suspicious for arthritic changes  Plan -Continue gabapentin  to 600 mg 3 times daily -Patient reports he had limited benefit with physical therapy in the  past -Provided list of home exercises to complete 3 times a week- pt reports minimal benefit after trying for 6 weeks -Consider Duloxitine , would likely need to DC sertraline  first  -Foods for pain list -Continue tumaric -Continue UDS and pill counts.  Continue PDMP monitoring.  Pain contract completed prior visit. -Discussed bringing pill bottle with any medications to appointments -Consider MBB/RFA in mid lumbar spine- hold off for now as pain recently more around this thoracic spine - Imaging: X-ray of the thoracic spine to evaluate the new area of pain. Sent  order to [facility]. - Continue Tramadol  50 mg, 1-2 tablets PO BID PRN.  - Prescribed Meloxicam  7.5 mg daily PRN as an alternative to ibuprofen /naproxen. Advised to use only one NSAID at a time and to use them sparingly. Counseled on risks of NSAIDs (kidney injury, GI bleed, cardiovascular events), especially in combination with Zoloft . - Prescribed Lidocaine 5% patches. Advised to apply one patch to the affected area for 12 hours on, 12 hours off. Recommended trying OTC 4% patches first to assess cost and efficacy. - Continue gabapentin  and Flexeril  as before.

## 2024-04-27 ENCOUNTER — Other Ambulatory Visit: Payer: Self-pay | Admitting: Family Medicine

## 2024-04-27 DIAGNOSIS — E118 Type 2 diabetes mellitus with unspecified complications: Secondary | ICD-10-CM

## 2024-04-30 ENCOUNTER — Other Ambulatory Visit: Payer: Self-pay | Admitting: Family Medicine

## 2024-05-04 ENCOUNTER — Other Ambulatory Visit: Payer: Self-pay | Admitting: Family Medicine

## 2024-05-04 DIAGNOSIS — E1169 Type 2 diabetes mellitus with other specified complication: Secondary | ICD-10-CM

## 2024-05-30 ENCOUNTER — Other Ambulatory Visit: Payer: Self-pay | Admitting: Physical Medicine & Rehabilitation

## 2024-06-02 ENCOUNTER — Other Ambulatory Visit: Payer: Self-pay | Admitting: Family Medicine

## 2024-06-02 DIAGNOSIS — E1159 Type 2 diabetes mellitus with other circulatory complications: Secondary | ICD-10-CM

## 2024-07-07 ENCOUNTER — Other Ambulatory Visit: Payer: Self-pay | Admitting: Family Medicine

## 2024-07-14 ENCOUNTER — Other Ambulatory Visit: Payer: Self-pay | Admitting: Family Medicine

## 2024-07-21 ENCOUNTER — Other Ambulatory Visit: Payer: Self-pay | Admitting: Family Medicine

## 2024-07-21 DIAGNOSIS — E1159 Type 2 diabetes mellitus with other circulatory complications: Secondary | ICD-10-CM

## 2024-07-21 DIAGNOSIS — E1169 Type 2 diabetes mellitus with other specified complication: Secondary | ICD-10-CM

## 2024-07-21 DIAGNOSIS — Z125 Encounter for screening for malignant neoplasm of prostate: Secondary | ICD-10-CM

## 2024-07-21 DIAGNOSIS — E119 Type 2 diabetes mellitus without complications: Secondary | ICD-10-CM

## 2024-07-25 ENCOUNTER — Encounter: Payer: Self-pay | Admitting: Physical Medicine & Rehabilitation

## 2024-07-25 ENCOUNTER — Encounter: Attending: Physical Medicine & Rehabilitation | Admitting: Physical Medicine & Rehabilitation

## 2024-07-25 VITALS — BP 105/73 | HR 96 | Ht 73.0 in | Wt 278.0 lb

## 2024-07-25 DIAGNOSIS — G8929 Other chronic pain: Secondary | ICD-10-CM | POA: Insufficient documentation

## 2024-07-25 DIAGNOSIS — M545 Low back pain, unspecified: Secondary | ICD-10-CM | POA: Insufficient documentation

## 2024-07-25 DIAGNOSIS — G894 Chronic pain syndrome: Secondary | ICD-10-CM | POA: Diagnosis not present

## 2024-07-25 DIAGNOSIS — Z5181 Encounter for therapeutic drug level monitoring: Secondary | ICD-10-CM | POA: Diagnosis not present

## 2024-07-25 DIAGNOSIS — M546 Pain in thoracic spine: Secondary | ICD-10-CM | POA: Insufficient documentation

## 2024-07-25 DIAGNOSIS — M542 Cervicalgia: Secondary | ICD-10-CM | POA: Insufficient documentation

## 2024-07-25 NOTE — Progress Notes (Signed)
 "  Subjective:    Patient ID: Eddie Hancock, male    DOB: [birth year], 54 y.o.   MRN: [id number]  HPI  Eddie Hancock is a 54 y.o. year old male  who  has a past medical history of Anxiety, Arthritis, Diabetes mellitus without complication (HCC), GERD (gastroesophageal reflux disease), Hyperlipidemia, Hypertension, Obesity, and Seasonal allergies.   They are presenting to PM&R clinic as a new patient for pain management evaluation. They were referred by Dr. Chandra for treatment of chronic neck pain.  Patient reports his neck pain started about 3 years ago and then became severe about 2 years ago. He reports of gravel like feeling in his neck.  Patient is well his whole spine hurts has pain in his lower back however the neck is where the pain is the worst.  Sometimes the pain will shoot into his shoulders and upper arms.  He says sometimes his arms will fall asleep during the day.  He works a very physical job [place].  His back will often be sore after work.  Patient says he does not want medications that are too strong because he does not want them to affect his focus at work.  He reports he is seeing Eddie Hancock Dr. Vernetta.  Patient says he was told he did not need surgery at that time.  Patient reports he also has had issues with pain in his left knee and left hip.  Pain in these areas comes and goes.  He also has elbow pain.  Furthermore he reports pain with his right shoulder due to the injury that he sustained as a child.  He has a history of gout and says that affects his ankles, knees and feet.  He reports history of chronic anxiety, on Zoloft .   Red flag symptoms: No red flags for back pain endorsed in Hx or ROS  Medications tried: Topical medications benzocaine- helps a little, icy hot- doesn thelp Nsaids Celebrex ,Relafen , Ibuprofen - all help about the same. He takes ibuprofen  as it is less expensive  Ibuprofen - takes 600 to 800 mg TID  Tylenol  Helps a  little, takes in the AM Opiates Denies  Gabapentin  / Lyrica - denies  TCAs - Denies  SNRIs- Denies  Robaxin  helps a little  Flexeril  will help however he will take this after work because it can be sedating for him Turmeric helps  Other treatments: PT- helps for that day only, not long term benefit  TENs unit - helps  Injections - Denies     Interval history 06/18/2023 Mr. More is here for follow-up of his chronic pain in his neck, lower back and several other joints.  Patient reports that since starting gabapentin  his neck range of motion his significantly improved.  He is noticing been able to look left and right much further than he was able to previously.  Patient says he did not notice it until asked him today but he thinks pain in his joints reported previously is also decreased from prior visits.  He did develop some lower back pain about a week ago and he thinks this occurred when he was out of the gabapentin  briefly.  He is not having any side effects with gabapentin .  Interval history 08/20/2023 [name] is here for follow-up regarding his chronic pain.  Pain is the worst in his neck and lower back.  He also has pain in other peripheral joints but this is not as severe recently.  We reviewed MRI indicating multiple levels of cervical spinal spondylosis.  Patient reports gabapentin  is helping his pain a lot most days, He will occasionally have exacerbations a couple times a month when the pain is very severe.  Interval history 12/07/23 [name] reports his neck pain and other joint pain is doing much better since he started gabapentin .  Currently also his pain is in his lower back.  He has had back pain for many years and has been gradually worsening.  It started when he pulled his back at work many years ago.  Pain is primarily axial and is not having significant issues with sciatica.  Sometimes laying on a hard surface will help his low back.  Tramadol  takes the edge off his pain, he  has been using it sparingly only when the pain is very severe.  No side effects with the medication.  Interval history 02/01/24 Patient reports neck pain continues to be improved since starting the gabapentin .  Continues to have pain in his lower back, pain is primarily axial.  Tramadol  is helping reduce his pain.  He reports he tried getting a refill for this and had an issue, he thought it was declined.  This does help his back pain especially takes 100 mg dose.  No side effects of the medication.  Interval history 04/25/24 Reports new lower/mid-thoracic back pain for the last couple of weeks. Describes it as a constant ache with sharp, stabbing pain on twisting movements, such as getting out of bed. Rates pain as significant. The new pain is located higher than prior pain he had around his L spine.  Neck pain is significantly improved and is no longer a primary concern. Reports nearly full range of motion with some tightness on extreme rotation. Lower back pain is also improved and feels more under control.  Reports that home exercises from the last visit did not provide significant relief, though stretching helps. Finds some relief by lying flat on their back for 10 minutes after work.  Medications Current medication regimen provides reasonable control, aside from the new mid-back pain. - Tramadol : Reports it helps with function by reducing the perception of pain without causing sedation or cognitive impairment. Uses 1-2 tablets PRN, sometimes every 6 hours on weekends, but stays within the prescribed maximum of 4 tablets per day. - Gabapentin : Continues use. - Flexeril : Continues use PRN at night. - TENS unit: Continues to use with some benefit. - NSAIDs: Currently using naproxen but feels it is not very effective and plans to switch back to ibuprofen . Reports taking Tylenol Arthritis in the mornings.  Inquired about using meloxicam  with tramadol .  Prior UDS results: No results found for:  LABOPIA, COCAINSCRNUR, LABBENZ, AMPHETMU, THCU, LABBARB   Interval history 07/25/2024 Reports overall pain is tolerable, rated 4-6/10. Notes a decrease in sharp, injury-like pain since stopping work-he was laid off, but an increase in aching pain due to reduced activity. Pain location and be migratory, shifting between the neck, mid-back, and lower back. Today, the pain is primarily in the lower back, more so on the right side, with some radiation into the right buttock.  The lower right back is generally one of the worst areas of his pain.  Reports intermittent numbness in fingers, but no constant shooting pain down the arms. Mobility is maintained.  Medications - Gabapentin : Reports it is helping. - Meloxicam : Takes one in the morning and one at night. Reports it is stronger than ibuprofen  and seems to be working well  in combination with Gabapentin . Will get a refill now that Medicaid is active. - Tramadol : Takes as needed for pain, usually two in the morning. Reports it is effective and can provide relief for the entire day. - Lidocaine  patches: Tried but felt they were not effective, possibly not penetrating deeply enough. - TENS unit: Continues to use.   Previously saw an orthopedic specialist (Dr. Lonni Poli)    Pain Inventory Average Pain 4 Pain Right Now 6 My pain is constant, sharp, and dull  In the last 24 hours, has pain interfered with the following? General activity 6 Relation with others 0 Enjoyment of life 6 What TIME of day is your pain at its worst? morning , daytime, evening, and night Sleep (in general) Good  Pain is worse with: bending and standing Pain improves with: rest, heat/ice, and TENS Relief from Meds: 5    Family History  Problem Relation Age of Onset   Hypertension Mother    Learning disabilities Maternal Grandmother    Learning disabilities Maternal Grandfather    Diabetes Daughter    Colon polyps Neg Hx    Colon cancer Neg  Hx    Esophageal cancer Neg Hx    Stomach cancer Neg Hx    Rectal cancer Neg Hx    Social History   Socioeconomic History   Marital status: Married    Spouse name: Not on file   Number of children: Not on file   Years of education: Not on file   Highest education level: Not on file  Occupational History   Not on file  Tobacco Use   Smoking status: Former    Current packs/day: 0.00    Types: Cigarettes    Quit date: 11/07/2016    Years since quitting: 7.7    Passive exposure: Never   Smokeless tobacco: Never  Vaping Use   Vaping status: Never Used  Substance and Sexual Activity   Alcohol use: Yes    Comment: rare   Drug use: No   Sexual activity: Not Currently  Other Topics Concern   Not on file  Social History Narrative   Not on file   Social Drivers of Health   Tobacco Use: Medium Risk (04/25/2024)   Patient History    Smoking Tobacco Use: Former    Smokeless Tobacco Use: Never    Passive Exposure: Never  Programmer, 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 [employer] Insecurity: Not on file  Transportation Needs: Not on file  Physical Activity: Not on file  Stress: Not on file  Social Connections: Not on file  Depression (PHQ2-9): Low Risk (04/25/2024)   Depression (PHQ2-9)    PHQ-2 Score: 0  Recent Concern: Depression (PHQ2-9) - Medium Risk (03/03/2024)   Depression (PHQ2-9)    PHQ-2 Score: 5  Alcohol Screen: Not on file  Housing: Not on file  Utilities: Not on file  Health Literacy: Not on file   Past Surgical History:  Procedure Laterality Date   APPENDECTOMY     Past Medical History:  Diagnosis Date   Anxiety    situational   Arthritis    neck/spine   Diabetes mellitus without complication (HCC)    on meds   GERD (gastroesophageal reflux disease)    on meds   Hyperlipidemia    not on meds at this time (11/10/2021)   Hypertension    on meds   Obesity    Seasonal allergies    There were no vitals taken for this visit.  Opioid  Risk Score:   Fall Risk  Score:  `1  Depression screen [facility] 2/9     04/25/2024    2:59 PM 03/03/2024    2:40 PM 02/01/2024    1:56 PM 12/07/2023    1:47 PM 11/26/2023   10:17 AM 08/27/2023    8:14 AM 08/20/2023    9:35 AM  Depression screen PHQ 2/9  Decreased Interest 0 1 0 0 0 0 0  Down, Depressed, Hopeless 0 2 0 0 0 0 0  PHQ - 2 Score 0 3 0 0 0 0 0  Altered sleeping  1   0 0   Tired, decreased energy  1   2 0   Change in appetite  0   0 0   Feeling bad or failure about yourself   0   0 0   Trouble concentrating  0   0 0   Moving slowly or fidgety/restless  0   0 0   Suicidal thoughts  0   0 0   PHQ-9 Score  5    2  0    Difficult doing work/chores  Somewhat difficult    Not difficult at all      Data saved with a previous flowsheet row definition      Review of Systems  Constitutional: Negative.   HENT: Negative.    Respiratory: Negative.    Cardiovascular: Negative.   Gastrointestinal: Negative.   Endocrine: Negative.   Genitourinary: Negative.   Musculoskeletal:  Positive for back pain.       Right shoulder pain Left hip pain Leg knee pain   Skin: Negative.   Allergic/Immunologic: Negative.   Hematological: Negative.   Psychiatric/Behavioral: Negative.    All other systems reviewed and are negative.      Objective:   Physical Exam  Gen: no distress, normal appearing HEENT: oral mucosa pink and moist, NCAT Chest: normal effort, normal rate of breathing Abd: soft, non-distended Ext: no edema Psych: pleasant, normal affect Skin: No skin breakdown noted in visible portion Neuro:  No focal motor deficits Sensory exam normal for light touch and pain in all 4 limbs. No limb ataxia or cerebellar signs. No abnormal tone appreciated.   Musculoskeletal:  Mild neck and parascapular muscle tenderness today.  Tenderness mid-thoracic spine improved today from last visit. Greatest tenderness appears in the right lower lumbar spine.  Milder tenderness in left lower lumbar spine. Axial loading  through the shoulders reproduces the right lower lumbar pain. Slump test negative bilaterally  C spine xray 11/18/20 2 views of the cervical spine shows significant arthritic changes of the  lower cervical spine with several levels of significant degenerative disc  disease and particular osteophytes.  There is also loss of the cervical  lordosis of the lower level.   MRI Cpine 06/09/23  FINDINGS: Alignment: No static listhesis. Loss of the normal cervical lordosis with straightening.   Vertebrae: No acute fracture, evidence of discitis, or aggressive bone lesion.   Cord: Normal signal and morphology.   Posterior Fossa, vertebral arteries, paraspinal tissues: Posterior fossa demonstrates no focal abnormality. Vertebral artery flow voids are maintained. Paraspinal soft tissues are unremarkable.   Disc levels:   Discs: Severe disc height loss at C6-7 and C7-T1. Mild disc height loss at T1-2.   C2-3: No significant disc bulge. No neural foraminal stenosis. No central canal stenosis.   C3-4: No disc protrusion. Mild bilateral foraminal narrowing. No central canal stenosis.   C4-5: No disc protrusion. Moderate  bilateral foraminal stenosis. No central canal stenosis.   C5-6: Mild disc bulge. Severe left facet arthropathy. Bilateral uncovertebral degenerative changes. Moderate right foraminal stenosis. Severe left foraminal stenosis.   C6-7: No disc protrusion. Bilateral uncovertebral degenerative changes. Severe bilateral foraminal stenosis. No central canal stenosis.   C7-T1: Mild disc bulge. Bilateral uncovertebral degenerative changes. Severe bilateral foraminal stenosis. No central canal stenosis.   T1-2: Broad disc bulge. Bilateral uncovertebral degenerative changes. Moderate bilateral foraminal narrowing.   IMPRESSION: 1. Diffuse cervical spine spondylosis as described above. 2. No acute osseous injury of the cervical spine.   12/07/2023 lumbar spine  x-ray FINDINGS: Mild retrolisthesis of L2 on L3 and L3 on L4. Extensive multilevel degenerative disc disease of the lower thoracic and lumbar spine most pronounced L2-3, L3-4 and L5-S1. Lower lumbar spine facet degenerative changes. SI joints are unremarkable.   IMPRESSION: Extensive multilevel degenerative disc and facet disease.     Assessment & Plan    1) Chronic neck pain, with radicular pain into b/l scapula and upper arm.  Patient reports this is the primary cause of his pain at this time.  -C-spine MRI with multilevel spondylosis, multilevel bilateral foraminal stenosis, C5-6 severe left facet arthropathy  - Neck pain has improved since starting gabapentin  2) Hx depression/anxiety on sertraline  3) Gout, on allopurinol   4) Pain in the R shoulder, R elbow, L hip, L knee pain.  Suspect this may be related to OA.  Pain in these joints is doing better 5) Chronic lower back pain  - Suspect may be facet joint mediated-x-rays indicate disc and facet joint degenerative changes 6)New-onset mid-thoracic back pain, suspicious for arthritic changes  Plan -Continue gabapentin  to 600 mg 3 times daily -Patient reports he had limited benefit with physical therapy in the past -Provided list of home exercises to complete 3 times a week- pt reports minimal benefit after trying for 6 weeks -Consider Duloxitine , would likely need to DC sertraline  first  -Foods for pain list -Continue tumaric -Continue UDS and pill counts.  Continue PDMP monitoring.  Pain contract completed prior visit. -Discussed bringing pill bottle with any medications to appointments - Refer MBB/RFA L4-5, L5-S1 Dr. Carilyn, discussed procedure with patient - Imaging: X-ray of the thoracic spine to evaluate the new area of pain. Sent order to [facility].  Patient plans to complete, this is delayed due to previously not having insurance. - Continue Tramadol  50 mg, 1-2 tablets PO BID PRN.  - Continue meloxicam  7.5 mg  daily PRN.  Advised to use only one NSAID at a time and to use them sparingly. Counseled on risks of NSAIDs (kidney injury, GI bleed, cardiovascular events), especially in combination with Zoloft . - Continue gabapentin  and Flexeril  as before.    "

## 2024-07-28 ENCOUNTER — Other Ambulatory Visit

## 2024-07-28 DIAGNOSIS — E119 Type 2 diabetes mellitus without complications: Secondary | ICD-10-CM

## 2024-07-28 DIAGNOSIS — E1169 Type 2 diabetes mellitus with other specified complication: Secondary | ICD-10-CM

## 2024-07-28 DIAGNOSIS — Z125 Encounter for screening for malignant neoplasm of prostate: Secondary | ICD-10-CM

## 2024-07-28 DIAGNOSIS — E1159 Type 2 diabetes mellitus with other circulatory complications: Secondary | ICD-10-CM

## 2024-07-29 ENCOUNTER — Ambulatory Visit: Payer: Self-pay

## 2024-07-29 LAB — COMPREHENSIVE METABOLIC PANEL WITH GFR
ALT: 23 IU/L (ref 0–44)
AST: 20 IU/L (ref 0–40)
Albumin: 4.6 g/dL (ref 3.8–4.9)
Alkaline Phosphatase: 88 IU/L (ref 47–123)
BUN/Creatinine Ratio: 12 (ref 9–20)
BUN: 14 mg/dL (ref 6–24)
Bilirubin Total: 0.7 mg/dL (ref 0.0–1.2)
CO2: 21 mmol/L (ref 20–29)
Calcium: 10 mg/dL (ref 8.7–10.2)
Chloride: 99 mmol/L (ref 96–106)
Creatinine, Ser: 1.13 mg/dL (ref 0.76–1.27)
Globulin, Total: 2.1 g/dL (ref 1.5–4.5)
Glucose: 155 mg/dL — ABNORMAL HIGH (ref 70–99)
Potassium: 5.3 mmol/L — ABNORMAL HIGH (ref 3.5–5.2)
Sodium: 138 mmol/L (ref 134–144)
Total Protein: 6.7 g/dL (ref 6.0–8.5)
eGFR: 78 mL/min/1.73

## 2024-07-29 LAB — PSA: Prostate Specific Ag, Serum: 0.8 ng/mL (ref 0.0–4.0)

## 2024-07-29 LAB — LIPID PANEL
Chol/HDL Ratio: 4.9 ratio (ref 0.0–5.0)
Cholesterol, Total: 181 mg/dL (ref 100–199)
HDL: 37 mg/dL — ABNORMAL LOW
LDL Chol Calc (NIH): 71 mg/dL (ref 0–99)
Triglycerides: 467 mg/dL — ABNORMAL HIGH (ref 0–149)
VLDL Cholesterol Cal: 73 mg/dL — ABNORMAL HIGH (ref 5–40)

## 2024-07-29 LAB — CBC WITH DIFFERENTIAL/PLATELET
Basophils Absolute: 0.1 x10E3/uL (ref 0.0–0.2)
Basos: 1 %
EOS (ABSOLUTE): 0.3 x10E3/uL (ref 0.0–0.4)
Eos: 4 %
Hematocrit: 50.8 % (ref 37.5–51.0)
Hemoglobin: 17.1 g/dL (ref 13.0–17.7)
Immature Grans (Abs): 0.1 x10E3/uL (ref 0.0–0.1)
Immature Granulocytes: 1 %
Lymphocytes Absolute: 2.1 x10E3/uL (ref 0.7–3.1)
Lymphs: 28 %
MCH: 30.1 pg (ref 26.6–33.0)
MCHC: 33.7 g/dL (ref 31.5–35.7)
MCV: 89 fL (ref 79–97)
Monocytes Absolute: 0.7 x10E3/uL (ref 0.1–0.9)
Monocytes: 9 %
Neutrophils Absolute: 4.4 x10E3/uL (ref 1.4–7.0)
Neutrophils: 57 %
Platelets: 265 x10E3/uL (ref 150–450)
RBC: 5.69 x10E6/uL (ref 4.14–5.80)
RDW: 13 % (ref 11.6–15.4)
WBC: 7.5 x10E3/uL (ref 3.4–10.8)

## 2024-07-29 LAB — HEMOGLOBIN A1C
Est. average glucose Bld gHb Est-mCnc: 140 mg/dL
Hgb A1c MFr Bld: 6.5 % — ABNORMAL HIGH (ref 4.8–5.6)

## 2024-08-04 ENCOUNTER — Encounter: Admitting: Family Medicine

## 2024-08-06 LAB — HM DIABETES EYE EXAM

## 2024-08-07 ENCOUNTER — Other Ambulatory Visit: Payer: Self-pay | Admitting: Physical Medicine & Rehabilitation

## 2024-08-07 ENCOUNTER — Other Ambulatory Visit: Payer: Self-pay | Admitting: Family Medicine

## 2024-08-07 DIAGNOSIS — E118 Type 2 diabetes mellitus with unspecified complications: Secondary | ICD-10-CM

## 2024-08-12 ENCOUNTER — Other Ambulatory Visit: Payer: Self-pay | Admitting: Physical Medicine & Rehabilitation

## 2024-08-13 ENCOUNTER — Encounter: Payer: Self-pay | Admitting: Family Medicine

## 2024-08-13 ENCOUNTER — Ambulatory Visit: Admitting: Family Medicine

## 2024-08-13 MED ORDER — ROSUVASTATIN CALCIUM 10 MG PO TABS: 10.0000 mg | ORAL_TABLET | Freq: Every day | ORAL | 3 refills | Status: AC

## 2024-08-13 MED ORDER — OLMESARTAN MEDOXOMIL 20 MG PO TABS: 20.0000 mg | ORAL_TABLET | Freq: Every day | ORAL | 1 refills | Status: AC

## 2024-08-13 NOTE — Patient Instructions (Signed)
" ° °  YOUR PLAN: TYPE 2 DIABETES MELLITUS: Your diabetes management was reviewed, and we discussed resuming your medication. -Resume Mounjaro  at 5 mg. -Continue taking metformin  and Jardiance . -Follow up in 6 months for diabetes management.  HYPERTENSION: We addressed your blood pressure management and the potential impact of your current medication on potassium levels. -Switch from lisinopril  to olmesartan  to potentially reduce potassium elevation. -Recheck potassium levels after medication switch.  HYPERLIPIDEMIA: We discussed your high triglycerides and previous medication intolerance, and adjusted your cholesterol medication. -Increase Crestor  to 10 mg. -Monitor for muscle aches or other side effects. -If muscle aches occur, discontinue Zetia  first before adjusting Crestor . -Follow up in 3 months for cholesterol management.  CHRONIC PAIN: Your pain management was reviewed and found to be improving. -Continue taking gabapentin  for pain management.    "

## 2024-08-13 NOTE — Progress Notes (Unsigned)
" ° ° °  Subjective   Patient ID: Eddie Hancock, male    DOB: [birth year]  Age: 54 y.o. MRN: [id number]  Chief Complaint  Patient presents with   Annual Exam    Discussed the use of AI scribe software for clinical note transcription with the patient, who gave verbal consent to proceed.  History of Present Illness   Eddie Hancock is a 54 year old male with hypertension and diabetes who presents for routine follow-up.  He reports no new symptoms. Recent labs showed elevated triglycerides and borderline potassium. Recent blood pressure readings have been stable around 105-119/60-79. He has been off Mounjaro  for 7-8 weeks due to loss of insurance after job loss in November. For cholesterol, he was fasting at his last lab draw. He previously tried fenofibrate, which caused significant side effects and missed work, and has not tried a higher Crestor  dose. 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          The 10-year ASCVD risk score (Arnett DK, et al., 2019) is: 9.7%  Health Maintenance Due  Topic Date Due   HIV Screening  Never done   Hepatitis C Screening  Never done   Hepatitis B Vaccines 19-59 Average Risk (1 of 3 - 19+ 3-dose series) Never done   OPHTHALMOLOGY EXAM  09/03/2018   COVID-19 Vaccine (4 - 2025-26 season) 03/10/2024      Objective:     BP 119/79   Pulse (!) 107   Ht 6' 1 (1.854 m)   Wt 279 lb 1.9 oz (126.6 kg)   SpO2 97%   BMI 36.83 kg/m  {Vitals History (Optional):23777}  Physical Exam   VITALS: BP- 119/79 HEENT: Eyes normal CHEST: Lungs clear to auscultation bilaterally        No results found for any visits on 08/13/24.      Assessment & Plan:   There are no diagnoses linked to this encounter.  Assessment and Plan    Type 2 diabetes mellitus Plans to resume Mounjaro  at 5 mg. - Resume Mounjaro  at 5 mg. - Continue metformin  and Jardiance . -  Scheduled follow-up in 6 months for diabetes management.  Hypertension Lisinopril  may elevate potassium levels. - Switched lisinopril  to olmesartan  to potentially reduce potassium elevation. - Will recheck potassium levels after medication switch.  Hyperlipidemia Elevated triglycerides. Previous intolerance to fenofibrate. Plan to increase Crestor  to 10 mg and monitor for side effects. - Increased Crestor  to 10 mg. - Instructed to monitor for muscle aches or other side effects. - If muscle aches occur, discontinue Zetia  first before adjusting Crestor . - Scheduled follow-up in 3 months for cholesterol management.  Chronic pain Reports improvement with recent adjustments. - Continue gabapentin  for pain management.        Return in about 6 months (around 02/10/2025) for DM.    Toribio MARLA Slain, MD  "

## 2024-09-05 ENCOUNTER — Encounter: Admitting: Physical Medicine & Rehabilitation

## 2024-09-10 ENCOUNTER — Other Ambulatory Visit

## 2024-09-26 ENCOUNTER — Encounter: Admitting: Physical Medicine & Rehabilitation

## 2024-11-10 ENCOUNTER — Other Ambulatory Visit

## 2025-02-10 ENCOUNTER — Ambulatory Visit: Admitting: Family Medicine
